# Patient Record
Sex: Male | Born: 1935 | Race: White | Hispanic: No | Marital: Married | Smoking: Former smoker
Health system: Southern US, Community
[De-identification: ages and names within clinical notes are randomized; demographics above are authoritative.]

## PROBLEM LIST (undated history)

## (undated) DIAGNOSIS — I714 Abdominal aortic aneurysm, without rupture, unspecified: Secondary | ICD-10-CM

## (undated) DIAGNOSIS — I10 Essential (primary) hypertension: Secondary | ICD-10-CM

## (undated) DIAGNOSIS — M109 Gout, unspecified: Secondary | ICD-10-CM

## (undated) DIAGNOSIS — E119 Type 2 diabetes mellitus without complications: Secondary | ICD-10-CM

## (undated) DIAGNOSIS — I2510 Atherosclerotic heart disease of native coronary artery without angina pectoris: Secondary | ICD-10-CM

## (undated) DIAGNOSIS — E785 Hyperlipidemia, unspecified: Secondary | ICD-10-CM

## (undated) DIAGNOSIS — I471 Supraventricular tachycardia, unspecified: Secondary | ICD-10-CM

## (undated) DIAGNOSIS — M609 Myositis, unspecified: Secondary | ICD-10-CM

## (undated) DIAGNOSIS — M810 Age-related osteoporosis without current pathological fracture: Secondary | ICD-10-CM

## (undated) DIAGNOSIS — M1990 Unspecified osteoarthritis, unspecified site: Secondary | ICD-10-CM

## (undated) HISTORY — DX: Gout, unspecified: M10.9

## (undated) HISTORY — PX: PROSTATE SURGERY: SHX751

## (undated) HISTORY — DX: Unspecified osteoarthritis, unspecified site: M19.90

## (undated) HISTORY — PX: CARDIAC SURGERY: SHX584

## (undated) HISTORY — DX: Age-related osteoporosis without current pathological fracture: M81.0

## (undated) HISTORY — DX: Supraventricular tachycardia: I47.1

## (undated) HISTORY — DX: Supraventricular tachycardia, unspecified: I47.10

## (undated) HISTORY — DX: Myositis, unspecified: M60.9

## (undated) HISTORY — PX: CORONARY STENT PLACEMENT: SHX1402

## (undated) HISTORY — DX: Hyperlipidemia, unspecified: E78.5

---

## 2004-07-04 ENCOUNTER — Inpatient Hospital Stay (HOSPITAL_COMMUNITY): Admission: AD | Admit: 2004-07-04 | Discharge: 2004-07-07 | Payer: Self-pay | Admitting: Internal Medicine

## 2004-07-04 ENCOUNTER — Ambulatory Visit: Payer: Self-pay | Admitting: Internal Medicine

## 2004-07-04 ENCOUNTER — Encounter: Payer: Self-pay | Admitting: Cardiology

## 2006-04-03 ENCOUNTER — Ambulatory Visit: Payer: Self-pay | Admitting: Cardiology

## 2006-04-03 ENCOUNTER — Inpatient Hospital Stay (HOSPITAL_COMMUNITY): Admission: AD | Admit: 2006-04-03 | Discharge: 2006-04-04 | Payer: Self-pay | Admitting: Cardiology

## 2007-04-08 ENCOUNTER — Encounter: Admission: RE | Admit: 2007-04-08 | Discharge: 2007-04-08 | Payer: Self-pay | Admitting: Specialist

## 2009-04-30 ENCOUNTER — Inpatient Hospital Stay (HOSPITAL_COMMUNITY): Admission: EM | Admit: 2009-04-30 | Discharge: 2009-05-03 | Payer: Self-pay | Admitting: Internal Medicine

## 2009-04-30 ENCOUNTER — Ambulatory Visit: Payer: Self-pay | Admitting: Internal Medicine

## 2009-06-02 ENCOUNTER — Ambulatory Visit: Payer: Self-pay | Admitting: Cardiovascular Disease

## 2009-08-04 ENCOUNTER — Ambulatory Visit: Payer: Self-pay | Admitting: Cardiovascular Disease

## 2009-10-23 ENCOUNTER — Encounter (INDEPENDENT_AMBULATORY_CARE_PROVIDER_SITE_OTHER): Payer: Self-pay | Admitting: *Deleted

## 2009-10-24 ENCOUNTER — Ambulatory Visit: Payer: Self-pay | Admitting: Cardiovascular Disease

## 2010-02-22 ENCOUNTER — Encounter: Payer: Self-pay | Admitting: Cardiovascular Disease

## 2010-04-19 ENCOUNTER — Ambulatory Visit: Payer: Self-pay | Admitting: Cardiovascular Disease

## 2010-08-29 NOTE — Miscellaneous (Signed)
Summary: Enrollment  Enrollment   Imported By: Marylou Mccoy 02/22/2010 12:39:54  _____________________________________________________________________  External Attachment:    Type:   Image     Comment:   External Document

## 2010-08-29 NOTE — Miscellaneous (Signed)
Summary: research update  Clinical Lists Changes  Observations: Added new observation of RS STUDY: SOLID TIMI 52 (10/23/2009 14:16) Added new observation of RESEARCHCAND: Cardiology (10/23/2009 14:16)      Research Study Name: SOLID TIMI 52

## 2010-10-12 ENCOUNTER — Encounter (INDEPENDENT_AMBULATORY_CARE_PROVIDER_SITE_OTHER): Payer: Self-pay

## 2010-10-12 DIAGNOSIS — R0989 Other specified symptoms and signs involving the circulatory and respiratory systems: Secondary | ICD-10-CM

## 2010-11-02 LAB — CARDIAC PANEL(CRET KIN+CKTOT+MB+TROPI)
CK, MB: 7.3 ng/mL — ABNORMAL HIGH (ref 0.3–4.0)
Relative Index: 5.6 — ABNORMAL HIGH (ref 0.0–2.5)
Total CK: 131 U/L (ref 7–232)
Total CK: 134 U/L (ref 7–232)
Troponin I: 0.03 ng/mL (ref 0.00–0.06)
Troponin I: 0.04 ng/mL (ref 0.00–0.06)

## 2010-11-02 LAB — CBC
HCT: 35.9 % — ABNORMAL LOW (ref 39.0–52.0)
HCT: 37.2 % — ABNORMAL LOW (ref 39.0–52.0)
HCT: 38.5 % — ABNORMAL LOW (ref 39.0–52.0)
Hemoglobin: 12.6 g/dL — ABNORMAL LOW (ref 13.0–17.0)
Hemoglobin: 12.8 g/dL — ABNORMAL LOW (ref 13.0–17.0)
Hemoglobin: 13.2 g/dL (ref 13.0–17.0)
Hemoglobin: 13.4 g/dL (ref 13.0–17.0)
MCHC: 34.5 g/dL (ref 30.0–36.0)
MCHC: 34.7 g/dL (ref 30.0–36.0)
MCHC: 35 g/dL (ref 30.0–36.0)
MCV: 100.3 fL — ABNORMAL HIGH (ref 78.0–100.0)
MCV: 100.6 fL — ABNORMAL HIGH (ref 78.0–100.0)
MCV: 99.9 fL (ref 78.0–100.0)
Platelets: 187 10*3/uL (ref 150–400)
Platelets: 199 10*3/uL (ref 150–400)
RBC: 3.57 MIL/uL — ABNORMAL LOW (ref 4.22–5.81)
RBC: 3.7 MIL/uL — ABNORMAL LOW (ref 4.22–5.81)
RBC: 3.79 MIL/uL — ABNORMAL LOW (ref 4.22–5.81)
RBC: 3.86 MIL/uL — ABNORMAL LOW (ref 4.22–5.81)
RDW: 12.1 % (ref 11.5–15.5)
RDW: 12.5 % (ref 11.5–15.5)
WBC: 6.3 10*3/uL (ref 4.0–10.5)
WBC: 6.8 10*3/uL (ref 4.0–10.5)
WBC: 7.1 10*3/uL (ref 4.0–10.5)
WBC: 8.3 10*3/uL (ref 4.0–10.5)

## 2010-11-02 LAB — HEMOGLOBIN A1C
Hgb A1c MFr Bld: 6.1 % (ref 4.6–6.1)
Mean Plasma Glucose: 128 mg/dL

## 2010-11-02 LAB — BASIC METABOLIC PANEL
BUN: 13 mg/dL (ref 6–23)
BUN: 13 mg/dL (ref 6–23)
CO2: 24 mEq/L (ref 19–32)
CO2: 25 mEq/L (ref 19–32)
CO2: 25 mEq/L (ref 19–32)
CO2: 26 mEq/L (ref 19–32)
Calcium: 9.1 mg/dL (ref 8.4–10.5)
Calcium: 9.2 mg/dL (ref 8.4–10.5)
Calcium: 9.3 mg/dL (ref 8.4–10.5)
Chloride: 105 mEq/L (ref 96–112)
Chloride: 105 mEq/L (ref 96–112)
Chloride: 106 mEq/L (ref 96–112)
Chloride: 109 mEq/L (ref 96–112)
Creatinine, Ser: 0.79 mg/dL (ref 0.4–1.5)
Creatinine, Ser: 0.83 mg/dL (ref 0.4–1.5)
GFR calc Af Amer: >60 mL/min (ref >60)
GFR calc Af Amer: >60 mL/min (ref >60)
GFR calc Af Amer: >60 mL/min (ref >60)
GFR calc non Af Amer: >60 mL/min (ref >60)
GFR calc non Af Amer: >60 mL/min (ref >60)
Glucose, Bld: 124 mg/dL — ABNORMAL HIGH (ref 70–99)
Glucose, Bld: 147 mg/dL — ABNORMAL HIGH (ref 70–99)
Glucose, Bld: 199 mg/dL — ABNORMAL HIGH (ref 70–99)
Potassium: 3.6 mEq/L (ref 3.5–5.1)
Potassium: 3.8 mEq/L (ref 3.5–5.1)
Potassium: 3.9 mEq/L (ref 3.5–5.1)
Sodium: 137 mEq/L (ref 135–145)
Sodium: 137 mEq/L (ref 135–145)
Sodium: 140 mEq/L (ref 135–145)

## 2010-11-02 LAB — LIPID PANEL
Cholesterol: 109 mg/dL (ref 0–200)
HDL: 23 mg/dL — ABNORMAL LOW (ref >39)
LDL Cholesterol: 62 mg/dL (ref 0–99)
Total CHOL/HDL Ratio: 4.7 RATIO
Triglycerides: 121 mg/dL (ref <150)

## 2010-11-02 LAB — HEPARIN LEVEL (UNFRACTIONATED)
Heparin Unfractionated: 0.49 IU/mL (ref 0.30–0.70)
Heparin Unfractionated: 0.56 IU/mL (ref 0.30–0.70)

## 2010-11-02 LAB — CK TOTAL AND CKMB (NOT AT ARMC)
CK, MB: 14.1 ng/mL — ABNORMAL HIGH (ref 0.3–4.0)
CK, MB: 17.8 ng/mL — ABNORMAL HIGH (ref 0.3–4.0)
Relative Index: 5.5 — ABNORMAL HIGH (ref 0.0–2.5)
Total CK: 323 U/L — ABNORMAL HIGH (ref 7–232)

## 2010-11-02 LAB — TROPONIN I: Troponin I: 0.01 ng/mL (ref 0.00–0.06)

## 2010-12-15 NOTE — Cardiovascular Report (Signed)
NAME:  BYNUM, MCCULLARS NO.:  [id number]   MEDICAL RECORD NO.:  [id number]          PATIENT TYPE:  INP   LOCATION:  2908                         FACILITY:  [facility]   PHYSICIAN:  Bevelyn Buckles. Bensimhon, MDDATE OF BIRTH:  February 21, 1935   DATE OF PROCEDURE:  04/03/2006  DATE OF DISCHARGE:                              CARDIAC CATHETERIZATION   PRIMARY CARE PHYSICIAN:  Dr. Feliciana Rossetti.   CARDIOLOGIST:  Dr. Lenard Galloway Dhatt.   PATIENT IDENTIFICATION:  Mr. Reust is a very pleasant 76 year old male  with multiple medical problems.  He has history of coronary artery disease  and underwent bypass grafting in 1988 by Dr. Particia Lather.  He was  admitted with unstable angina in December 2005 and was found to have tandem  lesions in the saphenous vein graft to the OM system.  He underwent stenting  of the ostial saphenous vein graft as well as the proximal saphenous vein  graft with a Cypher drug-eluting stents by Dr. Riley Kill.  He has done well  since that time.  He was readmitted with chest pinching similar to his  previous unstable angina.  Cardiac markers and EKG were nonrevealing.  Based  on the quality of his pain, he was brought to the catheterization lab.   PROCEDURES PERFORMED:  1. Selective coronary angiography.  2. Saphenous vein angiography.  3. LIMA angiography.  4. RIMA angiography.  5. Left heart cath.  6. Left ventriculogram.   DESCRIPTION OF PROCEDURE:  The risks and benefits of catheterization were  explained.  Consent was signed and placed on the chart.  A 6-French arterial  sheath was placed in the right femoral artery using a modified Seldinger  technique.  Standard catheters including JL-4, JR-4 and angled pigtail were  used for procedure.  All catheter exchanges made over wire.  There were no  apparent complications.  Of note, the LIMA was cannulated using the JR-4  catheter.  The RIMA was also cannulated using the JR-4 over a long exchange  wire.   Central aortic pressure 135/68 with a mean 94.  LV pressure 145/14 with an  EDP of 30.  There was no aortic stenosis.   ANGIOGRAPHY:  Left main was difficult to see.  There was significant  catheter damping with a 6-French catheter, a probable 50% ostial stenosis.   LAD had moderate diffuse disease throughout the proximal and mid-section.  It appeared to be totally occluded after the takeoff of two septal  perforators.   Left circumflex was totally occluded proximally after the takeoff of a small  ramus.  The ramus had diffuse disease with a focal 80-90% lesion and  distally.  This was unchanged from previous.  The right coronary artery was  heavily diseased proximally with focal 90% in the proximal portion and then  diffuse disease throughout the mid-section.  It was totally occluded in the  midsection after giving off an RV branch.   LIMA to the LAD was widely patent.  There was a 50% lesion in the LAD in the  distal portion after the anastomosis of the LIMA.   The saphenous vein  sequential graft to the OM-1/OM-2.  The two stents were  widely patent.  In the mid-section after the second stent, there was a focal  99% stenosis.  There was a 40% stenosis in the native OM-2 just after the  insertion of the vein graft.   The RIMA to the RCA was widely patent.  There was mild nonobstructive  disease in the distal RCA.  It gave off PDA and two small PLs.   Left ventriculogram done in the RAO position showed an EF of 60% with no  mitral regurgitation or wall motion abnormalities.   ASSESSMENT:  1. Severe native three-vessel disease as described above.  2. Left internal mammary artery to left anterior descending artery is      widely patent with 50-60% mid-left anterior descending artery lesion      after the anastomosis of the left internal mammary artery.  3. Left internal mammary artery to the right coronary artery is widely      patent.  4. The saphenous vein graft sequential to  the obtuse marginal-1/ obtuse      marginal-2 had 99% lesion in the body of the graft after the previously      placed stents which were widely patent.  5. Normal left ventricular function with an elevated left ventricular end-      diastolic pressure likely suggestive of diastolic dysfunction.   PLAN:  Will be to PCI of the saphenous vein graft to the OM system with Dr.  Samule Ohm.      Bevelyn Buckles. Bensimhon, MD  Electronically Signed     DRB/MEDQ  D:  04/03/2006  T:  04/03/2006  Job:  [id number]

## 2010-12-15 NOTE — Discharge Summary (Signed)
NAME:  Noah Mahoney, Noah Mahoney              ACCOUNT NO.:  [id number]   MEDICAL RECORD NO.:  [id number]          PATIENT TYPE:  INP   LOCATION:  6526                         FACILITY:  [facility]   PHYSICIAN:  Arvilla Meres, M.D. LHCDATE OF BIRTH:  1934-09-14   DATE OF ADMISSION:  07/04/2004  DATE OF DISCHARGE:  07/07/2004                                 DISCHARGE SUMMARY   DISCHARGE DIAGNOSIS:  1.  Non-ST elevated myocardial infarction status post cardiac      catheterization status post cardiac Cypher stents to the saphenous vein      graft, distal and mid first obtuse marginal, patent internal mammary      artery to the left anterior descending, patent right internal mammary      artery to the right coronary artery, normal left ventricle.  2.  History of coronary artery disease status post coronary artery bypass      grafting in 1986.  3.  Hypertension.  4.  Gastroesophageal reflux disease.  5.  Pre-diabetic.  6.  Diverticulosis, history of colon polyps.  7.  Benign prostatic hypertrophy with biopsies and microwave treatment of      benign prostatic hypertrophy.  8.  Questionable history of myositis, he has had multiple muscle biopsies      which have been inconclusive.   HOSPITAL COURSE:  The patient was transferred to [facility] from  [facility] for further evaluation of cardiac discomfort x several  weeks.  Echocardiogram performed on December 6 showed overall left  ventricular systolic function normal with left ventricular ejection fraction  55-65%.  The study was inaccurate for evaluation of left ventricular  regional wall motion, there was mild focal basal septal hypertrophy.  Carotid duplex also done this admission for bilateral carotid bruits with  results showing no 60-80% internal carotid artery stenosis bilateral.  Patient to cardiac catheterization lab on December 7 by Dr. Riley Kill, results  as noted above.  The patient tolerated the procedure without any  complications.  Post cath 24 hour observation.   Labs this admission initial cardiac panel on December 6 showed troponin  0.03, CK MB 9.1, index 4.9, with total CK of 187.  Urinalysis negative.  TSH  0.994.  Lipid profile fasting total cholesterol 151, triglycerides 173, HDL  24, LDL 92.  CBC with hemoglobin 14, hematocrit 39.5, platelet count  219,000.  Post cath BMP with sodium 141, potassium 3.8, BUN 15, creatinine  1.  CBC post cath showed hemoglobin 13, hematocrit 37.1, platelet count  221,000.   Dr. Gala Romney in to see the patient on the morning of December 9, patient  doing well without complaints, vital signs stable.  Lungs are clear.  Cath  site with no hematoma, negative for bruit.  Stable for discharge post  intervention.  Plans are to continue Plavix, follow up with Dr. Sherlyn Lick in  [place].  Consider Niaspan for decreased HDL.   DISPOSITION:  Home.   DISCHARGE MEDICATIONS:  Plavix 75 mg daily, coated aspirin 325 mg daily,  Lisinopril 25 mg daily, Ziac 5 mg daily, Nitrostat sublingual as needed.   DISCHARGE INSTRUCTIONS:  Activity:  No strenuous activity or driving until  see by doctor.  Diet:  Low fat, low cholesterol.  Call the office for any  groin swelling or bleeding.  Follow up Dr. Sherlyn Lick in two weeks, call his  office for appointment.  Patient discharged by Dr. Gala Romney.      [place]   MB/MEDQ  D:  07/07/2004  T:  07/07/2004  Job:  [id number]   cc:   Feliciana Rossetti, MD  [address]  [place]  [place] [address]  Fax: [phone]   Harl Bowie, M.D.  [address]  [place]  [place] [address]  Fax: [phone]

## 2010-12-15 NOTE — H&P (Signed)
NAME:  Noah Mahoney, Noah Mahoney NO.:  [id number]   MEDICAL RECORD NO.:  [id number]          PATIENT TYPE:  INP   LOCATION:  4709                         FACILITY:  [facility]   PHYSICIAN:  Arvilla Meres, M.D. LHCDATE OF BIRTH:  12/30/1934   DATE OF ADMISSION:  07/04/2004  DATE OF DISCHARGE:                                HISTORY & PHYSICAL   HISTORY OF PRESENT ILLNESS:  Noah Mahoney is a 76 year old white male who was  transferred from [facility] via [facility] for further cardiac  evaluation.  He describes several different types of chest discomfort that  have been going on for the last three weeks.  The first is exertional and he  describes it as a bilateral shoulder pinching sensation relieved with rest,  unassociated with shortness of breath, nausea, vomiting, or diaphoresis.  Last week, he feels that this pinching sensation became a lot worse to the  point he described it as a left shoulder hurting radiating into the back of  his neck and across the chest.  He recalls three particularly severe  episodes which have occurred at rest and he gives a 9 to 10 on a scale of 0  to 10.  He would relax and take deep breaths with relief within less than  five minutes.  Again, he denies any associated nausea, vomiting,  diaphoresis, or shortness of breath.  He also describes an anterior chest  burning sensation while walking on the treadmill.  The last occurrence was  three days ago relieved with rest.  He does not have any associated symptoms  with this discomfort either.  He feels that the left shoulder hurting  sensation somewhat resembles prior to bypass but is also different because  he is not having the GI symptoms.   He called his primary care physician yesterday who referred him to [facility].  At [facility], he was admitted.  His EKGs showed sinus  bradycardia, normal axis and normal intervals, some artifacts, and early R-  waves but no acute changes.   He did not have any old EKGs available for  comparison.   His labs over at [facility] show an H&H of 15.2 and 23.9 with an  elevated MCV and MCHC, platelets 288,000.  WBC is 6.6.  Sodium 139,  potassium 4.2, BUN 14, creatinine 0.9, glucose 115.  Normal LFTs.  Amylase  51, lipase 247.  PTT 32, PT 11.7.  His initial CK total is 307 with a MB of  23.7 and a relative index of 7.7.  Continued CK-MBs and relative indexes  were elevated; however, troponins were within normal limits at 0.02, 0.04,  and 0.05.   ALLERGIES:  No known drug allergies.   MEDICATIONS:  1.  Lisinopril 40 mg q.d.  2.  Bisoprolol/HCTZ 5/6.25 mg q.d.  3.  Aspirin 325 mg q.d.  4.  Vitamin B12 q.d.  5.  Herbal preparation.   TRANSFER MEDICATIONS:  1.  Enteric-coated aspirin 325 mg q.d.  2.  Lisinopril 20 mg q.d.  3.  Protonix 40 mg q.d.  4.  Nitroglycerin 1/2 inch paste q.6h.  5.  Ziac 5 mg q.d.  6.  Lopid 480 mg q.h.s.  7.  IV heparin.   PAST MEDICAL HISTORY:  Bypass surgery in 1988.  This was performed on January 18, 1987 by Dr. Delsa Grana. Wilson.  She received to the LAD, saphenous vein  graft to the OM and a ramus, and a RIMA to the RCA.  Catheterization prior  to his bypass showed a normal LV function.  His history is also notable for  hypertension for the proceeding four years.  GERD, diverticulosis with colon  polyp removal and his last colonoscopy in April 2004.  He is prediabetic.  He also has a history of BPH and he has undergone biopsies and microwave  treatment.  He has a left lipoma.  Questionable history of myositis.  He has  had multiple muscle biopsies which have been inconclusive.  He has not seen  a cardiologist that he knows of since his bypass surgery 1988.   SOCIAL HISTORY:  He resides in [place] with his wife, Britta Mccreedy.  He is  retired from [employer].  He remains very active around the house and  with yard work.  He does not have any children.  He quit smoking in 1987.  Prior to  that, he smoked two packs a day for unknown amount of years.  He  denies any alcohol, drugs, specific diet, or regular exercise program.   FAMILY HISTORY:  His mother died at the age of 43 of unknown reasons.  She  has a history of lung problems and goiter.  Father is age 16 with a history  of hypertension.  There are 12 siblings-3 brothers are deceased, 5 sisters  are deceased, 2 brothers are living, and 1 sister is living.  He is not sure  of specific medical problems.   REVIEW OF SYMPTOMS:  Notable for a 10 to 15 pound weight gain for the last 1-  2 years.  Dentures, glasses, sinus drainage, lipoma in the left occipital  region.  Questionable old bilateral hip claudication, cough with sinus  production.  Arthralgias in the back, neck, and hip.   PHYSICAL EXAMINATION:  GENERAL:  Well-developed, well-nourished, pleasant,  slightly obese white male in no apparent distress.  Wife and brother are  present.  HEENT:  Essentially unremarkable except for glasses, dentures, and a lipoma  behind the left ear.  NECK:  Supple without thyromegaly, adenopathy, or JVD.  He does have  bilateral carotid bruits.  CHEST:  Symmetrical excursion.  Lungs were clear to auscultation with  decreased breath sounds.  HEART:  Distant.  PMI was not displaced.  Regular rate and rhythm with a  normal S1 and S2.  He does have a 1-2/6 systolic murmur best appreciated at  the left sternal border.  SKIN:  Intact.  ABDOMEN:  Obese.  Bowel sounds present without organomegaly, masses, or  tenderness.  EXTREMITIES:  Negative clubbing, cyanosis, or edema.  PULSES:  Peripheral pulses are intact.  MUSCULOSKELETAL:  Unremarkable.  NEUROLOGICAL:  Intact.   IMPRESSION:  1.  Multiple sites of chest discomfort, unclear of etiology by history.      Electrocardiograms and troponins are negative for myocardial infarction;     however, abnormal CK-MBs are suspicious and concerning for angina.  2.  Hypertension.  3.   Obesity.  4.  Remote tobacco use.  5.  History of as mentioned in previous past medical history.   PLAN:  Admit the patient.  Continue him on his  transfer medications.  We  will also check fasting lipids, TSH, and a hemoglobin A1C, and remind him of  cardiac risk factor modification.  We will not begin a beta blocker with his  sinus bradycardia.  Dr. Arvilla Meres reviewed the patient's history,  spoke with and examined the patient.  He feels tha tgiven the patients pain  and elevated cardiac markers, he should undergo cardiac catheterization and  this  has been arranged for July 05, 2004.  The procedure, risks, and benefits  have been explained to the patient and his wife.  With this systolic murmur  and bilateral carotid bruits, we will obtain echocardiogram and carotid  ultrasound.      Emil   EW/MEDQ  D:  07/04/2004  T:  07/04/2004  Job:  [id number]   cc:   Feliciana Rossetti, MD  [address]  [place]  [place] [address]  Fax: [phone]   Malkiat Dhatt, M.D.  [address]  [place]  [place] [address]  Fax: [phone]   [facility]  [address]  [place]  [place] [address]  Fax: [phone]

## 2010-12-15 NOTE — Cardiovascular Report (Signed)
NAME:  Noah Mahoney, Noah Mahoney NO.:  [id number]   MEDICAL RECORD NO.:  [id number]          PATIENT TYPE:  INP   LOCATION:  2908                         FACILITY:  [facility]   PHYSICIAN:  Salvadore Farber, MD  DATE OF BIRTH:  Mar 12, 1935   DATE OF PROCEDURE:  04/03/2006  DATE OF DISCHARGE:                              CARDIAC CATHETERIZATION   PROCEDURE:  Placement of two drug-eluting stents in the mid portion of the  saphenous vein graft to the obtuse marginal using Spider Filter Embolic  Protection.   INDICATIONS:  Mr. Chouinard is a 76 year old gentleman status post coronary  artery bypass grafting in 1987.  He has had prior drug-eluting stent  placement in the vein graft to the marginal in 2005.  He now presents with  unstable angina.  Electrocardiogram demonstrated nonspecific ST-T  abnormalities.  CK-MB was elevated consistent with a prior diagnosis of  myositis.  However, troponins were normal.  He underwent diagnostic  angiography by Dr. Gala Romney.  That demonstrated a 99% stenosis in the  saphenous vein graft to the marginal.  This was outside the previously  stented area.  Both of the previously placed stents are widely patent.  I  was asked to proceed to percutaneous revascularization.   PROCEDURAL TECHNIQUE:  Informed consented had been obtained prior to the  diagnostic procedure and was confirmed prior to proceeding with the  intervention.  The patient had received 1 mg/kg of subcutaneous Lovenox 8  hours and 15 minutes prior to the beginning of the intervention.  I  therefore administered an additional bolus of 0.3 mg/kg IV.  After adequate  circulation time, we exchanged the sheath for a 7-French sheath in the right  groin.  I advanced a 7-French AL-1 guiding catheter over a wire and engaged  it in the ostium of the vein graft to the marginal.  I then advanced a  Prowater wire across the lesion into the distal portion of the graft without  difficulty.  I then  advanced the Spider Filter Wire Delivery Catheter over  this wire and deployed the Spider Filter in the distal graft.  Apposition  was confirmed by angiography.  I then directly stented the lesion using a  3.0 x 18-mm Cypher deployed at 16 atmospheres.  Repeat angiography  demonstrated a distal edge dissection.  I covered this with an overlapping  3.0 x 13-mm Cypher deployed at 14 atmospheres.  I then post dilated the  distal portion of the stent using a 3.25 x 18-mm PowerSail at 16 atmospheres  at the distal margin, 18 atmospheres throughout the mid portion of the stent  including the region of overlap and 16 atmospheres at the proximal margin.  I then removed the Spider Filter without difficulty.  Repeat angiography  demonstrated no residual stenosis, no residual dissection, no embolization,  and TIMI 3 flow to the distal vasculature.  The patient tolerated the  procedure well and was transferred to the holding room in stable condition.   COMPLICATIONS:  None.   IMPRESSION/PLAN:  Successful revascularization of the vein graft to the  marginal using 2 drug-eluting stents  and Spider Filter Embolic Protection.  The patient will be maintained on both aspirin and Plavix indefinitely given  his multiple drug-eluting stents.      Salvadore Farber, MD  Electronically Signed     WED/MEDQ  D:  04/03/2006  T:  04/04/2006  Job:  [id number]   cc:   Feliciana Rossetti, MD  Harl Bowie, M.D.

## 2010-12-15 NOTE — Cardiovascular Report (Signed)
NAME:  Noah Mahoney, Noah Mahoney              ACCOUNT NO.:  [id number]   MEDICAL RECORD NO.:  [id number]          PATIENT TYPE:  INP   LOCATION:  6526                         FACILITY:  [facility]   PHYSICIAN:  Noah Mahoney, M.D. [facility] BIRTH:  [birth year]   DATE OF PROCEDURE:  DATE OF DISCHARGE:  07/07/2004                              CARDIAC CATHETERIZATION   INDICATIONS:  Noah Mahoney is a 76 year old gentleman who in 1988 underwent  coronary artery bypass graft surgery by Noah Mahoney.  At that time he  had an internal mammary to the LAD, saphenous vein graft to the OM and  intermediate, and right internal mammary to the right coronary artery.  He  has done well, but presented with a non ST elevation MI.  He was referred to  the catheterization laboratory for further evaluation and treatment.   PROCEDURE:  1.  Left heart catheterization.  2.  Selective coronary arteriography.  3.  Selective left ventriculography.  4.  Saphenous vein graft angiography.  5.  Selective left internal mammary angiography.  6.  Subclavian right angiography.  7.  Percutaneous stenting of the saphenous vein graft to the intermediate      and obtuse marginal.   DESCRIPTION OF PROCEDURE:  The patient was brought to the catheterization  laboratory and prepped and draped in the usual fashion.  Through an anterior  puncture the femoral artery was entered.  Views of the left and right  coronary arteries were obtained in multiple angiographic projections.  Vein  graft and internal mammary angiography of the left internal mammary was  performed without complication.  We had some difficulty actually getting  into the right internal mammary and multiple attempts were made to try to  engage this.  The patient had some ongoing chest pain and the study had  demonstrated that he had high grade stenosis in the saphenous vein graft to  the intermediate and OM.  There was also fairly vigorous flow down the right  internal mammary into the distal vessel and this was well seen, although we  could not see good details of the distal right coronary circulation.  Decision was made to proceed on to percutaneous intervention.  The patient  was given bivalirudin in appropriate dosing.  A filter wire was employed  with a JR4 guiding catheter with side holes.  We were able to get the filter  wire into the distal vein graft.  The high grade 95% stenosis was stented  using a 3.0 x 18 Cypher drug-eluting stent at the ostium or just inside the  ostium.  The 50-70% area of irregularity at the ostium was stented using a  3.0 x 13 Cypher drug-eluting stent.  He tolerated both of these well.  We  subsequently dilated using a 3.5 x 8 Quantum and both of the stents were  post dilated because they appeared to be slightly under sized.  There was  marked improvement in the appearance of the artery but there was no evidence  of no or slow flow during the course of the procedure.  The recovery  catheter was used  to remove the filter wire without complication.  We then  went back in with a guiding catheter to try to take additional views of the  right internal mammary artery but subsequently this was abandoned after a  couple of images.  The patient tolerated the procedure well and there were  no complications.   HEMODYNAMIC DATA:  1.  Central aortic pressure 169/74.  2.  Left ventricular pressure 184/29.  3.  There did not appear to be a significant gradient on pullback across the      aortic valve but there was variation in the pressure during a pullback.   ANGIOGRAPHIC DATA:  1.  Ventriculography in the RAO projection reveals vigorous global systolic      function.  No definite wall motion abnormalities were seen.  2.  The left main coronary is free of critical disease.  3.  The LAD is severely diseased, although is not well seen distally.  4.  The internal mammary to the distal left anterior descending artery is       widely patent.  5.  There is a tiny ramus intermedius that has 90% mid stenosis and is      severely diseased.  The AV circumflex is totally occluded.  6.  The saphenous vein graft two branches of the circumflex which represent      the intermediate and OM on the original report demonstrates a 50-70%      irregular area just inside the ostium and then a 90-95% stenosis      approximately 30 mm forward.  Following percutaneous stenting both of      these areas were reduced to 0% in residual luminal narrowing.  There was      minimal irregularity distally and the insertion sites appeared to be      good in both the intermediate and OM.  7.  The right coronary artery is severely diseased proximally with 80-90%      narrowing and then is subtotally occluded with what appears to be      competitive flow distally.  8.  The right internal mammary to the distal right circulation appears to      have vigorous flow.  We do not see the distal right coronary circulation      well but there does appear to be a vigorous flow into the distal right      circulation.   CONCLUSIONS:  1.  Well preserved left ventricular function.  2.  Successful percutaneous stenting of the saphenous vein graft to the      intermediate and obtuse marginal with resolution of the patient's      symptoms.  3.  Continued patency of the internal mammary to the left anterior      descending.  4.  Patency of the right internal mammary to the distal right circulation,      although the distal right circulation is not well seen on the      angiographic study because of difficulty engaging during this particular      study.   DISPOSITION:  The patient will need aspirin and Plavix.  We would favor  longer duration given the patient's saphenous vein grafts.  Stress  Cardiolite imaging would be worthwhile in approximately two months.  The  patient tolerated the procedure well and had resolution of his symptoms. The flow into the  distal right circulation appeared to be fairly vigorous.  He will be treated medically and will have follow-up  with Noah Mahoney.       TDS/MEDQ  D:  07/13/2004  T:  07/13/2004  Job:  [id number]   cc:   Harl Bowie, M.D.  [address]  [place]  [place] [address]  Fax: [phone]   [facility] Lab   Delsa Grana. Andrey Campanile, M.D.  [address]  [place]  [place] [address]  Fax: [phone]

## 2010-12-15 NOTE — Discharge Summary (Signed)
NAME:  Noah Mahoney, Noah Mahoney NO.:  [id number]   MEDICAL RECORD NO.:  [id number]          PATIENT TYPE:  INP   LOCATION:  2908                         FACILITY:  [facility]   PHYSICIAN:  Bevelyn Buckles. Bensimhon, MDDATE OF BIRTH:  02-13-1935   DATE OF ADMISSION:  DATE OF DISCHARGE:                                 DISCHARGE SUMMARY   PRIMARY CARDIOLOGIST:  Dr. Sherlyn Lick in [place].   PRINCIPAL DIAGNOSIS:  Acute coronary syndrome/coronary artery disease.   SECONDARY DIAGNOSES:  1. Hypertension.  2. Hyperlipidemia.  3. Diverticulosis.  4. Benign prostatic hypertrophy.  5. History of inflammatory myopathy.   ALLERGIES:  NO KNOWN DRUG ALLERGIES.   PROCEDURES:  1. Left heart cardiac catheterization, successful.  2. PCI and stenting of the vein graft to the obtuse marginal, with      placement of 2 cipher drug-eluting stents.   HISTORY OF PRESENT ILLNESS:  A 76 year old white male with prior history of  CAD status post CABG in 1987, with subsequent stenting of the vein graft to  the obtuse marginal, with drug-eluting stents in 2005.  Patient was in his  usual state of health until several days prior to admission, when he began  to experience exertional left shoulder pain  with radiation to the neck  occurring intermittently, and generally relieved with rest.  Due to  continuation of symptoms, he presented to [facility] on April 02, 2006, where ECG was without significant ST-T changes, and he was noted to  have elevated CKs and MBs with normal troponins.  The decision was made to  transfer him to [facility] for further evaluation.   HOSPITAL COURSE:  He underwent left heart cardiac catheterization on  April 03, 2006, revealing a patent LIMA to the LAD as well as a patent  right internal mammary artery to the right coronary artery.  The sequential  vein graft to the first and second obtuse marginals had patent stents in the  ostium and proximal portion of the conduit.   However, there was a 99%  stenosis just proximal to the anastomosis at the OM-1.  Films were reviewed  with Dr. Samule Ohm, and he subsequently underwent successful PCI and stenting  of his mid vein graft to the OM-1/OM-2, with placement of a 3.0 x 18-mm  cipher drug-eluting stent, as well as a 3.0 x 13-mm cipher drug-eluting  stent.  Patient tolerated this procedure well and had a mild elevation of  his troponin to 0.35 post-procedure, but otherwise has been ambulating  without difficulty or limitations.  He is being discharged home today in  satisfactory condition.   DISCHARGE LABORATORIES:  Hemoglobin 12.8, hematocrit 36.2, WBC 6.1,  platelets 195, MCV 99.0, sodium 140,  potassium 3.6, chloride 108, CO2 of  25, BUN 13, creatinine 0.9, glucose 112, PT 15.4, INR 1.2, PTT 60, total  bilirubin 1.1, alkaline phosphatase 46, AST 33, ALT 31, albumin 3.2, CK 186,  MB 8.9, troponin 90.35, calcium 9.3.   DISPOSITION:  Patient is being discharged home today in good condition.   FOLLOWUP PLANS AND APPOINTMENTS:  He is asked to follow up  with Dr. Sherlyn Lick in  [place] in 1-2 weeks.  He is asked to follow up with Dr. Shary Decamp in 3-4  weeks.   DISCHARGE MEDICATIONS:  1. Aspirin 325 mg every day.  2. Plavix 75 mg every day.  3. Lipitor 80 mg nightly.  4. Fenofibrate 130 mg nightly.  5. Lisinopril 20 mg every day.  6. Toprol-XL 25 mg every day.  7. Fish oil 1000 mg every day.  8. Nitroglycerin 0.4 mg sublingual p.r.n. chest pain.   OUTSTANDING LABORATORY STUDIES:  None.   DURATION OF DISCHARGE ENCOUNTER:  Discharge encounter 40 minutes, including  physician time.     ______________________________  Nicolasa Ducking, ANP      Bevelyn Buckles. Bensimhon, MD  Electronically Signed    CB/MEDQ  D:  04/04/2006  T:  04/04/2006  Job:  [id number]   cc:   Feliciana Rossetti, MD  Harl Bowie, M.D.

## 2010-12-15 NOTE — H&P (Signed)
NAME:  Noah Mahoney, Noah Mahoney NO.:  [id number]   MEDICAL RECORD NO.:  [id number]          PATIENT TYPE:  INP   LOCATION:  2908                         FACILITY:  [facility]   PHYSICIAN:  Jonelle Sidle, MD DATE OF BIRTH:  01/14/1935   DATE OF ADMISSION:  04/03/2006  DATE OF DISCHARGE:                                HISTORY & PHYSICAL   CHIEF COMPLAINT:  Nagging left shoulder pain.   HISTORY OF PRESENT ILLNESS:  Mr. Noah Mahoney is a 76 year old Caucasian  gentleman with past medical history significant for four vessel CABG in 1987  performed by Dr. Andrey Campanile with cardiac catheterization December 2005 showing  stenosis with two stent placement.  At that time, in December 2005, the  patient presented with pinched left shoulder pain and subsequently found to  have the stenosis by catheterization.  He presents to the emergency room at  [facility] after experiencing left shoulder pain with radiation to  the neck starting on Friday, March 29, 2006.  The patient states that over  the course of the next few days the pain would come and go with no  significant change with nitroglycerin or NSAID relief.  Over the course of  the last few days, pain was intensifying such that it was worsening with  rest, although, most significant with minimal exertion to include walking.  He was seen in the emergency room last night at [facility] with  cardiac isoenzymes drawn with only elevation to the CK and CK MB fractions  with no elevation to troponin or EKG changes.  He has been transferred to  this hospital for further evaluation.   PAST MEDICAL HISTORY:  1. Coronary artery bypass graft x4 vessel in June 1988 of June by Dr.      Andrey Campanile which included LIMA to the LAD, SVG to OM-Int and Ramus, RIMA to      RCA.  ---Status post cardiac cath December 2005 for NSTEMI with two drug eluting  stents(Cypher) placed in the SVG to the ramus, 3.0x18 and 3.0x13  respectively for graft stenosis  1. Hypertension.  2. Dyslipidemia.  3. History of diverticulosis.  4. History of benign prostatic hypertrophy with history of microwave      treatment.  5. History of unknown inflammatory Myopathy/Myositis status post muscle      biopsy without clear diagnosis 2 yrs ago.   ALLERGIES:  No known drug allergies.   MEDICATIONS:  1. Plavix 75 mg daily.  2. Fenofibrate 130 mg daily.  3. Toprol XL 25 mg daily.  4. Lisinopril 20 mg daily.  5. Aspirin 325 mg daily.  6. Lipitor 80 mg daily.  7. HCTZ 12.5 mg daily.  8. Fish oil one tablet daily.   ALLERGIES:   FAMILY HISTORY:  The patient's mother died at the age of 68 with history of  goiter, but unknown reason for death; father died at the age of 80, unknown  reason for death; 33 total siblings with only one brother and one sister  still remaining, alive.  History of aortic aneurysm in one sister.   SOCIAL HISTORY:  The patient  is currently retired from plumbing for about  four years.  He is an ex-smoker.  Started at the age of 9 and smoked until  his coronary artery bypass graft in 1987 with a two pack per day history  which equaled 30+ years.  Denies alcohol or illicit drug use.   REVIEW OF SYSTEMS:  GENERAL:  The patient denies any fever, chills, night  sweats or weight changes.  RESPIRATORY:  Denies sputum, shortness of breath  or wheezing.  CARDIOVASCULAR:  Denies any significant chest pain,  palpitations.  GASTROINTESTINAL:  Denies any nausea, vomiting or diarrhea.  SKIN/INTEGUMENTARY:  The patient denies any new rashes.  MUSCULOSKELETAL:  Chronic low back pain with mild sciatica, but otherwise, no new change.   LABORATORY DATA:  Obtained at [facility], CK total 528 on first set,  398 second set, 352 third set.  CK MB 22.7 first set, 12.9 second set, 11.3  third set.  Troponin negative x3.   Sodium 140, potassium 3.7, chloride 108, bicarbonate 24, BUN 20, creatinine  1.1, glucose 153, AST 44, ALT 55, alk-phos 99, total  bilirubin 0.3, total  cholesterol 110, HDL 21, LDL 70.7, triglyceride 91, hemoglobin 34.4,  hematocrit 38.2, platelets 221,000, white blood cell count 6.9 with an MCV  of 98, PT 10.2, PTT 32.4, INR 1.0.   IMAGING:  A 12-lead EKG at [facility] in the ER demonstrated sinus  bradycardia with nonspecific STT changes to the lateral leads.   A 2D echocardiogram in December 2005 demonstrated ejection fraction of 55-  65% with basal septal hypertrophy and trivial mitral regurgitation.   PHYSICAL EXAMINATION:  VITAL SIGNS:  Temperature 97.3, blood pressure  122/70, pulse 52, respirations 18 with O2 saturation 99% on 2 liters.  GENERAL APPEARANCE:  The patient was alert and oriented x4, very pleasant  and cooperative.  HEENT:  Bilateral dentures with no lesions appreciated.  There was no  palpable thyromegaly.  No adenopathy to the submandibular, supraclavicular  or cervical regions.  Pupils equal, round and reactive to light and  accommodation.  Extraocular movements intact.  Sclerae were nonicteric.  RESPIRATORY:  Decreased breath sounds throughout all lung fields but no  appreciable wheezes, rales or rhonchi.  CARDIOVASCULAR:  Bradycardic rhythm with normal S1, S2.  No appreciable  murmurs, rubs or gallops.  No bruits noted to the carotids or abdomen.  GASTROINTESTINAL:  Abdomen was soft, nontender, nondistended with no  hepatosplenomegaly.  EXTREMITIES:  No edema.  There was an old scar to the right leg where the  saphenous vein graft is harvested.   ASSESSMENT/PLAN:  1. Unstable angina with atypical features.  Given the patient's similar      symptoms to his December 2005 presentation for which he had a NSTEMI      with catheterization and stent placement, we will proceed with cardiac      catheterization at this time to reevaluate patency of these grafts.     The patient already received 1 mg/kg of Lovenox at [facility] at      0600hrs today.  At this time, we will proceed  with cath with possible      PCI.  2. Dyslipidemia.  The patient will be continued on Lipitor and      fenofibrate.  No need to repeat lipid studies at this time.  3. History of inflammatory myopathy, unknown etiology.  Will try to      determine muscle biopsy results, perhaps check ESR and Aldolase if  current evaluation is nondiagnostic.   Case d/w Attending MD-Dr. Sabas Sous, M.D.  Electronically Signed      Jonelle Sidle, MD  Electronically Signed    FR/MEDQ  D:  04/03/2006  T:  04/03/2006  Job:  [id number]

## 2011-04-23 ENCOUNTER — Encounter (INDEPENDENT_AMBULATORY_CARE_PROVIDER_SITE_OTHER): Payer: Self-pay

## 2011-04-23 DIAGNOSIS — R0989 Other specified symptoms and signs involving the circulatory and respiratory systems: Secondary | ICD-10-CM

## 2012-01-29 ENCOUNTER — Encounter (INDEPENDENT_AMBULATORY_CARE_PROVIDER_SITE_OTHER): Payer: Self-pay

## 2012-01-29 DIAGNOSIS — R0989 Other specified symptoms and signs involving the circulatory and respiratory systems: Secondary | ICD-10-CM

## 2012-08-12 ENCOUNTER — Other Ambulatory Visit: Payer: Self-pay

## 2012-08-12 ENCOUNTER — Inpatient Hospital Stay (HOSPITAL_COMMUNITY)
Admission: EM | Admit: 2012-08-12 | Discharge: 2012-08-14 | DRG: 251 | Disposition: A | Discharge status: Home / Self Care | Payer: Medicare Other | Attending: Cardiovascular Disease | Admitting: Cardiovascular Disease

## 2012-08-12 ENCOUNTER — Encounter (HOSPITAL_COMMUNITY): Payer: Self-pay | Admitting: *Deleted

## 2012-08-12 ENCOUNTER — Emergency Department (HOSPITAL_COMMUNITY): Payer: Medicare Other

## 2012-08-12 DIAGNOSIS — E785 Hyperlipidemia, unspecified: Secondary | ICD-10-CM | POA: Diagnosis present

## 2012-08-12 DIAGNOSIS — Z7982 Long term (current) use of aspirin: Secondary | ICD-10-CM

## 2012-08-12 DIAGNOSIS — Z79899 Other long term (current) drug therapy: Secondary | ICD-10-CM

## 2012-08-12 DIAGNOSIS — I2510 Atherosclerotic heart disease of native coronary artery without angina pectoris: Secondary | ICD-10-CM | POA: Diagnosis present

## 2012-08-12 DIAGNOSIS — Z7902 Long term (current) use of antithrombotics/antiplatelets: Secondary | ICD-10-CM

## 2012-08-12 DIAGNOSIS — R079 Chest pain, unspecified: Secondary | ICD-10-CM

## 2012-08-12 DIAGNOSIS — E876 Hypokalemia: Secondary | ICD-10-CM | POA: Diagnosis present

## 2012-08-12 DIAGNOSIS — T82897A Other specified complication of cardiac prosthetic devices, implants and grafts, initial encounter: Secondary | ICD-10-CM | POA: Diagnosis present

## 2012-08-12 DIAGNOSIS — Z951 Presence of aortocoronary bypass graft: Secondary | ICD-10-CM

## 2012-08-12 DIAGNOSIS — Z87891 Personal history of nicotine dependence: Secondary | ICD-10-CM

## 2012-08-12 DIAGNOSIS — I252 Old myocardial infarction: Secondary | ICD-10-CM

## 2012-08-12 DIAGNOSIS — I714 Abdominal aortic aneurysm, without rupture, unspecified: Secondary | ICD-10-CM | POA: Diagnosis present

## 2012-08-12 DIAGNOSIS — I10 Essential (primary) hypertension: Secondary | ICD-10-CM | POA: Diagnosis present

## 2012-08-12 DIAGNOSIS — Y849 Medical procedure, unspecified as the cause of abnormal reaction of the patient, or of later complication, without mention of misadventure at the time of the procedure: Secondary | ICD-10-CM | POA: Diagnosis present

## 2012-08-12 DIAGNOSIS — I2582 Chronic total occlusion of coronary artery: Secondary | ICD-10-CM | POA: Diagnosis present

## 2012-08-12 DIAGNOSIS — I214 Non-ST elevation (NSTEMI) myocardial infarction: Principal | ICD-10-CM | POA: Diagnosis present

## 2012-08-12 DIAGNOSIS — E119 Type 2 diabetes mellitus without complications: Secondary | ICD-10-CM | POA: Diagnosis present

## 2012-08-12 HISTORY — DX: Atherosclerotic heart disease of native coronary artery without angina pectoris: I25.10

## 2012-08-12 HISTORY — DX: Abdominal aortic aneurysm, without rupture, unspecified: I71.40

## 2012-08-12 HISTORY — DX: Hyperlipidemia, unspecified: E78.5

## 2012-08-12 HISTORY — DX: Type 2 diabetes mellitus without complications: E11.9

## 2012-08-12 HISTORY — DX: Essential (primary) hypertension: I10

## 2012-08-12 HISTORY — DX: Abdominal aortic aneurysm, without rupture: I71.4

## 2012-08-12 LAB — POCT I-STAT TROPONIN I: Troponin i, poc: 0.07 ng/mL (ref 0.00–0.08)

## 2012-08-12 LAB — COMPREHENSIVE METABOLIC PANEL
ALT: 19 U/L (ref 0–53)
Albumin: 4 g/dL (ref 3.5–5.2)
Alkaline Phosphatase: 85 U/L (ref 39–117)
Calcium: 10.1 mg/dL (ref 8.4–10.5)
Potassium: 3.7 mEq/L (ref 3.5–5.1)
Sodium: 137 mEq/L (ref 135–145)
Total Protein: 7.3 g/dL (ref 6.0–8.3)

## 2012-08-12 LAB — CBC
MCH: 34.3 pg — ABNORMAL HIGH (ref 26.0–34.0)
MCHC: 35.6 g/dL (ref 30.0–36.0)
RDW: 12.1 % (ref 11.5–15.5)

## 2012-08-12 LAB — TROPONIN I: Troponin I: <0.3 ng/mL (ref <0.30)

## 2012-08-12 MED ORDER — MORPHINE SULFATE 4 MG/ML IJ SOLN
4.0000 mg | Freq: Once | INTRAMUSCULAR | Status: AC
  Administered 2012-08-12: 4 mg via INTRAVENOUS
  Filled 2012-08-12: qty 1

## 2012-08-12 MED ORDER — DIPHENHYDRAMINE HCL 50 MG/ML IJ SOLN
25.0000 mg | Freq: Once | INTRAMUSCULAR | Status: AC
  Administered 2012-08-12: 25 mg via INTRAVENOUS
  Filled 2012-08-12: qty 1

## 2012-08-12 MED ORDER — HEPARIN (PORCINE) IN NACL 100-0.45 UNIT/ML-% IJ SOLN
1100.0000 [IU]/h | INTRAMUSCULAR | Status: DC
  Administered 2012-08-13: 1100 [IU]/h via INTRAVENOUS
  Filled 2012-08-12 (×2): qty 250

## 2012-08-12 MED ORDER — METOPROLOL TARTRATE 1 MG/ML IV SOLN
5.0000 mg | Freq: Once | INTRAVENOUS | Status: AC
  Administered 2012-08-12: 5 mg via INTRAVENOUS
  Filled 2012-08-12: qty 5

## 2012-08-12 MED ORDER — SODIUM CHLORIDE 0.9 % IV BOLUS (SEPSIS)
500.0000 mL | Freq: Once | INTRAVENOUS | Status: AC
  Administered 2012-08-12: 500 mL via INTRAVENOUS

## 2012-08-12 MED ORDER — MORPHINE SULFATE 2 MG/ML IJ SOLN
2.0000 mg | INTRAMUSCULAR | Status: DC | PRN
  Filled 2012-08-12: qty 1

## 2012-08-12 MED ORDER — HEPARIN BOLUS VIA INFUSION
4000.0000 [IU] | Freq: Once | INTRAVENOUS | Status: AC
  Administered 2012-08-12: 4000 [IU] via INTRAVENOUS

## 2012-08-12 NOTE — ED Notes (Signed)
Called report to [address].

## 2012-08-12 NOTE — ED Provider Notes (Signed)
History     CSN: [id number]  Arrival date & time 08/12/12  1710   First MD Initiated Contact with Patient 08/12/12 1722      Chief Complaint  Patient presents with  . Chest Pain    (Consider location/radiation/quality/duration/timing/severity/associated sxs/prior treatment) Patient is a 78 y.o. male presenting with chest pain. The history is provided by the patient. No language interpreter was used.  Chest Pain The chest pain began 3 - 5 days ago. Chest pain occurs frequently. The chest pain is worsening. The pain is associated with exertion. At its most intense, the pain is at 9/10. The pain is currently at 5/10. The severity of the pain is severe. The quality of the pain is described as aching. The pain radiates to the left shoulder and left arm. Chest pain is worsened by exertion. Pertinent negatives for primary symptoms include no fever, no shortness of breath, no cough, no abdominal pain, no nausea, no vomiting and no dizziness. He tried nitroglycerin and aspirin for the symptoms.  His past medical history is significant for CAD, hyperlipidemia and hypertension.  Procedure history is positive for cardiac catheterization.     Past Medical History  Diagnosis Date  . Coronary artery disease   . Hypertension     Past Surgical History  Procedure Date  . Cardiac surgery   . Coronary stent placement   . Prostate surgery     No family history on file.  History  Substance Use Topics  . Smoking status: Former Games developer  . Smokeless tobacco: Not on file  . Alcohol Use: No      Review of Systems  Constitutional: Negative for fever and chills.  HENT: Negative for congestion and sore throat.   Respiratory: Negative for cough and shortness of breath.   Cardiovascular: Positive for chest pain. Negative for leg swelling.  Gastrointestinal: Negative for nausea, vomiting, abdominal pain, diarrhea and constipation.  Genitourinary: Negative for dysuria and frequency.  Skin:  Negative for color change and rash.  Neurological: Negative for dizziness and headaches.  Psychiatric/Behavioral: Negative for confusion and agitation.  All other systems reviewed and are negative.    Allergies  Review of patient's allergies indicates no known allergies.  Home Medications  No current outpatient prescriptions on file.  BP 133/91  Pulse 147  Temp 97.8 F (36.6 C) (Oral)  Resp 22  SpO2 96%  Physical Exam  Constitutional: He is oriented to person, place, and time. He appears well-developed and well-nourished. No distress.  HENT:  Head: Normocephalic and atraumatic.  Eyes: EOM are normal. Pupils are equal, round, and reactive to light.  Neck: Normal range of motion. Neck supple. No JVD present.  Cardiovascular: Normal rate and regular rhythm.  Exam reveals distant heart sounds.   Pulses:      Radial pulses are 2+ on the right side, and 2+ on the left side.  Pulmonary/Chest: Effort normal. No respiratory distress.  Abdominal: Soft. He exhibits no distension.  Musculoskeletal: Normal range of motion. He exhibits no edema.       Right lower leg: He exhibits no edema.       Left lower leg: He exhibits no edema.  Neurological: He is alert and oriented to person, place, and time.  Skin: Skin is warm and dry.  Psychiatric: He has a normal mood and affect. His behavior is normal.    ED Course  Procedures (including critical care time)   Labs Reviewed  CBC  COMPREHENSIVE METABOLIC PANEL   No results  found.  Date: 08/12/2012  Rate: 143  Rhythm: sinus tachycardia  QRS Axis: normal  Intervals: normal  ST/T Wave abnormalities: ST depressions anteriorly and ST depressions laterally  Conduction Disutrbances:none  Narrative Interpretation:   Old EKG Reviewed: none available  Results for orders placed during the hospital encounter of 08/12/12  CBC      Component Value Range   WBC 9.4  4.0 - 10.5 K/uL   RBC 4.37  4.22 - 5.81 MIL/uL   Hemoglobin 15.0  13.0 -  17.0 g/dL   HCT 16.1  09.6 - 04.5 %   MCV 96.3  78.0 - 100.0 fL   MCH 34.3 (*) 26.0 - 34.0 pg   MCHC 35.6  30.0 - 36.0 g/dL   RDW 40.9  81.1 - 91.4 %   Platelets 218  150 - 400 K/uL  COMPREHENSIVE METABOLIC PANEL      Component Value Range   Sodium 137  135 - 145 mEq/L   Potassium 3.7  3.5 - 5.1 mEq/L   Chloride 101  96 - 112 mEq/L   CO2 22  19 - 32 mEq/L   Glucose, Bld 201 (*) 70 - 99 mg/dL   BUN 19  6 - 23 mg/dL   Creatinine, Ser 7.82  0.50 - 1.35 mg/dL   Calcium 95.6  8.4 - 21.3 mg/dL   Total Protein 7.3  6.0 - 8.3 g/dL   Albumin 4.0  3.5 - 5.2 g/dL   AST 26  0 - 37 U/L   ALT 19  0 - 53 U/L   Alkaline Phosphatase 85  39 - 117 U/L   Total Bilirubin 0.4  0.3 - 1.2 mg/dL   GFR calc non Af Amer >90  >90 mL/min   GFR calc Af Amer >90  >90 mL/min  POCT I-STAT TROPONIN I      Component Value Range   Troponin i, poc 0.07  0.00 - 0.08 ng/mL   Comment 3              No diagnosis found.    MDM  Pt w/ PMHx of HTN, HLD, AAA and CAD s/p CABG and stents x 2 now w/ chest pain. States several day hx of left sided aching chest pain, radiating to left shoulder and LUE. Exertional. No dyspnea/nausea/diaphoresis. Pain relieved by nitro. States pain feels exactly like prior MI. States acute worsening of sx today. Took asa 325mg  and nitro x 4 w/out significant relief of sx. On arrival to ED c/o 5/10 chest pain. No hx of dvt/pe or CHF, no orthopnea or DOE. No PND.   Exam: tachycardic - pulse 147, normotensive, no resp distress or hypoxia. No pulse deficit. Lungs CTAB, heart sounds distant, no LE edema or JVD  DDx/Plan: ECG reveals ST depression in V2-V6. ST elevation aVR - concerning for post STEMI vs acute ischemia. Post ECG neg for ST elevation.  Will check CXR, troponin, cbc, bmp. Already received ASA 325mg , will give morphine for pain and refrain from nitro gtt in light of tachycardia.   Course: reassessed, no change in pain w/ morphine. Developed rash - given benadryl. Pulse continues to  by tachy and BP marginal. Given 500cc IVF and metoprolol 5mg  - HR improved to 60s and BP improved. Repeat ECG - w/ persistent ST depression. Initial troponin .07 remainder of labs unremarkable. CXR - NACPF. Called and d/w cardiology and pt admitted for ACS work up. Chest pain free at time of admit. Stable throughout ED course.  1.  Chest pain          Audelia Hives, MD 08/13/12 [phone]

## 2012-08-12 NOTE — ED Notes (Signed)
Pt is here with chest pain and pain to left shoulder blade and shoulder.  No shortness of breath or nausea.  Pt reports reflux symptoms.  Pt has had bypass and 3 stents

## 2012-08-12 NOTE — H&P (Signed)
Physician History and Physical    Noah Mahoney MRN: [id number] DOB/AGE: 1934-11-30 78 y.o. Admit date: 08/12/2012  Primary Cardiologist:  Pt sees a cardiologist in [place], but has been admitted to [facility] in past (last 2010)  CC:  Chest pain  HPI:  Pt is a 78 yo man with CAD s/p CABG 1987 with LIMA to LAD, RIMA to RCA and sequential SVG to OM1 and OM2, s/p DES x 2 to SVG in 2005 and DES x 2 to SVG again in 2007 for NSTEMIs.  Pt subsequently had another 2 DES placed to SVG and ?OM1 in 2010.  He presents today with typical exertional chest pain for him.  He reports that he has been chest pain free since his last stents in 2010.  He began having sx again about 3 days ago. He reports that his anginal sx is burning pain between his shoulder blades that radiates to his left chest.  That is the sx that has occurred prior to his CABG and all his PCIs.  He also has chronic joint pains, so when the pain first started, he thought it was his joints and he took advil and this improved the pain.  However, the pain episodes continued to recur and were happening with exertion and so he started taking nitros and this improved the pain.  He had a pain episode today after walking around at [employer] and this epsiode did not get better after taking nitro, so he came to the hospital.  At the time the pain started today, it was 10/10 and now is 1/10 after nitro and morphine.  He denies SOB, nausea, PND, orthopnea.   He has no other acute complaints.  Of note, the pt reports that he has a AAA that is about 4 cm that he is due for a follow up CT scan on this month.  He sees a Physiological scientist in [place] for this.     Pt was tachycardic on presentation, so was given IV metoprolol by ED physician.   Review of systems: A review of 10 organ systems was done and is negative except as stated above in HPI  Past Medical History  Diagnosis Date  . Coronary artery disease   . Hypertension    Past Surgical History    Procedure Date  . Cardiac surgery   . Coronary stent placement   . Prostate surgery    History   Social History  . Marital Status: Married    Spouse Name: N/A    Number of Children: N/A  . Years of Education: N/A   Occupational History  . Not on file.   Social History Main Topics  . Smoking status: Former Games developer  . Smokeless tobacco: Not on file  . Alcohol Use: No  . Drug Use: No  . Sexually Active:    Other Topics Concern  . Not on file   Social History Narrative  . No narrative on file    Family history His mother died at 26 with a history of goiter, unknown reason for death.  Father died at 82 for unknown reasons.  He has 13 siblings, one of them had known aortic aneurysm.  Allergies  Allergen Reactions  . Morphine And Related Itching     (Not in a hospital admission)  Current facility-administered medications:sodium chloride 0.9 % bolus 500 mL, 500 mL, Intravenous, Once, Audelia Hives, MD Current outpatient prescriptions:aspirin EC 325 MG tablet, Take 325 mg by mouth  daily., Disp: , Rfl: ;  clopidogrel (PLAVIX) 75 MG tablet, Take 75 mg by mouth daily., Disp: , Rfl: ;  fish oil-omega-3 fatty acids 1000 MG capsule, Take 1 g by mouth daily., Disp: , Rfl: ;  hydrochlorothiazide (MICROZIDE) 12.5 MG capsule, Take 12.5 mg by mouth daily., Disp: , Rfl: ;  lisinopril (PRINIVIL,ZESTRIL) 40 MG tablet, Take 40 mg by mouth daily., Disp: , Rfl:  metoprolol tartrate (LOPRESSOR) 25 MG tablet, Take 12.5 mg by mouth 2 (two) times daily., Disp: , Rfl: ;  Misc Natural Products (OSTEO BI-FLEX/5-LOXIN ADVANCED) TABS, Take 2 tablets by mouth daily., Disp: , Rfl: ;  nitroGLYCERIN (NITROSTAT) 0.4 MG SL tablet, Place 0.4 mg under the tongue every 5 (five) minutes as needed. For chest pain, Disp: , Rfl: ;  Potassium Gluconate 550 MG TABS, Take 1 tablet by mouth daily., Disp: , Rfl:  rosuvastatin (CRESTOR) 10 MG tablet, Take 10 mg by mouth daily., Disp: , Rfl: ;  Turmeric 500 MG CAPS, Take 1  capsule by mouth daily., Disp: , Rfl:   Physical Exam: Blood pressure 114/65, pulse 49, temperature 97.8 F (36.6 C), temperature source Oral, resp. rate 16, SpO2 97.00%.; There is no height or weight on file to calculate BMI. Temp:  [97.8 F (36.6 C)] 97.8 F (36.6 C) (01/14 1717) Pulse Rate:  [49-147] 49  (01/14 1930) Resp:  [15-25] 16  (01/14 1930) BP: (105-133)/(65-91) 114/65 mmHg (01/14 1930) SpO2:  [96 %-97 %] 97 % (01/14 1930)  No intake or output data in the 24 hours ending 08/12/12 2115 General: NAD Heent: MMM Neck: No JVD  CV: distant HS, RRR, no m  Lungs: bibasilar crackles, otherwise clear, nonlabored resp effort Abdomen: obese, Soft, nontender, nondistended Extremities: No clubbing or cyanosis.  No pedal edema Skin: Intact without lesions or rashes  Neurologic: Alert and oriented x 3, grossly nonfocal  Psych: Normal mood and affect    Labs:  Basename 08/12/12 1900  CKTOTAL --  CKMB --  TROPONINI <0.30   Lab Results  Component Value Date   WBC 9.4 08/12/2012   HGB 15.0 08/12/2012   HCT 42.1 08/12/2012   MCV 96.3 08/12/2012   PLT 218 08/12/2012    Lab 08/12/12 1749  NA 137  K 3.7  CL 101  CO2 22  BUN 19  CREATININE 0.66  CALCIUM 10.1  PROT 7.3  BILITOT 0.4  ALKPHOS 85  ALT 19  AST 26  GLUCOSE 201*   Lab Results  Component Value Date   CHOL  Value: 109        ATP III CLASSIFICATION:  <200     mg/dL   Desirable  147-829  mg/dL   Borderline High  >=562    mg/dL   High        13/0/8657   HDL 23* 04/30/2009   LDLCALC  Value: 62        Total Cholesterol/HDL:CHD Risk Coronary Heart Disease Risk Table                     Men   Women  1/2 Average Risk   3.4   3.3  Average Risk       5.0   4.4  2 X Average Risk   9.6   7.1  3 X Average Risk  23.4   11.0        Use the calculated Patient Ratio above and the CHD Risk Table to determine the patient's CHD Risk.  ATP III CLASSIFICATION (LDL):  <100     mg/dL   Optimal  960-454  mg/dL   Near or Above                     Optimal  130-159  mg/dL   Borderline  098-119  mg/dL   High  >147     mg/dL   Very High 82/03/5620   TRIG 121 04/30/2009       EKG:   08/12/12 sinus tach 140, diffuse ST dep 08/12/12 sinus brady, diffuse ST dep  No prior EKGs available for comparison   Radiology:  Dg Chest Portable 1 View  08/12/2012  *RADIOLOGY REPORT*  Clinical Data: Chest pain  PORTABLE CHEST - 1 VIEW  Comparison: 04/29/2009  Findings: Postop CABG.  Negative for heart failure.  Lungs are clear.  Negative for pneumonia or effusion.  IMPRESSION: No acute abnormality.   Original Report Authenticated By: Janeece Riggers, M.D.     ASSESSMENT: Pt is a 78 yo man with CAD s/p CABG 1987 with LIMA to LAD, RIMA to RCA and sequential SVG to OM1 and OM2, s/p DES x 2 to SVG in 2005 and DES x 2 to SVG again in 2007 for NSTEMIs.  Pt subsequently had another 2 DES placed to SVG and ?OM1 in 2010.  He presents today with typical exertional chest pain for him.  PLAN: Chest pain - typical, exertional sx.  EKG with diffuse ST dep (no prior avail for comparison).  Sx/presentation consistent with ACS - s/p aspirin 325 - continue daily aspirin, plavix, BB, ACEi, statin - heparin ACS nomogram - morphine and nitro prn pain - trend troponins - check echo - NPO p MN for cath in am - check lipids in am  AAA - we may need to get records on this prior to cath, may not be a good radial candidate due to LIMA, RIMA - will discuss with interventional in am  HTN - controlled. continue home meds  HLD - continue statin, check lipids  DM? - pt was told he had DM in past, but not currently on meds.  Check HgB A1c, accuchecks, SSI  Hypokalemia - continue pt's home potassium gluconate supplement  Ppx - heparin gtt  Dispo - admit to [facility]  Signed: Hilary Hertz, MD Cardiology Fellow 08/12/2012, 9:15 PM

## 2012-08-12 NOTE — Progress Notes (Signed)
ANTICOAGULATION CONSULT NOTE - Initial Consult  Pharmacy Consult for Heparin Indication: chest pain/ACS  Allergies  Allergen Reactions  . Morphine And Related Itching    Patient Measurements: Height: 6\' 1"  (185.4 cm) Weight: 240 lb (108.863 kg) IBW/kg (Calculated) : 79.9  Heparin Dosing Weight: 100 kg    Vital Signs: Temp: 97.8 F (36.6 C) (01/14 1717) Temp src: Oral (01/14 1717) BP: 114/51 mmHg (01/14 2200) Pulse Rate: 56  (01/14 2200)  Labs:  Basename 08/12/12 1900 08/12/12 1749  HGB -- 15.0  HCT -- 42.1  PLT -- 218  APTT -- --  LABPROT -- --  INR -- --  HEPARINUNFRC -- --  CREATININE -- 0.66  CKTOTAL -- --  CKMB -- --  TROPONINI <0.30 --    Estimated Creatinine Clearance: 103.3 ml/min (by C-G formula based on Cr of 0.66).   Medical History: Past Medical History  Diagnosis Date  . Coronary artery disease   . Hypertension     Medications:  ASA  Plavix  Lovaza  HCTZ  Zestril  Lopressor  Ntg  KCl  Crestor  Turmeric  Assessment: 78 yo male with chest pain for Heparin  Goal of Therapy:  Heparin level 0.3-0.7 units/ml Monitor platelets by anticoagulation protocol: Yes   Plan:  Heparin 4000 units IV bolus, then 1100 units/hr Check heparin level in 8 hours.   Eddie Candle 08/12/2012,11:28 PM

## 2012-08-13 ENCOUNTER — Encounter (HOSPITAL_COMMUNITY)
Admission: EM | Disposition: A | Discharge status: Home / Self Care | Payer: Self-pay | Source: Home / Self Care | Attending: Cardiovascular Disease

## 2012-08-13 DIAGNOSIS — I517 Cardiomegaly: Secondary | ICD-10-CM

## 2012-08-13 DIAGNOSIS — I214 Non-ST elevation (NSTEMI) myocardial infarction: Secondary | ICD-10-CM

## 2012-08-13 DIAGNOSIS — I25810 Atherosclerosis of coronary artery bypass graft(s) without angina pectoris: Secondary | ICD-10-CM

## 2012-08-13 HISTORY — PX: LEFT HEART CATHETERIZATION WITH CORONARY ANGIOGRAM: SHX5451

## 2012-08-13 LAB — BASIC METABOLIC PANEL
BUN: 16 mg/dL (ref 6–23)
GFR calc Af Amer: >90 mL/min (ref >90)
GFR calc non Af Amer: >90 mL/min (ref >90)
Potassium: 3.7 mEq/L (ref 3.5–5.1)
Sodium: 139 mEq/L (ref 135–145)

## 2012-08-13 LAB — PROTIME-INR: INR: 1.03 (ref 0.00–1.49)

## 2012-08-13 LAB — CBC
MCH: 33.7 pg (ref 26.0–34.0)
MCHC: 34.7 g/dL (ref 30.0–36.0)
Platelets: 189 10*3/uL (ref 150–400)

## 2012-08-13 LAB — GLUCOSE, CAPILLARY: Glucose-Capillary: 135 mg/dL — ABNORMAL HIGH (ref 70–99)

## 2012-08-13 LAB — LIPID PANEL
Cholesterol: 108 mg/dL (ref 0–200)
VLDL: 25 mg/dL (ref 0–40)

## 2012-08-13 LAB — TROPONIN I: Troponin I: 15.47 ng/mL (ref <0.30)

## 2012-08-13 LAB — HEMOGLOBIN A1C: Hgb A1c MFr Bld: 7 % — ABNORMAL HIGH (ref <5.7)

## 2012-08-13 SURGERY — LEFT HEART CATHETERIZATION WITH CORONARY ANGIOGRAM
Anesthesia: LOCAL

## 2012-08-13 MED ORDER — SODIUM CHLORIDE 0.9 % IJ SOLN: 3.0000 mL | INTRAMUSCULAR | Status: DC | PRN

## 2012-08-13 MED ORDER — FENTANYL CITRATE 0.05 MG/ML IJ SOLN
INTRAMUSCULAR | Status: AC
  Filled 2012-08-13: qty 2

## 2012-08-13 MED ORDER — OSTEO BI-FLEX/5-LOXIN ADVANCED PO TABS: 2.0000 | ORAL_TABLET | Freq: Every day | ORAL | Status: DC

## 2012-08-13 MED ORDER — POTASSIUM GLUCONATE 550 MG PO TABS: 1.0000 | ORAL_TABLET | Freq: Every day | ORAL | Status: DC

## 2012-08-13 MED ORDER — NITROGLYCERIN 0.2 MG/ML ON CALL CATH LAB
INTRAVENOUS | Status: AC
  Filled 2012-08-13: qty 1

## 2012-08-13 MED ORDER — SODIUM CHLORIDE 0.9 % IV SOLN: 250.0000 mL | INTRAVENOUS | Status: DC | PRN

## 2012-08-13 MED ORDER — SODIUM CHLORIDE 0.9 % IJ SOLN: 3.0000 mL | Freq: Two times a day (BID) | INTRAMUSCULAR | Status: DC

## 2012-08-13 MED ORDER — SODIUM CHLORIDE 0.9 % IV SOLN: INTRAVENOUS | Status: DC

## 2012-08-13 MED ORDER — HYDROCHLOROTHIAZIDE 12.5 MG PO CAPS
12.5000 mg | ORAL_CAPSULE | Freq: Every day | ORAL | Status: DC
  Administered 2012-08-13 – 2012-08-14 (×2): 12.5 mg via ORAL
  Filled 2012-08-13 (×3): qty 1

## 2012-08-13 MED ORDER — METOPROLOL TARTRATE 12.5 MG HALF TABLET
12.5000 mg | ORAL_TABLET | Freq: Two times a day (BID) | ORAL | Status: DC
  Administered 2012-08-13 – 2012-08-14 (×4): 12.5 mg via ORAL
  Filled 2012-08-13 (×7): qty 1

## 2012-08-13 MED ORDER — ACETAMINOPHEN 325 MG PO TABS: 650.0000 mg | ORAL_TABLET | ORAL | Status: DC | PRN

## 2012-08-13 MED ORDER — ASPIRIN 81 MG PO CHEW
81.0000 mg | CHEWABLE_TABLET | Freq: Every day | ORAL | Status: DC
  Administered 2012-08-14: 81 mg via ORAL
  Filled 2012-08-13: qty 1

## 2012-08-13 MED ORDER — ATORVASTATIN CALCIUM 80 MG PO TABS
80.0000 mg | ORAL_TABLET | Freq: Every day | ORAL | Status: DC
  Administered 2012-08-13: 17:00:00 80 mg via ORAL
  Filled 2012-08-13 (×3): qty 1

## 2012-08-13 MED ORDER — ONDANSETRON HCL 4 MG/2ML IJ SOLN: 4.0000 mg | Freq: Four times a day (QID) | INTRAMUSCULAR | Status: DC | PRN

## 2012-08-13 MED ORDER — MIDAZOLAM HCL 2 MG/2ML IJ SOLN
INTRAMUSCULAR | Status: AC
  Filled 2012-08-13: qty 2

## 2012-08-13 MED ORDER — HEPARIN (PORCINE) IN NACL 2-0.9 UNIT/ML-% IJ SOLN
INTRAMUSCULAR | Status: AC
  Filled 2012-08-13: qty 1000

## 2012-08-13 MED ORDER — INSULIN ASPART 100 UNIT/ML ~~LOC~~ SOLN
0.0000 [IU] | Freq: Three times a day (TID) | SUBCUTANEOUS | Status: DC
  Administered 2012-08-13: 2 [IU] via SUBCUTANEOUS
  Administered 2012-08-13: 18:00:00 3 [IU] via SUBCUTANEOUS
  Administered 2012-08-14: 2 [IU] via SUBCUTANEOUS

## 2012-08-13 MED ORDER — SODIUM CHLORIDE 0.9 % IV SOLN: INTRAVENOUS | Status: AC

## 2012-08-13 MED ORDER — ASPIRIN 81 MG PO CHEW
324.0000 mg | CHEWABLE_TABLET | ORAL | Status: AC
  Administered 2012-08-13: 324 mg via ORAL

## 2012-08-13 MED ORDER — LIDOCAINE HCL (PF) 1 % IJ SOLN
INTRAMUSCULAR | Status: AC
  Filled 2012-08-13: qty 30

## 2012-08-13 MED ORDER — OMEGA-3-ACID ETHYL ESTERS 1 G PO CAPS
1.0000 g | ORAL_CAPSULE | Freq: Every day | ORAL | Status: DC
  Administered 2012-08-13 – 2012-08-14 (×2): 1 g via ORAL
  Filled 2012-08-13 (×3): qty 1

## 2012-08-13 MED ORDER — LISINOPRIL 40 MG PO TABS
40.0000 mg | ORAL_TABLET | Freq: Every day | ORAL | Status: DC
  Administered 2012-08-13 – 2012-08-14 (×2): 40 mg via ORAL
  Filled 2012-08-13 (×3): qty 1

## 2012-08-13 MED ORDER — NITROGLYCERIN 0.4 MG SL SUBL
0.4000 mg | SUBLINGUAL_TABLET | SUBLINGUAL | Status: DC | PRN
  Administered 2012-08-13: 0.4 mg via SUBLINGUAL

## 2012-08-13 MED ORDER — NITROGLYCERIN 0.4 MG SL SUBL: 0.4000 mg | SUBLINGUAL_TABLET | SUBLINGUAL | Status: DC | PRN

## 2012-08-13 MED ORDER — CLOPIDOGREL BISULFATE 300 MG PO TABS
ORAL_TABLET | ORAL | Status: AC
  Filled 2012-08-13: qty 1

## 2012-08-13 MED ORDER — CLOPIDOGREL BISULFATE 75 MG PO TABS
75.0000 mg | ORAL_TABLET | Freq: Every day | ORAL | Status: DC
  Administered 2012-08-14: 10:00:00 75 mg via ORAL
  Filled 2012-08-13: qty 1

## 2012-08-13 MED ORDER — BIVALIRUDIN 250 MG IV SOLR
INTRAVENOUS | Status: AC
  Filled 2012-08-13: qty 250

## 2012-08-13 MED ORDER — ASPIRIN EC 325 MG PO TBEC
325.0000 mg | DELAYED_RELEASE_TABLET | Freq: Every day | ORAL | Status: DC
  Administered 2012-08-13: 325 mg via ORAL
  Filled 2012-08-13: qty 1

## 2012-08-13 NOTE — Progress Notes (Signed)
Pt received one nitro which relieved his left arm pain. Pt currently sleeping and resting well. Will cont to monitor pt.

## 2012-08-13 NOTE — Progress Notes (Signed)
Pt's trop back at 18.  He is CP free at this time, complains of some L arm pain, but says its 1/10 and "nothing to complain about."  VSS.  Discussed with Dr. Clifton James, on call interventional, will plan on cathing pt as first case in am.  If any return of pain, plan for cath tonight.  Pt with allergic reaction to morphine in ED, dc'd that from his med list.  Asked RN to given him a nitro to see if this helps with the arm pain.

## 2012-08-13 NOTE — Interval H&P Note (Signed)
History and Physical Interval Note:  08/13/2012 8:47 AM  Noah Mahoney  has presented today for cardiac cath with the diagnosis of cp/nstemi.  The various methods of treatment have been discussed with the patient and family. After consideration of risks, benefits and other options for treatment, the patient has consented to  Procedure(s) (LRB) with comments: LEFT HEART CATHETERIZATION WITH CORONARY ANGIOGRAM (N/A) as a surgical intervention .  The patient's history has been reviewed, patient examined, no change in status, stable for surgery.  I have reviewed the patient's chart and labs.  Questions were answered to the patient's satisfaction.     MCALHANY,CHRISTOPHER

## 2012-08-13 NOTE — Progress Notes (Signed)
    SUBJECTIVE: Mild chest pain this am.   BP 94/50  Pulse 62  Temp 97.6 F (36.4 C) (Other (Comment))  Resp 19  Ht 6' 1" (1.854 m)  Wt 240 lb (108.863 kg)  BMI 31.66 kg/m2  SpO2 95%  Intake/Output Summary (Last 24 hours) at 08/13/12 0726 Last data filed at 08/13/12 0600  Gross per 24 hour  Intake  65.08 ml  Output    400 ml  Net -334.92 ml    PHYSICAL EXAM General: Well developed, well nourished, in no acute distress. Alert and oriented x 3.  Psych:  Good affect, responds appropriately Neck: No JVD. No masses noted.  Lungs: Clear bilaterally with no wheezes or rhonci noted.  Heart: RRR with no murmurs noted. Abdomen: Bowel sounds are present. Soft, non-tender.  Extremities: No lower extremity edema.   LABS: Basic Metabolic Panel:  Basename 08/12/12 1749  NA 137  K 3.7  CL 101  CO2 22  GLUCOSE 201*  BUN 19  CREATININE 0.66  CALCIUM 10.1  MG --  PHOS --   CBC:  Basename 08/12/12 1749  WBC 9.4  NEUTROABS --  HGB 15.0  HCT 42.1  MCV 96.3  PLT 218   Cardiac Enzymes:  Basename 08/13/12 08/12/12 1900  CKTOTAL -- --  CKMB -- --  CKMBINDEX -- --  TROPONINI 18.68* <0.30   Current Meds:    . aspirin EC  325 mg Oral Daily  . atorvastatin  80 mg Oral q1800  . clopidogrel  75 mg Oral Daily  . hydrochlorothiazide  12.5 mg Oral Daily  . insulin aspart  0-15 Units Subcutaneous TID WC  . lisinopril  40 mg Oral Daily  . metoprolol tartrate  12.5 mg Oral BID  . omega-3 acid ethyl esters  1 g Oral Daily  . sodium chloride  3 mL Intravenous Q12H     ASSESSMENT AND PLAN:  1. CAD s/p 4V CABG with multiple previous PCI with DES in the body of the SVG to the OM1/OM2. Now admitted with chest pain, elevated troponin c/w NSTEMI. Pt pain free overnight but with mild chest pain this am. He is being managed with ASA/Plavix/beta blocker/statin/Ace-inh/heparin drip. Plans for cardiac cath with possible PCI today.  Risks and benefits reviewed with pt and family  including MI, CVA,bleeding,infection,contrast reaction,renal failure,death. He agrees to proceed.   Aric Jost  1/15/20147:26 AM  

## 2012-08-13 NOTE — H&P (View-Only) (Signed)
    SUBJECTIVE: Mild chest pain this am.   BP 94/50  Pulse 62  Temp 97.6 F (36.4 C) (Other (Comment))  Resp 19  Ht 6\' 1"  (1.854 m)  Wt 240 lb (108.863 kg)  BMI 31.66 kg/m2  SpO2 95%  Intake/Output Summary (Last 24 hours) at 08/13/12 0726 Last data filed at 08/13/12 0600  Gross per 24 hour  Intake  65.08 ml  Output    400 ml  Net -334.92 ml    PHYSICAL EXAM General: Well developed, well nourished, in no acute distress. Alert and oriented x 3.  Psych:  Good affect, responds appropriately Neck: No JVD. No masses noted.  Lungs: Clear bilaterally with no wheezes or rhonci noted.  Heart: RRR with no murmurs noted. Abdomen: Bowel sounds are present. Soft, non-tender.  Extremities: No lower extremity edema.   LABS: Basic Metabolic Panel:  Basename 08/12/12 1749  NA 137  K 3.7  CL 101  CO2 22  GLUCOSE 201*  BUN 19  CREATININE 0.66  CALCIUM 10.1  MG --  PHOS --   CBC:  Basename 08/12/12 1749  WBC 9.4  NEUTROABS --  HGB 15.0  HCT 42.1  MCV 96.3  PLT 218   Cardiac Enzymes:  Basename 08/13/12 08/12/12 1900  CKTOTAL -- --  CKMB -- --  CKMBINDEX -- --  TROPONINI 18.68* <0.30   Current Meds:    . aspirin EC  325 mg Oral Daily  . atorvastatin  80 mg Oral q1800  . clopidogrel  75 mg Oral Daily  . hydrochlorothiazide  12.5 mg Oral Daily  . insulin aspart  0-15 Units Subcutaneous TID WC  . lisinopril  40 mg Oral Daily  . metoprolol tartrate  12.5 mg Oral BID  . omega-3 acid ethyl esters  1 g Oral Daily  . sodium chloride  3 mL Intravenous Q12H     ASSESSMENT AND PLAN:  1. CAD s/p 4V CABG with multiple previous PCI with DES in the body of the SVG to the OM1/OM2. Now admitted with chest pain, elevated troponin c/w NSTEMI. Pt pain free overnight but with mild chest pain this am. He is being managed with ASA/Plavix/beta blocker/statin/Ace-inh/heparin drip. Plans for cardiac cath with possible PCI today.  Risks and benefits reviewed with pt and family  including MI, CVA,bleeding,infection,contrast reaction,renal failure,death. He agrees to proceed.   MCALHANY,CHRISTOPHER  1/15/20147:26 AM

## 2012-08-13 NOTE — ED Provider Notes (Signed)
I have supervised the resident on the management of this patient and agree with the note above. I personally interviewed and examined the patient and my addendum is below.   Noah Mahoney is a 78 y.o. male hx of HTN, HL, CAD s/p CABG and cardiac stents here with chest pain. Chest pain for several days, radiate to L shoulder. + worse with exertion. Pain relieved with nitro and felt like previous MI. He took ASA 325mg  on the day of admission. He was tachycardic with ST depressions on EKG. He was given lopressor IV and HR went to 60s but ST depression still present. Trop neg x 1. I was concerned that he had unstable angina. Cardiology was called and he was placed on heparin for unstable angina. They will cath him the next day.   CRITICAL CARE Performed by: Silverio Lay, DAVID   Total critical care time: 30 min   Critical care time was exclusive of separately billable procedures and treating other patients.  Critical care was necessary to treat or prevent imminent or life-threatening deterioration.  Critical care was time spent personally by me on the following activities: development of treatment plan with patient and/or surrogate as well as nursing, discussions with consultants, evaluation of patient's response to treatment, examination of patient, obtaining history from patient or surrogate, ordering and performing treatments and interventions, ordering and review of laboratory studies, ordering and review of radiographic studies, pulse oximetry and re-evaluation of patient's condition.    Richardean Canal, MD 08/13/12 [phone]

## 2012-08-13 NOTE — Progress Notes (Signed)
  Echocardiogram 2D Echocardiogram has been performed.  Noah Mahoney [name] 08/13/2012, 4:26 PM

## 2012-08-13 NOTE — Progress Notes (Addendum)
Md called and made aware of troponin at 18.68. Pt also had 10 beats of V tach. Pt SR heart rate in the 60's. Pt stated his pain was 1/10  (Left arm) when asked. Pt denied any pain in his chest.  MD aware made of results. New order received to give pt 1 nitro.

## 2012-08-13 NOTE — Progress Notes (Signed)
Site area: right groin  Site Prior to Removal:  Level 0  Pressure Applied For 20 MINUTES    Minutes Beginning at 1220  Manual:   yes  Patient Status During Pull:  stable  Post Pull Groin Site:  Level 0  Post Pull Instructions Given:  yes  Post Pull Pulses Present:  yes  Dressing Applied:  yes  Comments:

## 2012-08-13 NOTE — CV Procedure (Signed)
Cardiac Catheterization Operative Report  Noah Mahoney [id number] 1/15/20149:42 AM No primary provider on file.  Procedure Performed:  1. Left Heart Catheterization 2. Selective Coronary Angiography 3. SVG angiography 4. RIMA graft angiography 5. LIMA graft angiography 6. Left ventricular angiogram 7. PTCA/cutting balloon angioplasty SVG to OM1/OM2  Operator: Verne Carrow, MD  Indication:  78 yo male with history of CAD s/p previous CABG in 1997 with multiple PCI of the SVG to the OM with multiple DES in the body of the SVG, most recent 2010. Admitted with chest pain, NSTEMI.                                      Procedure Details: The risks, benefits, complications, treatment options, and expected outcomes were discussed with the patient. The patient and/or family concurred with the proposed plan, giving informed consent. The patient was brought to the cath lab after IV hydration was begun and oral premedication was given. The patient was further sedated with Versed and Fentanyl. The right groin was prepped and draped in the usual manner. Using the modified Seldinger access technique, a 5 French sheath was placed in the right femoral artery. Standard diagnostic catheters were used to perform selective coronary angiography. The JR4 catheter was used to engage all three grafts. The JL4 was used to engage the left main. A No Torque right was used to engage the RCA. A pigtail catheter was used to perform a left ventricular angiogram. He was found to have a severe stenosis in the mid body of the SVG to the OM branches within the previous placed stents. The sheath was upsized to a 6 [place] system. He was given a bolus of Angiomax and a drip was started. I engaged the SVG to the OM with a AL-1 guide. I then passed a BMW wire down the SVG. A 2.5 x 12 mm balloon was used to pre-dilate the stenosis. I then used a 3.0 x 10 mm cutting balloon x 2 in the mid body of the SVG in the area of  in-stent restenosis. There was an excellent result. The stenosis was taken from 99% down to 0%. There was excellent flow into the distal vessel. I chose not to use a distal filter since the stenosis was within the prior stented segment.   There were no immediate complications. The patient was taken to the recovery area in stable condition.   Hemodynamic Findings: Central aortic pressure: 117/58 Left ventricular pressure: 129/7/14  Angiographic Findings:  Left main: 70% ostial stenosis.   Left Anterior Descending Artery: 100% mid occlusion. Distal vessel and diagonal fills from the patent graft.   Circumflex Artery: 100% proximal occlusion. The first and second OM branches fill from the graft.   Right Coronary Artery: Dominant vessel. Diffuse 80% proximal stenosis. 100% mid occlusion. The distal vessel and PDA fills from the patent RIMA graft.   Graft Anatomy:  SVG to OM1/OM2 is patent with stents noted from the ostium through the mid body of the graft. The mid body of the graft in the stented segment has a 99% stenosis.  RIMA graft to PDA is patent LIMA graft to LAD is patent  Left Ventricular Angiogram: LVEF=50-55%  Impression: 1. Triple vessel CAD s/p 4V CABG with 4/4 patent grafts with severe in stent restenosis mid body of SVG to OM1/OM2 2. NSTEMI secondary to #1 3. Successful PTCA/cutting balloon angioplasty mid body of  SVG to OM1/OM2 4. Preserved LV systolic function  Recommendations: Continue dual anti-platelet therapy with ASA and Plavix. Continue other cardiac medications.        Complications:  None. The patient tolerated the procedure well.

## 2012-08-14 ENCOUNTER — Other Ambulatory Visit: Payer: Self-pay

## 2012-08-14 ENCOUNTER — Encounter (HOSPITAL_COMMUNITY): Payer: Self-pay | Admitting: Physician Assistant

## 2012-08-14 DIAGNOSIS — I10 Essential (primary) hypertension: Secondary | ICD-10-CM | POA: Diagnosis present

## 2012-08-14 DIAGNOSIS — E785 Hyperlipidemia, unspecified: Secondary | ICD-10-CM | POA: Diagnosis present

## 2012-08-14 DIAGNOSIS — I2510 Atherosclerotic heart disease of native coronary artery without angina pectoris: Secondary | ICD-10-CM

## 2012-08-14 DIAGNOSIS — I214 Non-ST elevation (NSTEMI) myocardial infarction: Secondary | ICD-10-CM

## 2012-08-14 DIAGNOSIS — Z951 Presence of aortocoronary bypass graft: Secondary | ICD-10-CM | POA: Diagnosis present

## 2012-08-14 LAB — BASIC METABOLIC PANEL
BUN: 11 mg/dL (ref 6–23)
CO2: 28 mEq/L (ref 19–32)
Chloride: 104 mEq/L (ref 96–112)
Creatinine, Ser: 0.73 mg/dL (ref 0.50–1.35)
Potassium: 3.6 mEq/L (ref 3.5–5.1)

## 2012-08-14 LAB — CBC
HCT: 38.5 % — ABNORMAL LOW (ref 39.0–52.0)
Hemoglobin: 13.5 g/dL (ref 13.0–17.0)
MCV: 96.7 fL (ref 78.0–100.0)
RBC: 3.98 MIL/uL — ABNORMAL LOW (ref 4.22–5.81)
WBC: 7.7 10*3/uL (ref 4.0–10.5)

## 2012-08-14 LAB — HEMOGLOBIN A1C
Hgb A1c MFr Bld: 6.8 % — ABNORMAL HIGH (ref <5.7)
Mean Plasma Glucose: 148 mg/dL — ABNORMAL HIGH (ref <117)

## 2012-08-14 LAB — GLUCOSE, CAPILLARY: Glucose-Capillary: 144 mg/dL — ABNORMAL HIGH (ref 70–99)

## 2012-08-14 MED ORDER — CLOPIDOGREL BISULFATE 75 MG PO TABS: 75.0000 mg | ORAL_TABLET | Freq: Every day | ORAL | Status: DC

## 2012-08-14 MED ORDER — NITROGLYCERIN 0.4 MG SL SUBL: 0.4000 mg | SUBLINGUAL_TABLET | SUBLINGUAL | Status: DC | PRN

## 2012-08-14 MED FILL — Dextrose Inj 5%: INTRAVENOUS | Qty: 50 | Status: AC

## 2012-08-14 NOTE — Progress Notes (Signed)
SUBJECTIVE: Feels great. No chest pain or SOB.   BP 128/66  Pulse 74  Temp 97.9 F (36.6 C) (Oral)  Resp 19  Ht 6\' 1"  (1.854 m)  Wt 240 lb (108.863 kg)  BMI 31.66 kg/m2  SpO2 93%  Intake/Output Summary (Last 24 hours) at 08/14/12 1610 Last data filed at 08/14/12 9604  Gross per 24 hour  Intake    420 ml  Output   2775 ml  Net  -2355 ml    PHYSICAL EXAM General: Well developed, well nourished, in no acute distress. Alert and oriented x 3.  Psych:  Good affect, responds appropriately Neck: No JVD. No masses noted.  Lungs: Clear bilaterally with no wheezes or rhonci noted.  Heart: RRR with no murmurs noted. Abdomen: Bowel sounds are present. Soft, non-tender.  Extremities: No lower extremity edema. Right groin cath site ok.   LABS: Basic Metabolic Panel:  Basename 08/13/12 0735 08/12/12 1749  NA 139 137  K 3.7 3.7  CL 104 101  CO2 25 22  GLUCOSE 158* 201*  BUN 16 19  CREATININE 0.68 0.66  CALCIUM 9.4 10.1  MG -- --  PHOS -- --   CBC:  Basename 08/14/12 0600 08/13/12 0735  WBC 7.7 9.0  NEUTROABS -- --  HGB 13.5 13.4  HCT 38.5* 38.6*  MCV 96.7 97.0  PLT 187 189   Cardiac Enzymes:  Basename 08/13/12 1210 08/13/12 0735 08/13/12  CKTOTAL -- -- --  CKMB -- -- --  CKMBINDEX -- -- --  TROPONINI 15.47* 19.98* 18.68*   Fasting Lipid Panel:  Basename 08/13/12 0735  CHOL 108  HDL 25*  LDLCALC 58  TRIG 540  CHOLHDL 4.3  LDLDIRECT --    Current Meds:    . aspirin  81 mg Oral Daily  . atorvastatin  80 mg Oral q1800  . clopidogrel  75 mg Oral Daily  . hydrochlorothiazide  12.5 mg Oral Daily  . insulin aspart  0-15 Units Subcutaneous TID WC  . lisinopril  40 mg Oral Daily  . metoprolol tartrate  12.5 mg Oral BID  . omega-3 acid ethyl esters  1 g Oral Daily   Echo 08/13/12: Left ventricle: The cavity size was mildly dilated. Wall thickness was normal. Systolic function was normal. The estimated ejection fraction was in the range of 60%  to 65%. - Left atrium: The atrium was mildly dilated.  Cardiac cath 08/13/12: Hemodynamic Findings:  Central aortic pressure: 117/58  Left ventricular pressure: 129/7/14  Angiographic Findings:  Left main: 70% ostial stenosis.  Left Anterior Descending Artery: 100% mid occlusion. Distal vessel and diagonal fills from the patent graft.  Circumflex Artery: 100% proximal occlusion. The first and second OM branches fill from the graft.  Right Coronary Artery: Dominant vessel. Diffuse 80% proximal stenosis. 100% mid occlusion. The distal vessel and PDA fills from the patent RIMA graft.  Graft Anatomy:  SVG to OM1/OM2 is patent with stents noted from the ostium through the mid body of the graft. The mid body of the graft in the stented segment has a 99% stenosis.  RIMA graft to PDA is patent  LIMA graft to LAD is patent  Left Ventricular Angiogram: LVEF=50-55%  Impression:  1. Triple vessel CAD s/p 4V CABG with 4/4 patent grafts with severe in stent restenosis mid body of SVG to OM1/OM2  2. NSTEMI secondary to #1  3. Successful PTCA/cutting balloon angioplasty mid body of SVG to OM1/OM2  4. Preserved LV systolic function  Recommendations:  Continue dual anti-platelet therapy with ASA and Plavix. Continue other cardiac medications.      ASSESSMENT AND PLAN:  1. CAD s/p 4V CABG: Admitted with unstable angina/NSTEMI. Pt has history of multiple previous PCI with DES in the body of the SVG to the OM1/OM2. Cath yesterday with severe in-stent restenosis mid body of SVG to OM1/OM2. This was treated with a cutting balloon angioplasty. He has done well. Continue ASA/Plavix/beta blocker/statin/Ace-inh. Continue aggressive risk factor reduction.  2. Dispo: D/C home today. Follow up with his primary cardiologist Dr. Janyce Llanos in [place] in 2-3 weeks.   MCALHANY,CHRISTOPHER  1/16/20146:49 AM

## 2012-08-14 NOTE — Discharge Summary (Signed)
See full note this am. cdm 

## 2012-08-14 NOTE — Plan of Care (Signed)
Problem: Consults Goal: Diabetes Mellitus Patient Education See Patient Education Module for education specifics. Outcome: Completed/Met Date Met:  08/14/12 Diabetes videos and handouts given

## 2012-08-14 NOTE — Discharge Summary (Signed)
Discharge Summary   Patient ID: Noah Mahoney MRN: [id number], DOB/AGE: 76-Oct-1936 76 y.o.  Primary MD: Dr. Shary Decamp in [place] Primary Cardiologist: Dr. Janyce Llanos in [place]  Admit date: 08/12/2012 D/C date:     08/14/2012      Primary Discharge Diagnoses:  1. NSTEMI/CAD  - Severe ISR mid body of SVG to OM1/OM2 s/p cutting balloon angioplasty 1/15  -  Echo EF 60-65%, mild LAE, no significant valvular dysfunction  2. Diabetes mellitus, type 2  - Newly diagnosed this admission, A1c 7.0  - F/u w/ PCP  Secondary Discharge Diagnoses:  . Coronary artery disease     s/p CABG 1987;  s/p DES x 2 to SVG in 2005; s/p DES x 2 to SVG in 2007; 2 DES placed to SVG and ?OM1 in 2010 ; s/p PTCA mid body of SVG to OM1/OM2 due to ISR 08/13/12  . Hypertension   . AAA (abdominal aortic aneurysm) without rupture   . Dyslipidemia (high LDL; low HDL)      Allergies Allergies  Allergen Reactions  . Morphine And Related Itching    Diagnostic Studies/Procedures:   08/13/12 - Cardiac Cath Hemodynamic Findings:  Central aortic pressure: 117/58  Left ventricular pressure: 129/7/14  Angiographic Findings:  Left main: 70% ostial stenosis.  Left Anterior Descending Artery: 100% mid occlusion. Distal vessel and diagonal fills from the patent graft.  Circumflex Artery: 100% proximal occlusion. The first and second OM branches fill from the graft.  Right Coronary Artery: Dominant vessel. Diffuse 80% proximal stenosis. 100% mid occlusion. The distal vessel and PDA fills from the patent RIMA graft.  Graft Anatomy:  SVG to OM1/OM2 is patent with stents noted from the ostium through the mid body of the graft. The mid body of the graft in the stented segment has a 99% stenosis.  RIMA graft to PDA is patent  LIMA graft to LAD is patent  Left Ventricular Angiogram: LVEF=50-55%  Impression:  1. Triple vessel CAD s/p 4V CABG with 4/4 patent grafts with severe in stent restenosis mid body of SVG to OM1/OM2    2. NSTEMI secondary to #1  3. Successful PTCA/cutting balloon angioplasty mid body of SVG to OM1/OM2  4. Preserved LV systolic function  Recommendations: Continue dual anti-platelet therapy with ASA and Plavix. Continue other cardiac medications  08/13/12 - Echo Study Conclusions: - Left ventricle: The cavity size was mildly dilated. Wall thickness was normal. Systolic function was normal. The estimated ejection fraction was in the range of 60% to 65%. - Left atrium: The atrium was mildly dilated.   History of Present Illness: 78 y.o. male w/ the above medical problems who [facility]12[facility]12 with complaints of chest pain.  Hospital Course: EKG revealed sinus rhythm with diffuse ST depression. CXR was without acute cardiopulmonary abnormalities. Labs were significant for normal troponin and unremarkable CBC/BMET. He was admitted for further evaluation and treatment. Repeat troponin was elevated at 18 therefore he underwent cardiac cath revealing severe in stent restenosis of the mid body SVG to OM1/OM2. He was treated with cutting balloon angioplasty. He tolerated the procedure well without complications. Recommendations were made for continued DAPT w/ ASA and Plavix and continued aggressive risk factor reduction. His A1c was 7.0. He was previously told he had prediabetes and was not on any medications. He received diabetes education and was instructed to follow up with his primary care provider. Cath site remained stable. He was able to ambulate without return of chest pain.  Echo showed EF 60-65%, mild LAE, no significant valvular dysfunction. He was seen and evaluated by Dr. Clifton James who felt he was stable for discharge home with plans for follow up as scheduled below.   Discharge Vitals: Blood pressure 135/63, pulse 85, temperature 97.7 F (36.5 C), temperature source Oral, resp. rate 23, height 6\' 1"  (1.854 m), weight 240 lb (108.863 kg), SpO2 98.00%.  Labs: Lab  Results  Component Value Date   WBC 7.7 08/14/2012   HGB 13.5 08/14/2012   HCT 38.5* 08/14/2012   MCV 96.7 08/14/2012   PLT 187 08/14/2012    Lab 08/14/12 0600 08/12/12 1749  NA 142 --  K 3.6 --  CL 104 --  CO2 28 --  BUN 11 --  CREATININE 0.73 --  CALCIUM 9.6 --  PROT -- 7.3  BILITOT -- 0.4  ALKPHOS -- 85  ALT -- 19  AST -- 26  GLUCOSE 137* --   Basename 08/13/12 1210 08/13/12 0735 08/13/12 08/12/12 1900  TROPONINI 15.47* 19.98* 18.68* <0.30   Component Value Date   CHOL 108 08/13/2012   HDL 25* 08/13/2012   LDLCALC 58 08/13/2012   TRIG 127 08/13/2012     08/13/2012 07:35  Hemoglobin A1C 7.0 (H)     Discharge Medications     Medication List     As of 08/14/2012 10:11 AM    TAKE these medications         aspirin EC 325 MG tablet   Take 325 mg by mouth daily.      clopidogrel 75 MG tablet   Commonly known as: PLAVIX   Take 1 tablet (75 mg total) by mouth daily.      fish oil-omega-3 fatty acids 1000 MG capsule   Take 1 g by mouth daily.      hydrochlorothiazide 12.5 MG capsule   Commonly known as: MICROZIDE   Take 12.5 mg by mouth daily.      lisinopril 40 MG tablet   Commonly known as: PRINIVIL,ZESTRIL   Take 40 mg by mouth daily.      metoprolol tartrate 25 MG tablet   Commonly known as: LOPRESSOR   Take 12.5 mg by mouth 2 (two) times daily.      nitroGLYCERIN 0.4 MG SL tablet   Commonly known as: NITROSTAT   Place 1 tablet (0.4 mg total) under the tongue every 5 (five) minutes as needed. For chest pain      OSTEO BI-FLEX/5-LOXIN ADVANCED Tabs   Take 2 tablets by mouth daily.      Potassium Gluconate 550 MG Tabs   Take 1 tablet by mouth daily.      rosuvastatin 10 MG tablet   Commonly known as: CRESTOR   Take 10 mg by mouth daily.      Turmeric 500 MG Caps   Take 1 capsule by mouth daily.          Disposition   Discharge Orders    Future Orders Please Complete By Expires   Diet - low sodium heart healthy      Increase activity  slowly      Discharge instructions      Comments:   * Please take all medications as prescribed and bring them with you to your office visits.  * As we discussed your blood test (A1c) showed you have diabetes mellitus and will therefore need to follow up with your primary care provider to discuss management moving forward.  * KEEP GROIN SITE CLEAN AND DRY.  Call the office for any signs of bleedings, pus, swelling, increased pain, or any other concerns. * NO HEAVY LIFTING (>10lbs) X 2 WEEKS. * NO SEXUAL ACTIVITY X 2 WEEKS. * NO DRIVING X 1 WEEK. * NO SOAKING BATHS, HOT TUBS, POOLS, ETC., X 7 DAYS.     Follow-up Information    Schedule an appointment as soon as possible for a visit with Dr. Bing Matter in [place]. (2-3 wks)       Follow up with Feliciana Rossetti, MD. Schedule an appointment as soon as possible for a visit in 1 week. (Please follow up regarding your diabetes)    Contact information:   [address] [place] [address] [phone]           Outstanding Labs/Studies:  None  Duration of Discharge Encounter: Greater than 30 minutes including physician and PA time.  Signed, Raea Magallon PA-C 08/14/2012, 10:11 AM

## 2012-08-14 NOTE — Progress Notes (Signed)
CARDIAC REHAB PHASE I   PRE:  Rate/Rhythm: 62SR  BP:  Supine: 135/63  Sitting:   Standing:    SaO2: 97%RA  MODE:  Ambulation: 500 ft   POST:  Rate/Rhythem: 106ST  BP:  Supine:   Sitting: 154/68  Standing:    SaO2: 98%RA [id number] Pt with bad hips. Had to hold to siderail to walk 500 ft with asst x 1. Sometimes uses cane at home. States back and hip difficulties. Denied CP. Gave modified ex ed due to orthopedic issues. Education completed. Declined CRP 2 due to taking care of wife who has medical issues. Discussed watching carbs as HGA1C at 7. To sitting on side of bed after walk. Set up breakfast for pt.  Noah Mahoney

## 2012-08-18 ENCOUNTER — Telehealth: Payer: Self-pay

## 2012-08-18 NOTE — Telephone Encounter (Signed)
Transition of care management call. Pt. states he is well since d/c from hospital on 08/14/12 and that he has appt scheduled with his cardiologist, Dr. Janyce Llanos, in [place] on 1/27. Also, he states he has all of his medications and is taking as directed.

## 2012-10-14 ENCOUNTER — Other Ambulatory Visit: Payer: Self-pay

## 2012-10-14 ENCOUNTER — Encounter: Payer: Self-pay | Admitting: Vascular Surgery

## 2012-10-20 ENCOUNTER — Encounter: Payer: Self-pay | Admitting: Vascular Surgery

## 2012-10-21 ENCOUNTER — Ambulatory Visit (INDEPENDENT_AMBULATORY_CARE_PROVIDER_SITE_OTHER): Payer: Medicare Other | Admitting: Vascular Surgery

## 2012-10-21 ENCOUNTER — Encounter: Payer: Self-pay | Admitting: Vascular Surgery

## 2012-10-21 VITALS — BP 163/86 | HR 65 | Resp 20 | Ht 73.0 in | Wt 239.0 lb

## 2012-10-21 DIAGNOSIS — I714 Abdominal aortic aneurysm, without rupture: Secondary | ICD-10-CM

## 2012-10-21 NOTE — Addendum Note (Signed)
Addended by: Dannielle Karvonen on: 10/21/2012 01:19 PM   Modules accepted: Orders

## 2012-10-21 NOTE — Progress Notes (Signed)
Subjective:     Patient ID: Noah Mahoney, male   DOB: 26-Jun-1935, 78 y.o.   MRN: [id number]  HPI this 78 year old male has been known to have a small abdominal aortic aneurysm which has been followed. Recently an ultrasound was performed which revealed it to be 4.7 cm in maximum diameter. He has no history of new, or back symptoms although does have chronic back pain due to arthritis. He has history of coronary artery disease with coronary artery bypass grafting performed by Dr. Andrey Mahoney in the 1980s and has had some stents placed the most recent of which was 6 weeks ago. He has been stable from a cardiac standpoint.  Past Medical History  Diagnosis Date  . Coronary artery disease     s/p CABG 1987;  s/p DES x 2 to SVG in 2005; s/p DES x 2 to SVG in 2007; 2 DES placed to SVG and ?OM1 in 2010 ; s/p PTCA mid body of SVG to OM1/OM2 due to ISR 08/13/12  . Hypertension   . AAA (abdominal aortic aneurysm) without rupture   . Dyslipidemia (high LDL; low HDL)   . Diabetes mellitus, type 2   . Hyperlipidemia   . AAA (abdominal aortic aneurysm)   . Gout   . Myositis   . Osteoarthritis   . Osteoporosis   . Paroxysmal supraventricular tachycardia     History  Substance Use Topics  . Smoking status: Former Smoker    Types: Cigarettes    Quit date: 10/21/1985  . Smokeless tobacco: Never Used  . Alcohol Use: No    Family History  Problem Relation Age of Onset  . Thyroid disease Mother   . Hypertension Father   . Aneurysm Sister     Allergies  Allergen Reactions  . Lipitor (Atorvastatin)     myalgias  . Morphine And Related Itching  . Niaspan (Niacin Er)   . Pravastatin Sodium Other (See Comments)    myalgias    Current outpatient prescriptions:aspirin EC 325 MG tablet, Take 325 mg by mouth daily., Disp: , Rfl: ;  clopidogrel (PLAVIX) 75 MG tablet, Take 1 tablet (75 mg total) by mouth daily., Disp: 31 tablet, Rfl: 11;  fish oil-omega-3 fatty acids 1000 MG capsule, Take 1 g by mouth  daily., Disp: , Rfl: ;  hydrochlorothiazide (MICROZIDE) 12.5 MG capsule, Take 12.5 mg by mouth daily., Disp: , Rfl:  lisinopril (PRINIVIL,ZESTRIL) 40 MG tablet, Take 40 mg by mouth daily., Disp: , Rfl: ;  metoprolol tartrate (LOPRESSOR) 25 MG tablet, Take 12.5 mg by mouth 2 (two) times daily., Disp: , Rfl: ;  Misc Natural Products (OSTEO BI-FLEX/5-LOXIN ADVANCED) TABS, Take 2 tablets by mouth daily., Disp: , Rfl:  nitroGLYCERIN (NITROSTAT) 0.4 MG SL tablet, Place 1 tablet (0.4 mg total) under the tongue every 5 (five) minutes as needed. For chest pain, Disp: 25 tablet, Rfl: 3;  Potassium Gluconate 550 MG TABS, Take 1 tablet by mouth daily., Disp: , Rfl: ;  rosuvastatin (CRESTOR) 10 MG tablet, Take 10 mg by mouth daily., Disp: , Rfl: ;  Turmeric 500 MG CAPS, Take 1 capsule by mouth daily., Disp: , Rfl:   BP 163/86  Pulse 65  Resp 20  Ht 6\' 1"  (1.854 m)  Wt 239 lb (108.41 kg)  BMI 31.54 kg/m2  Body mass index is 31.54 kg/(m^2).           Review of Systems denies chest pain, dyspnea on exertion, PND, orthopnea, hemoptysis, lateraliing weakness, aphasia  Objective:   Physical Exam blood pressure 163 Rady 6 heart rate 65 respirations 20 Gen.-alert and oriented x3 in no apparent distress HEENT normal for age Lungs no rhonchi or wheezing Cardiovascular regular rhythm no murmurs carotid pulses 3+ palpable no bruits audible Abdomen soft nontender no palpable masses Musculoskeletal free of  major deformities Skin clear -no rashes Neurologic normal Lower extremities 3+ femoral and dorsalis pedis pulses palpable bilaterally with no edema  Today I reviewed the ultrasound report and the other medical records provided. Agree that he does have a 4.7 cm infrarenal abdominal aortic aneurysm.      Assessment:     Infrarenal abdominal aortic aneurysm-4.7 cm History coronary artery disease with coronary artery bypass grafting in the 1980s and recent stent placement   stable     Plan:      Return in one year with CT angiogram to see if patient stent graft candidate. At present time aneurysm does not require treatment because of size. If he should develop severe abdominal or back symptoms he will report to the emergency department immediately.

## 2012-11-04 ENCOUNTER — Encounter (HOSPITAL_COMMUNITY): Payer: Self-pay | Admitting: *Deleted

## 2012-11-18 ENCOUNTER — Emergency Department (HOSPITAL_COMMUNITY): Payer: Medicare Other

## 2012-11-18 ENCOUNTER — Inpatient Hospital Stay (HOSPITAL_COMMUNITY)
Admission: EM | Admit: 2012-11-18 | Discharge: 2012-11-20 | DRG: 247 | Disposition: A | Discharge status: Home / Self Care | Payer: Medicare Other | Attending: Cardiology | Admitting: Cardiology

## 2012-11-18 ENCOUNTER — Encounter (HOSPITAL_COMMUNITY): Payer: Self-pay | Admitting: Emergency Medicine

## 2012-11-18 DIAGNOSIS — Z9861 Coronary angioplasty status: Secondary | ICD-10-CM

## 2012-11-18 DIAGNOSIS — M109 Gout, unspecified: Secondary | ICD-10-CM | POA: Diagnosis present

## 2012-11-18 DIAGNOSIS — I214 Non-ST elevation (NSTEMI) myocardial infarction: Secondary | ICD-10-CM | POA: Diagnosis present

## 2012-11-18 DIAGNOSIS — T82897A Other specified complication of cardiac prosthetic devices, implants and grafts, initial encounter: Secondary | ICD-10-CM | POA: Diagnosis present

## 2012-11-18 DIAGNOSIS — Y92009 Unspecified place in unspecified non-institutional (private) residence as the place of occurrence of the external cause: Secondary | ICD-10-CM

## 2012-11-18 DIAGNOSIS — E876 Hypokalemia: Secondary | ICD-10-CM | POA: Diagnosis not present

## 2012-11-18 DIAGNOSIS — Y840 Cardiac catheterization as the cause of abnormal reaction of the patient, or of later complication, without mention of misadventure at the time of the procedure: Secondary | ICD-10-CM | POA: Diagnosis present

## 2012-11-18 DIAGNOSIS — Z8249 Family history of ischemic heart disease and other diseases of the circulatory system: Secondary | ICD-10-CM

## 2012-11-18 DIAGNOSIS — Z7982 Long term (current) use of aspirin: Secondary | ICD-10-CM

## 2012-11-18 DIAGNOSIS — M810 Age-related osteoporosis without current pathological fracture: Secondary | ICD-10-CM | POA: Diagnosis present

## 2012-11-18 DIAGNOSIS — I714 Abdominal aortic aneurysm, without rupture, unspecified: Secondary | ICD-10-CM | POA: Diagnosis present

## 2012-11-18 DIAGNOSIS — I2582 Chronic total occlusion of coronary artery: Secondary | ICD-10-CM | POA: Diagnosis present

## 2012-11-18 DIAGNOSIS — I2510 Atherosclerotic heart disease of native coronary artery without angina pectoris: Secondary | ICD-10-CM

## 2012-11-18 DIAGNOSIS — Z79899 Other long term (current) drug therapy: Secondary | ICD-10-CM

## 2012-11-18 DIAGNOSIS — IMO0001 Reserved for inherently not codable concepts without codable children: Secondary | ICD-10-CM | POA: Diagnosis present

## 2012-11-18 DIAGNOSIS — I10 Essential (primary) hypertension: Secondary | ICD-10-CM | POA: Diagnosis present

## 2012-11-18 DIAGNOSIS — E785 Hyperlipidemia, unspecified: Secondary | ICD-10-CM | POA: Diagnosis present

## 2012-11-18 DIAGNOSIS — Z7902 Long term (current) use of antithrombotics/antiplatelets: Secondary | ICD-10-CM

## 2012-11-18 DIAGNOSIS — Z888 Allergy status to other drugs, medicaments and biological substances status: Secondary | ICD-10-CM

## 2012-11-18 DIAGNOSIS — Z951 Presence of aortocoronary bypass graft: Secondary | ICD-10-CM | POA: Diagnosis present

## 2012-11-18 DIAGNOSIS — E119 Type 2 diabetes mellitus without complications: Secondary | ICD-10-CM | POA: Diagnosis present

## 2012-11-18 LAB — CBC WITH DIFFERENTIAL/PLATELET
Eosinophils Absolute: 0.1 10*3/uL (ref 0.0–0.7)
Eosinophils Relative: 1 % (ref 0–5)
Lymphs Abs: 1.3 10*3/uL (ref 0.7–4.0)
MCH: 33.6 pg (ref 26.0–34.0)
MCV: 94.3 fL (ref 78.0–100.0)
Platelets: 195 10*3/uL (ref 150–400)
RBC: 4.22 MIL/uL (ref 4.22–5.81)

## 2012-11-18 LAB — COMPREHENSIVE METABOLIC PANEL
BUN: 16 mg/dL (ref 6–23)
Calcium: 9.8 mg/dL (ref 8.4–10.5)
Creatinine, Ser: 0.69 mg/dL (ref 0.50–1.35)
GFR calc Af Amer: >90 mL/min (ref >90)
Glucose, Bld: 159 mg/dL — ABNORMAL HIGH (ref 70–99)
Sodium: 138 mEq/L (ref 135–145)
Total Protein: 7.2 g/dL (ref 6.0–8.3)

## 2012-11-18 LAB — TROPONIN I
Troponin I: 3.77 ng/mL (ref <0.30)
Troponin I: 1.9 ng/mL (ref <0.30)

## 2012-11-18 LAB — POCT I-STAT TROPONIN I: Troponin i, poc: 2.89 ng/mL (ref 0.00–0.08)

## 2012-11-18 LAB — GLUCOSE, CAPILLARY
Glucose-Capillary: 144 mg/dL — ABNORMAL HIGH (ref 70–99)
Glucose-Capillary: 137 mg/dL — ABNORMAL HIGH (ref 70–99)

## 2012-11-18 LAB — MRSA PCR SCREENING: MRSA by PCR: NEGATIVE

## 2012-11-18 MED ORDER — HEPARIN (PORCINE) IN NACL 100-0.45 UNIT/ML-% IJ SOLN
1700.0000 [IU]/h | INTRAMUSCULAR | Status: DC
  Administered 2012-11-18: 1400 [IU]/h via INTRAVENOUS
  Administered 2012-11-19: 1700 [IU]/h via INTRAVENOUS
  Filled 2012-11-18 (×4): qty 250

## 2012-11-18 MED ORDER — OMEGA-3 FATTY ACIDS 1000 MG PO CAPS: 1.0000 g | ORAL_CAPSULE | Freq: Three times a day (TID) | ORAL | Status: DC

## 2012-11-18 MED ORDER — CLOPIDOGREL BISULFATE 75 MG PO TABS
75.0000 mg | ORAL_TABLET | Freq: Every day | ORAL | Status: DC
  Administered 2012-11-19 – 2012-11-20 (×2): 75 mg via ORAL
  Filled 2012-11-18 (×2): qty 1

## 2012-11-18 MED ORDER — NON FORMULARY: 10.0000 mg | Freq: Every day | Status: DC

## 2012-11-18 MED ORDER — HYDROCHLOROTHIAZIDE 12.5 MG PO CAPS
12.5000 mg | ORAL_CAPSULE | Freq: Every day | ORAL | Status: DC
  Administered 2012-11-19 – 2012-11-20 (×2): 12.5 mg via ORAL
  Filled 2012-11-18 (×3): qty 1

## 2012-11-18 MED ORDER — SODIUM CHLORIDE 0.9 % IV SOLN
INTRAVENOUS | Status: DC
  Administered 2012-11-19: 08:00:00 via INTRAVENOUS

## 2012-11-18 MED ORDER — LISINOPRIL 40 MG PO TABS
40.0000 mg | ORAL_TABLET | Freq: Every day | ORAL | Status: DC
  Administered 2012-11-19 – 2012-11-20 (×2): 40 mg via ORAL
  Filled 2012-11-18 (×3): qty 1

## 2012-11-18 MED ORDER — ONDANSETRON HCL 4 MG/2ML IJ SOLN: 4.0000 mg | Freq: Four times a day (QID) | INTRAMUSCULAR | Status: DC | PRN

## 2012-11-18 MED ORDER — INSULIN ASPART 100 UNIT/ML ~~LOC~~ SOLN
0.0000 [IU] | Freq: Three times a day (TID) | SUBCUTANEOUS | Status: DC
  Administered 2012-11-18 – 2012-11-20 (×3): 2 [IU] via SUBCUTANEOUS

## 2012-11-18 MED ORDER — HEPARIN BOLUS VIA INFUSION
4000.0000 [IU] | Freq: Once | INTRAVENOUS | Status: AC
  Administered 2012-11-18: 4000 [IU] via INTRAVENOUS

## 2012-11-18 MED ORDER — ALPRAZOLAM 0.25 MG PO TABS: 0.2500 mg | ORAL_TABLET | Freq: Two times a day (BID) | ORAL | Status: DC | PRN

## 2012-11-18 MED ORDER — METOPROLOL TARTRATE 12.5 MG HALF TABLET
12.5000 mg | ORAL_TABLET | Freq: Two times a day (BID) | ORAL | Status: DC
  Administered 2012-11-18 – 2012-11-20 (×4): 12.5 mg via ORAL
  Filled 2012-11-18 (×5): qty 1

## 2012-11-18 MED ORDER — NITROGLYCERIN 0.4 MG SL SUBL: 0.4000 mg | SUBLINGUAL_TABLET | SUBLINGUAL | Status: DC | PRN

## 2012-11-18 MED ORDER — ACETAMINOPHEN 325 MG PO TABS: 650.0000 mg | ORAL_TABLET | ORAL | Status: DC | PRN

## 2012-11-18 MED ORDER — COLCHICINE 0.6 MG PO TABS
0.6000 mg | ORAL_TABLET | Freq: Every day | ORAL | Status: DC
  Administered 2012-11-19 – 2012-11-20 (×2): 0.6 mg via ORAL
  Filled 2012-11-18 (×3): qty 1

## 2012-11-18 MED ORDER — ROSUVASTATIN CALCIUM 10 MG PO TABS
10.0000 mg | ORAL_TABLET | Freq: Every day | ORAL | Status: DC
  Administered 2012-11-19: 18:00:00 10 mg via ORAL
  Filled 2012-11-18 (×3): qty 1

## 2012-11-18 MED ORDER — ASPIRIN EC 81 MG PO TBEC
81.0000 mg | DELAYED_RELEASE_TABLET | Freq: Every day | ORAL | Status: DC
  Administered 2012-11-20: 09:00:00 81 mg via ORAL
  Filled 2012-11-18 (×4): qty 1

## 2012-11-18 MED ORDER — SODIUM CHLORIDE 0.9 % IV SOLN: 250.0000 mL | INTRAVENOUS | Status: DC | PRN

## 2012-11-18 MED ORDER — ZOLPIDEM TARTRATE 5 MG PO TABS: 5.0000 mg | ORAL_TABLET | Freq: Every evening | ORAL | Status: DC | PRN

## 2012-11-18 MED ORDER — SODIUM CHLORIDE 0.9 % IV SOLN
INTRAVENOUS | Status: DC
  Administered 2012-11-18: 10 mL/h via INTRAVENOUS
  Administered 2012-11-18: 20 mL/h via INTRAVENOUS

## 2012-11-18 MED ORDER — OMEGA-3-ACID ETHYL ESTERS 1 G PO CAPS
1.0000 g | ORAL_CAPSULE | Freq: Two times a day (BID) | ORAL | Status: DC
  Administered 2012-11-18 – 2012-11-20 (×4): 1 g via ORAL
  Filled 2012-11-18 (×6): qty 1

## 2012-11-18 MED ORDER — SODIUM CHLORIDE 0.9 % IJ SOLN
3.0000 mL | Freq: Two times a day (BID) | INTRAMUSCULAR | Status: DC
  Administered 2012-11-18: 3 mL via INTRAVENOUS

## 2012-11-18 MED ORDER — SODIUM CHLORIDE 0.9 % IJ SOLN: 3.0000 mL | INTRAMUSCULAR | Status: DC | PRN

## 2012-11-18 MED ORDER — HEPARIN BOLUS VIA INFUSION
2000.0000 [IU] | Freq: Once | INTRAVENOUS | Status: AC
  Administered 2012-11-18: 2000 [IU] via INTRAVENOUS
  Filled 2012-11-18: qty 2000

## 2012-11-18 NOTE — Progress Notes (Signed)
ANTICOAGULATION CONSULT NOTE - Initial Consult  Pharmacy Consult for Heparin Indication: NSTEMI  Allergies  Allergen Reactions  . Lipitor (Atorvastatin)     myalgias  . Morphine And Related Itching  . Niaspan (Niacin Er)   . Pravastatin Sodium Other (See Comments)    myalgias    Patient Measurements:   Stated: Ht: 73 in Wt: 108 kg  Vital Signs: Temp: 97.8 F (36.6 C) (04/22 0828) Temp src: Oral (04/22 0828) BP: 115/56 mmHg (04/22 1200) Pulse Rate: 58 (04/22 1200)  Labs:  Recent Labs  11/18/12 0838  HGB 14.2  HCT 39.8  PLT 195  CREATININE 0.69    The CrCl is unknown because both a height and weight (above a minimum accepted value) are required for this calculation.   Medical History: Past Medical History  Diagnosis Date  . Coronary artery disease     a. s/p CABG 1987;  b. s/p DES x 2 to SVG in 2005; c. s/p DES x 2 to SVG in 2007;  d. 2 DES placed to SVG and OM1 in 2010 ;  e. s/p PTCA mid body of SVG to OM1/OM2 due to ISR 08/13/12.  Marland Kitchen Hypertension   . AAA (abdominal aortic aneurysm) without rupture   . Dyslipidemia (high LDL; low HDL)   . Diabetes mellitus, type 2   . Hyperlipidemia   . Gout   . Myositis   . Osteoarthritis   . Osteoporosis   . Paroxysmal supraventricular tachycardia     Medications:  Electronic med rec pending  Assessment: 78 y.o. male presents after being called by another provider about elevated troponins (2.89 here) and with one week of chest pain. Noted h/o AAA, PSVT, CAD s/p CABG x4 1987 and multiple restentings. To begin heparin gtt. Plan for cath tomorrow. CBC stable at baseline.  Goal of Therapy:  Heparin level 0.3-0.7 units/ml Monitor platelets by anticoagulation protocol: Yes   Plan:  1. Heparin IV bolus 4000 units 2. Heparin gtt at 1400 units/hr 3. F/u 8 hr heparin level 4. Daily heparin level and CBC  Christoper Fabian, PharmD, BCPS Clinical pharmacist, pager [phone] 11/18/2012,1:04 PM

## 2012-11-18 NOTE — H&P (Signed)
Patient ID: CAMDIN HEGNER MRN: [id number], DOB/AGE: 1934-10-06   Admit date: 11/18/2012  Primary Physician: Noah Rossetti, MD Primary Cardiologist: Dr. Bing Matter (Noah Mahoney)  Pt. Profile:  [name] is a 78 year old with a history of AAA (4.7cm), HTN, DMII, paroxysmal supraventricular tachycardia, and CAD s/p CABG x4 with re-stenting who presents after being called by another provider about elevated heart enzyme levels and with one week of chest pain radiating into both arms and entire spine.    Problem List  Past Medical History  Diagnosis Date  . Coronary artery disease     a. s/p CABG 1987;  b. s/p DES x 2 to SVG in 2005; c. s/p DES x 2 to SVG in 2007;  d. 2 DES placed to SVG and OM1 in 2010 ;  e. s/p PTCA mid body of SVG to OM1/OM2 due to ISR 08/13/12.  Marland Kitchen Hypertension   . AAA (abdominal aortic aneurysm) without rupture   . Dyslipidemia (high LDL; low HDL)   . Diabetes mellitus, type 2   . Hyperlipidemia   . Gout   . Myositis   . Osteoarthritis   . Osteoporosis   . Paroxysmal supraventricular tachycardia     Past Surgical History  Procedure Laterality Date  . Cardiac surgery    . Coronary stent placement    . Prostate surgery       Allergies  Allergies  Allergen Reactions  . Lipitor (Atorvastatin)     myalgias  . Morphine And Related Itching  . Niaspan (Niacin Er)   . Pravastatin Sodium Other (See Comments)    myalgias    HPI:  Mr Mahoney is a 78 year old with above history who presents with one week of chest pain radiating into both arms and entire spine.  He has an extensive history of CAD.  S/p CABG 1987; s/p DES x 2 to SVG in 2005; s/p DES x 2 to SVG in 2007; 2 DES placed to SVG and ?OM1 in 2010 ; s/p PTCA mid body of SVG to OM1/OM2 due to ISR 08/13/12.  He was last seen by Dr. Hart Mahoney in March 2013 for a measurement of his AAA which was 4.7cm and stable.  His last echo in January 2013 showed an EF of 60-65%, mild aortic dilation, with no valvular dysfunction.     Following his most recent PCI, he did well w/o recurrence of Ss  Last week however, while clearing some downed tree limbs, he developed recurrent chest burning radiating down both arms.  Ever since then, every time he exerts himself, he has recurrent chest burning with radiation.  He had a particularly long episode yesterday after walking to his car from an appt with his urologist.  This persisted for about 45 mins and finally resolved sometime after taking a sl ntg.  He had a scheduled appt with his PCP 2/2 left foot gout pain, and mentioned his Ss while there.  An ecg was done and was felt to be abnl though not as pronounced as prior ecg's.  A troponin was checked in the office.  He had no c/p overnight and this morning he received a call from his doctor's office advising that he present to the ED for evaluation b/c his blood work came back abnl.  He decided to come to [facility] since we have done his procedures in the past.  He has not had chest pain today.  Home Medications  Prior to Admission medications   Medication Sig Start Date  End Date Taking? Authorizing Provider  aspirin EC 325 MG tablet Take 325 mg by mouth daily.   Yes Historical Provider, MD  clopidogrel (PLAVIX) 75 MG tablet Take 1 tablet (75 mg total) by mouth daily. 08/14/12  Yes Rhonda G Barrett, PA-C  colchicine 0.6 MG tablet Take 0.6 mg by mouth daily.   Yes Historical Provider, MD  fish oil-omega-3 fatty acids 1000 MG capsule Take 1 g by mouth 4 (four) times daily - after meals and at bedtime.    Yes Historical Provider, MD  hydrochlorothiazide (MICROZIDE) 12.5 MG capsule Take 12.5 mg by mouth daily.   Yes Historical Provider, MD  lisinopril (PRINIVIL,ZESTRIL) 40 MG tablet Take 40 mg by mouth daily.   Yes Historical Provider, MD  metoprolol tartrate (LOPRESSOR) 25 MG tablet Take 12.5 mg by mouth 2 (two) times daily.   Yes Historical Provider, MD  Misc Natural Products (OSTEO BI-FLEX/5-LOXIN ADVANCED) TABS Take 2 tablets by mouth  daily.   Yes Historical Provider, MD  nitroGLYCERIN (NITROSTAT) 0.4 MG SL tablet Place 1 tablet (0.4 mg total) under the tongue every 5 (five) minutes as needed. For chest pain 08/14/12  Yes Rhonda G Barrett, PA-C  Potassium Gluconate 550 MG TABS Take 1 tablet by mouth daily.   Yes Historical Provider, MD  rosuvastatin (CRESTOR) 10 MG tablet Take 10 mg by mouth daily.   Yes Historical Provider, MD  Turmeric 500 MG CAPS Take 1 capsule by mouth daily.   Yes Historical Provider, MD   Family History  Family History  Problem Relation Age of Onset  . Thyroid disease Mother   . Hypertension Father   . Aneurysm Sister    Social History  History   Social History  . Marital Status: Married    Spouse Name: Noah Mahoney    Number of Children: Noah Mahoney  . Years of Education: Noah Mahoney   Occupational History  . Not on file.   Social History Main Topics  . Smoking status: Not on file  . Smokeless tobacco: Never Used  . Alcohol Use: No  . Drug Use: No  . Sexually Active: Not on file   Other Topics Concern  . Not on file   Social History Narrative  . No narrative on file    Review of Systems General:  No chills, fever, night sweats or weight changes.  Cardiovascular:  Positive for chest pain, Denies dyspnea on exertion, edema, orthopnea, palpitations, paroxysmal nocturnal dyspnea. Dermatological: No rash, lesions/masses Respiratory: No cough, dyspnea Urologic: No hematuria, dysuria Abdominal:   No nausea, vomiting, diarrhea, bright red blood per rectum, melena, or hematemesis Neurologic:  No visual changes, wkns, changes in mental status. All other systems reviewed and are otherwise negative except as noted above.  Physical Exam  Blood pressure 126/65, pulse 59, temperature 97.8 F (36.6 C), temperature source Oral, resp. rate 19, SpO2 100.00%.  General: Pleasant, NAD Psych: Normal affect. Neuro: Alert and oriented X 3. Moves all extremities spontaneously. HEENT: Normal  Neck: Supple without  bruits or JVD. Lungs:  Resp regular and unlabored, diminished breath sounds. Heart: RRR no s3, s4, or murmurs. Abdomen: Soft, non-tender, non-distended, BS + x 4.  Extremities: No clubbing, cyanosis.  1+ bilat LE edema L slightly > R. DP/PT/Radials 1+ and equal bilaterally.  No femoral bruits.  Labs  Trop i, poc: 2.89  Lab Results  Component Value Date   WBC 8.0 11/18/2012   HGB 14.2 11/18/2012   HCT 39.8 11/18/2012   MCV 94.3 11/18/2012   PLT  195 11/18/2012     Recent Labs Lab 11/18/12 0838  NA 138  K 4.1  CL 102  CO2 26  BUN 16  CREATININE 0.69  CALCIUM 9.8  PROT 7.2  BILITOT 0.9  ALKPHOS 60  ALT 17  AST 45*  GLUCOSE 159*   Lab Results  Component Value Date   CHOL 108 08/13/2012   HDL 25* 08/13/2012   LDLCALC 58 08/13/2012   TRIG 127 08/13/2012   Radiology/Studies  Dg Chest 2 View  11/18/2012  *RADIOLOGY REPORT*  Clinical Data: Chest pain, burning  CHEST - 2 VIEW  Comparison: Prior chest x-ray 08/12/2012  Findings: Surgical changes of prior median sternotomy with evidence of prior multivessel CABG including both RIMA and LIMA bypasses. There is atherosclerotic calcification of the transverse aorta. The cardiac and mediastinal contours remain within normal limits. Mild pulmonary vascular congestion without overt edema, similar to prior.  No pleural effusion, pneumothorax or focal airspace consolidation.  No suspicious pulmonary nodule. Prominent right nipple shadow.  Mild degenerative changes of the bilateral acromioclavicular joints.  IMPRESSION:  1.  No acute cardiopulmonary disease. 2.  No significant interval change in the appearance of the chest compared to 08/12/2012.   Original Report Authenticated By: Malachy Moan, M.D.    ECG  Sb, 59, inflat st dep - not acutely changed.  ASSESSMENT AND PLAN  1.  NSTEMI/CAD:  Pt presents with a 1 week h/o progressive exertional angina and has now been found to have an elevated troponin.  He did have an episode of c/p  yesterday that lasted about (longer than prior episodes).  Plan to admit, cycle ce, cont asa, statin, bb, plavix, and add heparin.  Cath in AM.  He i currently pain free.  2.  HTN:  Cont home meds.  Follow.  3.  HL:  LDL 58 in January.  Cont statin.  4.  AAA: 4.7cm with mildly dilated ascending Ao on echo.  Cont bp mgmt/bb.  5.  Borderline DM:  A1c 6.9 in January.  Add SSI while in house.  Defer outpt mgmt to PCP in [place].  6.  Gout:  Cont colchicine.  Signed, Nicolasa Ducking, NP 11/18/2012, 11:51 AM Patient seen and examined. I agree with the assessment and plan as detailed above. See also my additional thoughts below.   I reviewed all the data with Mr. Brion Aliment. I have reviewed the note above and completely agree.  I examine the patient in the emergency room. He has had a non-STEMI. Fortunately he is completely stable at this time. Heparin is to be added to his regimen with plans for catheterization tomorrow. If he has return of significant symptoms catheterization will be done on a more urgent basis.  Willa Rough, MD, [facility] 11/18/2012 1:15 PM

## 2012-11-18 NOTE — ED Notes (Signed)
Moved brush last week and started to have pain all over chest sharp in nature went to dr yrsterday had blood drawn and was called at 6am and told to come to er because labs came back that he had heart issues Dora cards is his dr.

## 2012-11-18 NOTE — Care Management Note (Signed)
    Page 1 of 1   11/18/2012     5:01:46 PM   CARE MANAGEMENT NOTE 11/18/2012  Patient:  Noah Mahoney, Noah Mahoney   Account Number:  [id number]  Date Initiated:  11/18/2012  Documentation initiated by:  Junius Creamer  Subjective/Objective Assessment:   adm w mi     Action/Plan:   lives w wife, pcp dr Feliciana Rossetti   Anticipated DC Date:     Anticipated DC Plan:        DC Planning Services  CM consult      Choice offered to / List presented to:             Status of service:   Medicare Important Message given?   (If response is "NO", the following Medicare IM given date fields will be blank) Date Medicare IM given:   Date Additional Medicare IM given:    Discharge Disposition:    Per UR Regulation:  Reviewed for med. necessity/level of care/duration of stay  If discussed at Long Length of Stay Meetings, dates discussed:    Comments:  4/22 1634 debbie Noya Santarelli rn,bsn

## 2012-11-18 NOTE — ED Notes (Signed)
Pt ate meal tray provided by wife.

## 2012-11-18 NOTE — ED Notes (Signed)
Allen, MD notified of abnormal lab test results 

## 2012-11-18 NOTE — ED Provider Notes (Signed)
History     CSN: [id number]  Arrival date & time 11/18/12  8657   First MD Initiated Contact with Patient 11/18/12 [phone]      Chief Complaint  Patient presents with  . Chest Pain    (Consider location/radiation/quality/duration/timing/severity/associated sxs/prior treatment) Patient is a 78 y.o. male presenting with chest pain. The history is provided by the patient.  Chest Pain Pain location:  Substernal area Pain quality: aching   Pain radiates to:  Upper back Pain radiates to the back: no   Pain severity:  Moderate Onset quality:  Gradual Timing:  Intermittent Progression:  Waxing and waning Chronicity:  Recurrent Context: not breathing   Relieved by:  Rest Worsened by:  Exertion Ineffective treatments:  Nitroglycerin Associated symptoms: dizziness   Associated symptoms: no diaphoresis, no fever, no orthopnea and no shortness of breath   Risk factors: coronary artery disease     Past Medical History  Diagnosis Date  . Coronary artery disease     s/p CABG 1987;  s/p DES x 2 to SVG in 2005; s/p DES x 2 to SVG in 2007; 2 DES placed to SVG and ?OM1 in 2010 ; s/p PTCA mid body of SVG to OM1/OM2 due to ISR 08/13/12  . Hypertension   . AAA (abdominal aortic aneurysm) without rupture   . Dyslipidemia (high LDL; low HDL)   . Diabetes mellitus, type 2   . Hyperlipidemia   . AAA (abdominal aortic aneurysm)   . Gout   . Myositis   . Osteoarthritis   . Osteoporosis   . Paroxysmal supraventricular tachycardia     Past Surgical History  Procedure Laterality Date  . Cardiac surgery    . Coronary stent placement    . Prostate surgery      Family History  Problem Relation Age of Onset  . Thyroid disease Mother   . Hypertension Father   . Aneurysm Sister     History  Substance Use Topics  . Smoking status: Not on file  . Smokeless tobacco: Never Used  . Alcohol Use: No      Review of Systems  Constitutional: Negative for fever and diaphoresis.  Respiratory:  Negative for shortness of breath.   Cardiovascular: Positive for chest pain. Negative for orthopnea.  Neurological: Positive for dizziness.  All other systems reviewed and are negative.    Allergies  Lipitor; Morphine and related; Niaspan; and Pravastatin sodium  Home Medications   Current Outpatient Rx  Name  Route  Sig  Dispense  Refill  . aspirin EC 325 MG tablet   Oral   Take 325 mg by mouth daily.         . clopidogrel (PLAVIX) 75 MG tablet   Oral   Take 1 tablet (75 mg total) by mouth daily.   31 tablet   11   . fish oil-omega-3 fatty acids 1000 MG capsule   Oral   Take 1 g by mouth daily.         . hydrochlorothiazide (MICROZIDE) 12.5 MG capsule   Oral   Take 12.5 mg by mouth daily.         Marland Kitchen lisinopril (PRINIVIL,ZESTRIL) 40 MG tablet   Oral   Take 40 mg by mouth daily.         . metoprolol tartrate (LOPRESSOR) 25 MG tablet   Oral   Take 12.5 mg by mouth 2 (two) times daily.         . Misc Natural Products (OSTEO BI-FLEX/5-LOXIN  ADVANCED) TABS   Oral   Take 2 tablets by mouth daily.         . nitroGLYCERIN (NITROSTAT) 0.4 MG SL tablet   Sublingual   Place 1 tablet (0.4 mg total) under the tongue every 5 (five) minutes as needed. For chest pain   25 tablet   3   . Potassium Gluconate 550 MG TABS   Oral   Take 1 tablet by mouth daily.         . rosuvastatin (CRESTOR) 10 MG tablet   Oral   Take 10 mg by mouth daily.         . Turmeric 500 MG CAPS   Oral   Take 1 capsule by mouth daily.           BP 119/65  Pulse 57  Temp(Src) 97.8 F (36.6 C) (Oral)  Resp 20  SpO2 95%  Physical Exam  Nursing note and vitals reviewed. Constitutional: He is oriented to person, place, and time. He appears well-developed and well-nourished.  Non-toxic appearance. No distress.  HENT:  Head: Normocephalic and atraumatic.  Eyes: Conjunctivae, EOM and lids are normal. Pupils are equal, round, and reactive to light.  Neck: Normal range of  motion. Neck supple. No tracheal deviation present. No mass present.  Cardiovascular: Normal rate, regular rhythm and normal heart sounds.  Exam reveals no gallop.   No murmur heard. Pulmonary/Chest: Effort normal and breath sounds normal. No stridor. No respiratory distress. He has no decreased breath sounds. He has no wheezes. He has no rhonchi. He has no rales.  Abdominal: Soft. Normal appearance and bowel sounds are normal. He exhibits no distension. There is no tenderness. There is no rebound and no CVA tenderness.  Musculoskeletal: Normal range of motion. He exhibits no edema and no tenderness.  Neurological: He is alert and oriented to person, place, and time. He has normal strength. No cranial nerve deficit or sensory deficit. GCS eye subscore is 4. GCS verbal subscore is 5. GCS motor subscore is 6.  Skin: Skin is warm and dry. No abrasion and no rash noted.  Psychiatric: He has a normal mood and affect. His speech is normal and behavior is normal.    ED Course  Procedures (including critical care time)  Labs Reviewed  CBC WITH DIFFERENTIAL  COMPREHENSIVE METABOLIC PANEL   No results found.   No diagnosis found.    MDM   Date: 11/18/2012  Rate: 59  Rhythm: normal sinus rhythm  QRS Axis: normal  Intervals: normal  ST/T Wave abnormalities: nonspecific ST changes  Conduction Disutrbances:none  Narrative Interpretation:   Old EKG Reviewed: unchanged   10:05 AM pt is pain free at this time, troponin noted, cardiology to see       Toy Baker, MD 11/18/12 1005

## 2012-11-18 NOTE — ED Notes (Signed)
2nd phone call made to pharmacy about starting heparin. Still not received. They are aware.

## 2012-11-18 NOTE — Progress Notes (Signed)
ANTICOAGULATION CONSULT NOTE - Follow Up Consult  Pharmacy Consult for Heparin Indication: NSTEMI  Allergies  Allergen Reactions  . Lipitor (Atorvastatin)     myalgias  . Morphine And Related Itching  . Niaspan (Niacin Er)   . Pravastatin Sodium Other (See Comments)    myalgias    Patient Measurements: Height: 6\' 1"  (185.4 cm) Weight: 231 lb 7.7 oz (105 kg) IBW/kg (Calculated) : 79.9 Stated: Ht: 73 in Wt: 108 kg  Vital Signs: Temp: 97.5 F (36.4 C) (04/22 2000) Temp src: Oral (04/22 2000) BP: 131/70 mmHg (04/22 2200) Pulse Rate: 64 (04/22 2200)  Labs:  Recent Labs  11/18/12 0838 11/18/12 1626 11/18/12 2202  HGB 14.2  --   --   HCT 39.8  --   --   PLT 195  --   --   HEPARINUNFRC  --   --  0.17*  CREATININE 0.69  --   --   TROPONINI  --  3.77*  --     Estimated Creatinine Clearance: 99.9 ml/min (by C-G formula based on Cr of 0.69).   Medical History: Past Medical History  Diagnosis Date  . Coronary artery disease     a. s/p CABG 1987;  b. s/p DES x 2 to SVG in 2005; c. s/p DES x 2 to SVG in 2007;  d. 2 DES placed to SVG and OM1 in 2010 ;  e. s/p PTCA mid body of SVG to OM1/OM2 due to ISR 08/13/12.  Marland Kitchen Hypertension   . AAA (abdominal aortic aneurysm) without rupture   . Dyslipidemia (high LDL; low HDL)   . Diabetes mellitus, type 2   . Hyperlipidemia   . Gout   . Myositis   . Osteoarthritis   . Osteoporosis   . Paroxysmal supraventricular tachycardia     Medications:  Electronic med rec pending  Assessment: 78 y.o. male presents after being called by another provider about elevated troponins (2.89 here) and with one week of chest pain. Noted h/o AAA, PSVT, CAD s/p CABG x4 1987 and multiple restentings. To begin heparin gtt. Plan for cath tomorrow. CBC stable at baseline. Initial heparin level subtherapeutic.  Goal of Therapy:  Heparin level 0.3-0.7 units/ml Monitor platelets by anticoagulation protocol: Yes   Plan:  1. Heparin IV bolus 2000  units 2. Heparin gtt at 1700 units/hr 3. F/u 8 hr heparin level 4. Daily heparin level and CBC  Talbert Cage, PharmD Clinical pharmacist 11/18/2012,11:06 PM

## 2012-11-19 ENCOUNTER — Encounter (HOSPITAL_COMMUNITY)
Admission: EM | Disposition: A | Discharge status: Home / Self Care | Payer: Self-pay | Source: Home / Self Care | Attending: Cardiology

## 2012-11-19 DIAGNOSIS — I214 Non-ST elevation (NSTEMI) myocardial infarction: Secondary | ICD-10-CM

## 2012-11-19 DIAGNOSIS — I25810 Atherosclerosis of coronary artery bypass graft(s) without angina pectoris: Secondary | ICD-10-CM

## 2012-11-19 HISTORY — PX: LEFT HEART CATHETERIZATION WITH CORONARY/GRAFT ANGIOGRAM: SHX5450

## 2012-11-19 LAB — TROPONIN I: Troponin I: 2.02 ng/mL (ref <0.30)

## 2012-11-19 LAB — CBC
HCT: 37.8 % — ABNORMAL LOW (ref 39.0–52.0)
Hemoglobin: 13.4 g/dL (ref 13.0–17.0)
MCH: 33.4 pg (ref 26.0–34.0)
MCHC: 35.4 g/dL (ref 30.0–36.0)
MCV: 94.3 fL (ref 78.0–100.0)
Platelets: 177 K/uL (ref 150–400)
RBC: 4.01 MIL/uL — ABNORMAL LOW (ref 4.22–5.81)
RDW: 12.4 % (ref 11.5–15.5)
WBC: 6.3 K/uL (ref 4.0–10.5)

## 2012-11-19 LAB — GLUCOSE, CAPILLARY
Glucose-Capillary: 141 mg/dL — ABNORMAL HIGH (ref 70–99)
Glucose-Capillary: 102 mg/dL — ABNORMAL HIGH (ref 70–99)
Glucose-Capillary: 143 mg/dL — ABNORMAL HIGH (ref 70–99)

## 2012-11-19 LAB — PROTIME-INR: Prothrombin Time: 14.2 seconds (ref 11.6–15.2)

## 2012-11-19 SURGERY — LEFT HEART CATHETERIZATION WITH CORONARY/GRAFT ANGIOGRAM
Anesthesia: LOCAL

## 2012-11-19 MED ORDER — FENTANYL CITRATE 0.05 MG/ML IJ SOLN
INTRAMUSCULAR | Status: AC
  Filled 2012-11-19: qty 2

## 2012-11-19 MED ORDER — SODIUM CHLORIDE 0.9 % IJ SOLN
3.0000 mL | Freq: Two times a day (BID) | INTRAMUSCULAR | Status: DC
  Administered 2012-11-19: 3 mL via INTRAVENOUS

## 2012-11-19 MED ORDER — NITROGLYCERIN 1 MG/10 ML FOR IR/CATH LAB
INTRA_ARTERIAL | Status: AC
  Filled 2012-11-19: qty 10

## 2012-11-19 MED ORDER — HEPARIN (PORCINE) IN NACL 2-0.9 UNIT/ML-% IJ SOLN
INTRAMUSCULAR | Status: AC
  Filled 2012-11-19: qty 1000

## 2012-11-19 MED ORDER — STUDY - INVESTIGATIONAL DRUG SIMPLE RECORD
1.0000 mg/kg | Freq: Once | Status: DC
  Filled 2012-11-19: qty 105

## 2012-11-19 MED ORDER — LIDOCAINE HCL (PF) 1 % IJ SOLN
INTRAMUSCULAR | Status: AC
  Filled 2012-11-19: qty 30

## 2012-11-19 MED ORDER — CLOPIDOGREL BISULFATE 300 MG PO TABS
ORAL_TABLET | ORAL | Status: AC
  Filled 2012-11-19: qty 1

## 2012-11-19 MED ORDER — ASPIRIN 81 MG PO CHEW
324.0000 mg | CHEWABLE_TABLET | Freq: Once | ORAL | Status: AC
  Administered 2012-11-19: 324 mg via ORAL
  Filled 2012-11-19: qty 4

## 2012-11-19 MED ORDER — DIAZEPAM 5 MG PO TABS
5.0000 mg | ORAL_TABLET | ORAL | Status: AC
  Administered 2012-11-19: 5 mg via ORAL
  Filled 2012-11-19: qty 1

## 2012-11-19 MED ORDER — SODIUM CHLORIDE 0.9 % IV SOLN
INTRAVENOUS | Status: AC
  Administered 2012-11-19: 15:00:00 via INTRAVENOUS

## 2012-11-19 MED ORDER — SODIUM CHLORIDE 0.9 % IJ SOLN: 3.0000 mL | INTRAMUSCULAR | Status: DC | PRN

## 2012-11-19 MED ORDER — SODIUM CHLORIDE 0.9 % IV SOLN: 250.0000 mL | INTRAVENOUS | Status: DC | PRN

## 2012-11-19 MED ORDER — MIDAZOLAM HCL 2 MG/2ML IJ SOLN
INTRAMUSCULAR | Status: AC
  Filled 2012-11-19: qty 2

## 2012-11-19 MED ORDER — STUDY - INVESTIGATIONAL DRUG SIMPLE RECORD
53.0000 mg | Freq: Once | Status: DC
  Filled 2012-11-19: qty 53

## 2012-11-19 NOTE — CV Procedure (Signed)
Cardiac Catheterization Procedure Note  Name: Noah Mahoney MRN: [id number] DOB: September 23, 1934  Procedure: Left Heart Cath, Selective Coronary Angiography, LIMA/RIMA angiography, SVG angiography, LV angiography,  PTCA/Stent of SVG to OM.  Indication: Non-ST elevation myocardial infarction with known history of coronary artery disease status post CABG with multiple PCI on SVG to OM most recently in January of this year which was done with cutting balloon for severe in-stent restenosis   Medications:  Sedation:  1 mg IV Versed, 75 mcg IV Fentanyl  Contrast:  150 mL Omnipaque  Diagnostic Procedure Details: The right groin was prepped, draped, and anesthetized with 1% lidocaine. Using the modified Seldinger technique, a 5 French sheath was introduced into the right femoral artery. Standard Judkins catheters were used for selective coronary angiography and left ventriculography. The JR catheter was used to engage the SVG to OM and LIMA. He did not engage the RIMA very well and thus I used an IM catheter to engage this after using a long exchange wire. Catheter exchanges were performed over a wire.  The diagnostic procedure was well-tolerated without immediate complications.  Procedural Findings:  Hemodynamics: AO:  116/56   mmHg LV:  116/2    mmHg LVEDP: 12  mmHg  Coronary angiography: Coronary dominance: Right   Left Main:  40% ostial stenosis.  Left Anterior Descending (LAD):  Occluded in mid segment after giving a septal and diagonal branches. The diagonal branch has 80% proximal stenosis.  Circumflex (LCx):  Occluded proximally.  Right coronary artery: Occluded proximally.  SVG to OM: Patent With multiple stents noted throughout its course. There is 30% in-stent restenosis at the ostium and proximally. In the midsegment, there is 99% in-stent restenosis at the site of most recent cutting balloon angioplasty with TIMI 2 flow.  LIMA to LAD: Patent. There is minor irregularities  in the native LAD distally.  RIMA to RCA: Patent with no significant disease.   Left ventriculography: Left ventricular systolic function is  low normal , LVEF is estimated at  50 %, there is  no significant mitral regurgitation   PCI Procedure Note:  Following the diagnostic procedure, I discussed the case with Dr. Sanjuana Kava. Given that he has severe in-stent restenosis at the site of recent cutting balloon angioplasty, I elected to use a drug-eluting stent this time. The patient was enrolled in the Regulate study and was randomized to the study drug. The sheath was upsized to a 6 [place].  The study drug was given .  a 6 [place]  AL-1 guide catheter was inserted.  A  run through coronary guidewire was used to cross the lesion.  Given that the stenosis is in-stent and there are multiple overlapped stents in the graft, I felt that the risk of using distal protection device outweighs the benefit. The lesion was predilated with a  2.5 x 12 balloon.  The lesion was then stented with a  3.0 x 16 mm Promus drug-eluting stent.  The stent was postdilated with a  3.25 x 12 noncompliant balloon.  Following PCI, there was 0% residual stenosis and TIMI-3 flow. Final angiography confirmed an excellent result. Femoral hemostasis will be achieved with manual pressure.  The patient tolerated the PCI procedure well. There were no immediate procedural complications.  The patient was transferred to the post catheterization recovery area for further monitoring.  PCI Data: Vessel - SVG to OM/Segment - mid Percent Stenosis (pre)  99% in-stent restenosis TIMI-flow  2 Stent  3.0 x 16 mm Promus  drug-eluting stent Percent Stenosis (post)  0% TIMI-flow (post)  3  Final Conclusions:   1. Severe underlying 3 vessel coronary artery disease with patent LIMA to LAD and RIMA to RCA. The SVG to OM is patent. However, there is 99% in-stent restenosis at the site of most recent cutting balloon angioplasty. 2. Low normal LV systolic  function and normal left ventricular end-diastolic pressure. 3. Successful angioplasty and drug-eluting stent placement to SVG to OM.  Recommendations:  Continue lifelong dual antiplatelet therapy. Aggressive treatment of risk factors recommended.  Lorine Bears MD, [facility] 11/19/2012, 1:57 PM

## 2012-11-19 NOTE — Progress Notes (Signed)
  SUBJECTIVE: No chest pain.   BP 120/61  Pulse 57  Temp(Src) 98.1 F (36.7 C) (Oral)  Resp 18  Ht 6' 1" (1.854 m)  Wt 231 lb 7.7 oz (105 kg)  BMI 30.55 kg/m2  SpO2 94%  Intake/Output Summary (Last 24 hours) at 11/19/12 0736 Last data filed at 11/19/12 0700  Gross per 24 hour  Intake 694.12 ml  Output   1275 ml  Net -580.88 ml    PHYSICAL EXAM General: Well developed, well nourished, in no acute distress. Alert and oriented x 3.  Psych:  Good affect, responds appropriately Neck: No JVD. No masses noted.  Lungs: Clear bilaterally with no wheezes or rhonci noted.  Heart: RRR with no murmurs noted. Abdomen: Bowel sounds are present. Soft, non-tender.  Extremities: No lower extremity edema.   LABS: Basic Metabolic Panel:  Recent Labs  11/18/12 0838  NA 138  K 4.1  CL 102  CO2 26  GLUCOSE 159*  BUN 16  CREATININE 0.69  CALCIUM 9.8   CBC:  Recent Labs  11/18/12 0838 11/19/12 0410  WBC 8.0 6.3  NEUTROABS 5.4  --   HGB 14.2 13.4  HCT 39.8 37.8*  MCV 94.3 94.3  PLT 195 177   Cardiac Enzymes:  Recent Labs  11/18/12 1626 11/18/12 2202 11/19/12 0410  TROPONINI 3.77* 1.90* 2.02*   Fasting Lipid Panel: No results found for this basename: CHOL, HDL, LDLCALC, TRIG, CHOLHDL, LDLDIRECT,  in the last 72 hours  Current Meds: . aspirin EC  81 mg Oral Daily  . clopidogrel  75 mg Oral Daily  . colchicine  0.6 mg Oral Daily  . hydrochlorothiazide  12.5 mg Oral Daily  . insulin aspart  0-15 Units Subcutaneous TID WC  . lisinopril  40 mg Oral Daily  . metoprolol tartrate  12.5 mg Oral BID  . omega-3 acid ethyl esters  1 g Oral BID WC  . rosuvastatin  10 mg Oral q1800  . sodium chloride  3 mL Intravenous Q12H   Cardiac cath 08/12/12: Left main: 70% ostial stenosis.  Left Anterior Descending Artery: 100% mid occlusion. Distal vessel and diagonal fills from the patent graft.  Circumflex Artery: 100% proximal occlusion. The first and second OM branches fill  from the graft.  Right Coronary Artery: Dominant vessel. Diffuse 80% proximal stenosis. 100% mid occlusion. The distal vessel and PDA fills from the patent RIMA graft.  Graft Anatomy:  SVG to OM1/OM2 is patent with stents noted from the ostium through the mid body of the graft. The mid body of the graft in the stented segment has a 99% stenosis.  RIMA graft to PDA is patent  LIMA graft to LAD is patent  Left Ventricular Angiogram: LVEF=50-55%  Impression:  1. Triple vessel CAD s/p 4V CABG with 4/4 patent grafts with severe in stent restenosis mid body of SVG to OM1/OM2  2. NSTEMI secondary to #1  3. Successful PTCA/cutting balloon angioplasty mid body of SVG to OM1/OM2  4. Preserved LV systolic function   ASSESSMENT AND PLAN:  1. NSTEMI: history of complex CAD with CABG in 1987, multiple PCI with most recent PCI in January 2014 with cutting balloon angioplasty of the severe in stent restenosis SVG to OM1/OM2. His RIMA graft was patent to the PDA and his LIMA graft was patent to the LAD. Now admitted with chest pain, currently pain free. Elevated troponin. Plans for cath today but he has not been added to the schedule. Will add to cath   schedule now. Clear liquid breakfast. Afternoon cath based on schedule. Continue heparin drip for now. Continue ASA and Plavix.      MCALHANY,CHRISTOPHER  4/23/20147:36 AM  

## 2012-11-19 NOTE — H&P (View-Only) (Signed)
SUBJECTIVE: No chest pain.   BP 120/61  Pulse 57  Temp(Src) 98.1 F (36.7 C) (Oral)  Resp 18  Ht 6\' 1"  (1.854 m)  Wt 231 lb 7.7 oz (105 kg)  BMI 30.55 kg/m2  SpO2 94%  Intake/Output Summary (Last 24 hours) at 11/19/12 0736 Last data filed at 11/19/12 0700  Gross per 24 hour  Intake 694.12 ml  Output   1275 ml  Net -580.88 ml    PHYSICAL EXAM General: Well developed, well nourished, in no acute distress. Alert and oriented x 3.  Psych:  Good affect, responds appropriately Neck: No JVD. No masses noted.  Lungs: Clear bilaterally with no wheezes or rhonci noted.  Heart: RRR with no murmurs noted. Abdomen: Bowel sounds are present. Soft, non-tender.  Extremities: No lower extremity edema.   LABS: Basic Metabolic Panel:  Recent Labs  45/40/98 0838  NA 138  K 4.1  CL 102  CO2 26  GLUCOSE 159*  BUN 16  CREATININE 0.69  CALCIUM 9.8   CBC:  Recent Labs  11/18/12 0838 11/19/12 0410  WBC 8.0 6.3  NEUTROABS 5.4  --   HGB 14.2 13.4  HCT 39.8 37.8*  MCV 94.3 94.3  PLT 195 177   Cardiac Enzymes:  Recent Labs  11/18/12 1626 11/18/12 2202 11/19/12 0410  TROPONINI 3.77* 1.90* 2.02*   Fasting Lipid Panel: No results found for this basename: CHOL, HDL, LDLCALC, TRIG, CHOLHDL, LDLDIRECT,  in the last 72 hours  Current Meds: . aspirin EC  81 mg Oral Daily  . clopidogrel  75 mg Oral Daily  . colchicine  0.6 mg Oral Daily  . hydrochlorothiazide  12.5 mg Oral Daily  . insulin aspart  0-15 Units Subcutaneous TID WC  . lisinopril  40 mg Oral Daily  . metoprolol tartrate  12.5 mg Oral BID  . omega-3 acid ethyl esters  1 g Oral BID WC  . rosuvastatin  10 mg Oral q1800  . sodium chloride  3 mL Intravenous Q12H   Cardiac cath 08/12/12: Left main: 70% ostial stenosis.  Left Anterior Descending Artery: 100% mid occlusion. Distal vessel and diagonal fills from the patent graft.  Circumflex Artery: 100% proximal occlusion. The first and second OM branches fill  from the graft.  Right Coronary Artery: Dominant vessel. Diffuse 80% proximal stenosis. 100% mid occlusion. The distal vessel and PDA fills from the patent RIMA graft.  Graft Anatomy:  SVG to OM1/OM2 is patent with stents noted from the ostium through the mid body of the graft. The mid body of the graft in the stented segment has a 99% stenosis.  RIMA graft to PDA is patent  LIMA graft to LAD is patent  Left Ventricular Angiogram: LVEF=50-55%  Impression:  1. Triple vessel CAD s/p 4V CABG with 4/4 patent grafts with severe in stent restenosis mid body of SVG to OM1/OM2  2. NSTEMI secondary to #1  3. Successful PTCA/cutting balloon angioplasty mid body of SVG to OM1/OM2  4. Preserved LV systolic function   ASSESSMENT AND PLAN:  1. NSTEMI: history of complex CAD with CABG in 1987, multiple PCI with most recent PCI in January 2014 with cutting balloon angioplasty of the severe in stent restenosis SVG to OM1/OM2. His RIMA graft was patent to the PDA and his LIMA graft was patent to the LAD. Now admitted with chest pain, currently pain free. Elevated troponin. Plans for cath today but he has not been added to the schedule. Will add to cath  schedule now. Clear liquid breakfast. Afternoon cath based on schedule. Continue heparin drip for now. Continue ASA and Plavix.      Noah Mahoney  4/23/20147:36 AM

## 2012-11-19 NOTE — Interval H&P Note (Signed)
History and Physical Interval Note:  11/19/2012 12:15 PM  Noah Mahoney  has presented today for surgery, with the diagnosis of cp  The various methods of treatment have been discussed with the patient and family. After consideration of risks, benefits and other options for treatment, the patient has consented to  Procedure(s): LEFT HEART CATHETERIZATION WITH CORONARY/GRAFT ANGIOGRAM (N/A) as a surgical intervention .  The patient's history has been reviewed, patient examined, no change in status, stable for surgery.  I have reviewed the patient's chart and labs.  Questions were answered to the patient's satisfaction.     Lorine Bears

## 2012-11-19 NOTE — Progress Notes (Signed)
ANTICOAGULATION CONSULT NOTE - Initial Consult  Pharmacy Consult for Heparin Indication: NSTEMI  Allergies  Allergen Reactions  . Lipitor (Atorvastatin)     myalgias  . Morphine And Related Itching  . Niaspan (Niacin Er)   . Pravastatin Sodium Other (See Comments)    myalgias    Patient Measurements: Height: 6\' 1"  (185.4 cm) Weight: 231 lb 7.7 oz (105 kg) IBW/kg (Calculated) : 79.9 Stated: Ht: 73 in Wt: 108 kg  Vital Signs: Temp: 97.8 F (36.6 C) (04/23 0800) Temp src: Oral (04/23 0800) BP: 138/60 mmHg (04/23 0800) Pulse Rate: 59 (04/23 0800)  Labs:  Recent Labs  11/18/12 0838 11/18/12 1626 11/18/12 2202 11/19/12 0410 11/19/12 0700  HGB 14.2  --   --  13.4  --   HCT 39.8  --   --  37.8*  --   PLT 195  --   --  177  --   LABPROT  --   --   --   --  14.2  INR  --   --   --   --  1.11  HEPARINUNFRC  --   --  0.17*  --  0.31  CREATININE 0.69  --   --   --   --   TROPONINI  --  3.77* 1.90* 2.02*  --     Estimated Creatinine Clearance: 99.9 ml/min (by C-G formula based on Cr of 0.69).   Medical History: Past Medical History  Diagnosis Date  . Coronary artery disease     a. s/p CABG 1987;  b. s/p DES x 2 to SVG in 2005; c. s/p DES x 2 to SVG in 2007;  d. 2 DES placed to SVG and OM1 in 2010 ;  e. s/p PTCA mid body of SVG to OM1/OM2 due to ISR 08/13/12.  Marland Kitchen Hypertension   . AAA (abdominal aortic aneurysm) without rupture   . Dyslipidemia (high LDL; low HDL)   . Diabetes mellitus, type 2   . Hyperlipidemia   . Gout   . Myositis   . Osteoarthritis   . Osteoporosis   . Paroxysmal supraventricular tachycardia     Medications:  Electronic med rec pending  Assessment: 78 y.o. male presents  with one week of chest pain and positive cardiac enzymes. Noted h/o AAA, PSVT, CAD s/p CABG x4 1987 and multiple restentings as recent as January.  Plan for cath later today. CBC trended down slightly but no bleeding noted.  Heparin at goal this morning, with planned cath  will not recheck at this time.  Goal of Therapy:  Heparin level 0.3-0.7 units/ml Monitor platelets by anticoagulation protocol: Yes   Plan:  Continue heparin at 1700 units/hr Follow after cath  Sheppard Coil PharmD., BCPS Clinical Pharmacist Pager [phone] 11/19/2012 9:06 AM

## 2012-11-19 NOTE — Research (Signed)
REGULATE PCI Informed Consent   Subject Name: Noah Mahoney  Subject met inclusion and exclusion criteria.  The informed consent form, study requirements and expectations were reviewed with the subject and questions and concerns were addressed prior to the signing of the consent form.  The subject verbalized understanding of the trial requirements.  The subject agreed to participate in the REGULATE PCI trial and signed the informed consent.  The informed consent was obtained prior to performance of any protocol-specific procedures for the subject.  A copy of the signed informed consent was given to the subject and a copy was placed in the subject's medical record.  Brunilda Payor 11/19/2012, 11:00 AM

## 2012-11-20 ENCOUNTER — Encounter (HOSPITAL_COMMUNITY): Payer: Self-pay | Admitting: Nurse Practitioner

## 2012-11-20 DIAGNOSIS — I2510 Atherosclerotic heart disease of native coronary artery without angina pectoris: Secondary | ICD-10-CM

## 2012-11-20 LAB — CBC
MCH: 33.4 pg (ref 26.0–34.0)
MCHC: 35.5 g/dL (ref 30.0–36.0)
MCV: 94 fL (ref 78.0–100.0)
Platelets: 202 10*3/uL (ref 150–400)
RDW: 12.5 % (ref 11.5–15.5)
WBC: 6.9 10*3/uL (ref 4.0–10.5)

## 2012-11-20 LAB — BASIC METABOLIC PANEL
BUN: 12 mg/dL (ref 6–23)
Calcium: 9.4 mg/dL (ref 8.4–10.5)
Creatinine, Ser: 0.59 mg/dL (ref 0.50–1.35)
GFR calc Af Amer: >90 mL/min (ref >90)
GFR calc non Af Amer: >90 mL/min (ref >90)

## 2012-11-20 LAB — GLUCOSE, CAPILLARY: Glucose-Capillary: 126 mg/dL — ABNORMAL HIGH (ref 70–99)

## 2012-11-20 MED ORDER — HEART ATTACK BOUNCING BOOK
Freq: Once | Status: DC
  Filled 2012-11-20: qty 1

## 2012-11-20 MED ORDER — ASPIRIN 81 MG PO TBEC: 81.0000 mg | DELAYED_RELEASE_TABLET | Freq: Every day | ORAL | Status: DC

## 2012-11-20 MED ORDER — POTASSIUM CHLORIDE CRYS ER 20 MEQ PO TBCR
40.0000 meq | EXTENDED_RELEASE_TABLET | Freq: Once | ORAL | Status: AC
  Administered 2012-11-20: 40 meq via ORAL
  Filled 2012-11-20: qty 2

## 2012-11-20 NOTE — Progress Notes (Signed)
    SUBJECTIVE: No chest pain or SOB.   BP 142/80  Pulse 72  Temp(Src) 98.2 F (36.8 C) (Oral)  Resp 19  Ht 6\' 1"  (1.854 m)  Wt 232 lb 9.4 oz (105.5 kg)  BMI 30.69 kg/m2  SpO2 98%  Intake/Output Summary (Last 24 hours) at 11/20/12 0654 Last data filed at 11/20/12 0600  Gross per 24 hour  Intake 1761.13 ml  Output   2000 ml  Net -238.87 ml    PHYSICAL EXAM General: Well developed, well nourished, in no acute distress. Alert and oriented x 3.  Psych:  Good affect, responds appropriately Neck: No JVD. No masses noted.  Lungs: Clear bilaterally with no wheezes or rhonci noted.  Heart: RRR with no murmurs noted. Abdomen: Bowel sounds are present. Soft, non-tender.  Extremities: No lower extremity edema. Right groin cath site ok without hematoma.   LABS: Basic Metabolic Panel:  Recent Labs  98/11/91 0838 11/20/12 0244  NA 138 139  K 4.1 3.4*  CL 102 104  CO2 26 25  GLUCOSE 159* 119*  BUN 16 12  CREATININE 0.69 0.59  CALCIUM 9.8 9.4   CBC:  Recent Labs  11/18/12 0838 11/19/12 0410 11/20/12 0244  WBC 8.0 6.3 6.9  NEUTROABS 5.4  --   --   HGB 14.2 13.4 13.4  HCT 39.8 37.8* 37.7*  MCV 94.3 94.3 94.0  PLT 195 177 202   Cardiac Enzymes:  Recent Labs  11/18/12 1626 11/18/12 2202 11/19/12 0410  TROPONINI 3.77* 1.90* 2.02*   Current Meds: . aspirin EC  81 mg Oral Daily  . clopidogrel  75 mg Oral Daily  . colchicine  0.6 mg Oral Daily  . heart attack bouncing book   Does not apply Once  . hydrochlorothiazide  12.5 mg Oral Daily  . insulin aspart  0-15 Units Subcutaneous TID WC  . lisinopril  40 mg Oral Daily  . metoprolol tartrate  12.5 mg Oral BID  . omega-3 acid ethyl esters  1 g Oral BID WC  . REG-1 Study - REG-1 arm - anivamersen   (PI-Jordan)  53 mg Intravenous Once  . REG-1 Study - REG-1 arm - pegnivacogin   (PI-Jordan)  1 mg/kg Intravenous Once  . rosuvastatin  10 mg Oral q1800    ASSESSMENT AND PLAN:  1. NSTEMI: Pt admitted with chest  pain  He has history of complex CAD with CABG in 1987, multiple PCI with most recent PCI in January 2014 with cutting balloon angioplasty of the severe in stent restenosis SVG to OM1/OM2. His RIMA graft was patent to the PDA and his LIMA graft was patent to the LAD.  Elevated troponin on admission. Cath yesterday with recurrence of stent restenosis in SVG to OM. Dr. Kirke Corin performed the cath and placed a DES at the site of stent restenosis. Doing well this am.  Continue ASA and Plavix. Continue statin, beta blocker.   2. Hypokalemia: Replace potassium this am.   3. Dispo: D/C home today. Follow up with Dr. Bing Matter who is his primary cardiologist in [place].    Neelah Mannings  4/24/20146:54 AM

## 2012-11-20 NOTE — Progress Notes (Signed)
CARDIAC REHAB PHASE I   PRE:  Rate/Rhythm: 57 SB  BP:  Supine: 156/69  Sitting:   Standing:    SaO2:   MODE:  Ambulation: 600 ft   POST:  Rate/Rhythm: 102 ST  BP:  Supine:   Sitting: 164/66  Standing:    SaO2:  4098-1191  Assisted X 1 and used walker to ambulate. Pt states that he has bad knees and that he has had gout in his left foot. He has a cane and walker at home that he can use. Pt able to walk 600 feet, without c/o of cp or SOB. BP after walk 164/66. Pt to side of bed after walk with call light in reach. Completed MI  And stent education with pt. He voices understanding. Pt declines Outpt. CRP due to having to care for his wife.  Melina Copa RN 11/20/2012 8:48 AM

## 2012-11-20 NOTE — Discharge Summary (Signed)
Patient ID: Noah Mahoney,  MRN: [id number], DOB/AGE: Jan 23, 1935 78 y.o.  Admit date: 11/18/2012 Discharge date: 11/20/2012  Primary Care Provider: Feliciana Rossetti Primary Cardiologist: Dr. Bing Matter  Discharge Diagnoses Principal Problem:   NSTEMI (non-ST elevated myocardial infarction)  **s/p cath and repeat PCI/DES to the VG-> Active Problems:   Coronary atherosclerosis of native coronary artery   Hypertension   Dyslipidemia (high LDL; low HDL)   Diabetes mellitus, type 2   AAA (abdominal aortic aneurysm) without rupture  Allergies Allergies  Allergen Reactions  . Lipitor (Atorvastatin)     myalgias  . Morphine And Related Itching  . Niaspan (Niacin Er)   . Pravastatin Sodium Other (See Comments)    myalgias   Procedures  Cardiac Catheterization and Percutaneous Coronary Intervention 4.23.2014  Procedural Findings:  Hemodynamics: AO:  116/56   mmHg LV:  116/2    mmHg LVEDP: 12  mmHg  Coronary angiography: Coronary dominance: Right     Left Main:  40% ostial stenosis.  Left Anterior Descending (LAD):  Occluded in mid segment after giving a septal and diagonal branches. The diagonal branch has 80% proximal stenosis.  Circumflex (LCx):  Occluded proximally.  Right coronary artery: Occluded proximally.  SVG to OM: Patent With multiple stents noted throughout its course. There is 30% in-stent restenosis at the ostium and proximally. In the midsegment, there is 99% in-stent restenosis at the site of most recent cutting balloon angioplasty with TIMI 2 flow.    **The VG->OM was successfully treated with a 3.0 x 16 mm Promus drug-eluting stent**   LIMA to LAD: Patent. There is minor irregularities in the native LAD distally.  RIMA to RCA: Patent with no significant disease.  Left ventriculography: Left ventricular systolic function is  low normal , LVEF is estimated at  50 %, there is  no significant mitral regurgitation  _____________  History of Present  Illness  78 year old male with prior history of coronary artery disease status post coronary artery bypass grafting in 1987 with multiple interventions upon the vein graft to the obtuse marginal, the last of which was in January of this year. He was in his usual state of health until approximately one week prior to admission when he began to experience recurrent exertional substernal chest pressure and dyspnea that resolved with rest. He had a particularly bad episode on April 21 and was subsequent he seen by his primary care provider who checked a troponin while in the office. Patient was contacted early in the morning on April 22 and advised to present to the ED secondary to elevation of troponin. He did not have chest pain that day. In the ED, ECG was nonacute and repeat troponin was elevated at 3.77. He was admitted for further evaluation and management of non-ST segment elevation myocardial infarction.  Hospital Course  Patient had no further chest pain. He was maintained on his home medications including aspirin, beta blocker, statin, and ACE inhibitor therapy. He was also heparinized. He underwent diagnostic catheterization on April 23 revealing 99% in-stent restenosis within the vein graft to the obtuse marginal at the site of previous cutting balloon angioplasty. This was successfully stented using a 3.0 by 16mm Promus drug-eluting stent. Patient tolerated procedure well and post procedure has ambulated without recurrent symptoms or limitations. He'll be discharged home today in good condition with recommendation for followup with his primary cardiologist in [place] in 1 week.  Discharge Vitals Blood pressure 164/66, pulse 62, temperature 98 F (36.7 C), temperature source Oral, resp. rate  19, height 6\' 1"  (1.854 m), weight 232 lb 9.4 oz (105.5 kg), SpO2 98.00%.  Filed Weights   11/18/12 1615 11/19/12 0500 11/20/12 0535  Weight: 231 lb 7.7 oz (105 kg) 231 lb 7.7 oz (105 kg) 232 lb 9.4 oz  (105.5 kg)   Labs  CBC  Recent Labs  11/18/12 0838 11/19/12 0410 11/20/12 0244  WBC 8.0 6.3 6.9  NEUTROABS 5.4  --   --   HGB 14.2 13.4 13.4  HCT 39.8 37.8* 37.7*  MCV 94.3 94.3 94.0  PLT 195 177 202   Basic Metabolic Panel  Recent Labs  11/18/12 0838 11/20/12 0244  NA 138 139  K 4.1 3.4*  CL 102 104  CO2 26 25  GLUCOSE 159* 119*  BUN 16 12  CREATININE 0.69 0.59  CALCIUM 9.8 9.4   Liver Function Tests  Recent Labs  11/18/12 0838  AST 45*  ALT 17  ALKPHOS 60  BILITOT 0.9  PROT 7.2  ALBUMIN 3.8   Cardiac Enzymes  Recent Labs  11/18/12 1626 11/18/12 2202 11/19/12 0410  TROPONINI 3.77* 1.90* 2.02*   Disposition  Pt is being discharged home today in good condition.  Follow-up Plans & Appointments  Follow-up Information   Follow up with KRASOWSKI,Christyan, MD. (1 week)    Contact information:   [address] [place] [address] [phone]       Follow up with Feliciana Rossetti, MD. (as scheduled.)    Contact information:   [address] [place] [address] [phone]     Discharge Medications    Medication List    TAKE these medications       aspirin 81 MG EC tablet  Take 1 tablet (81 mg total) by mouth daily.     clopidogrel 75 MG tablet  Commonly known as:  PLAVIX  Take 1 tablet (75 mg total) by mouth daily.     colchicine 0.6 MG tablet  Take 0.6 mg by mouth daily.     fish oil-omega-3 fatty acids 1000 MG capsule  Take 1 g by mouth 4 (four) times daily - after meals and at bedtime.     hydrochlorothiazide 12.5 MG capsule  Commonly known as:  MICROZIDE  Take 12.5 mg by mouth daily.     lisinopril 40 MG tablet  Commonly known as:  PRINIVIL,ZESTRIL  Take 40 mg by mouth daily.     metoprolol tartrate 25 MG tablet  Commonly known as:  LOPRESSOR  Take 12.5 mg by mouth 2 (two) times daily.     nitroGLYCERIN 0.4 MG SL tablet  Commonly known as:  NITROSTAT  Place 1 tablet (0.4 mg total) under the tongue every  5 (five) minutes as needed. For chest pain     OSTEO BI-FLEX/5-LOXIN ADVANCED Tabs  Take 2 tablets by mouth daily.     Potassium Gluconate 550 MG Tabs  Take 1 tablet by mouth daily.     rosuvastatin 10 MG tablet  Commonly known as:  CRESTOR  Take 10 mg by mouth daily.     Turmeric 500 MG Caps  Take 1 capsule by mouth daily.      Outstanding Labs/Studies  None  Duration of Discharge Encounter   Greater than 30 minutes including physician time.  Signed, Nicolasa Ducking NP 11/20/2012, 9:35 AM

## 2012-11-21 NOTE — Discharge Summary (Signed)
See full note.cdm 

## 2013-10-22 ENCOUNTER — Other Ambulatory Visit: Payer: Self-pay | Admitting: Vascular Surgery

## 2013-10-22 LAB — BUN: BUN: 16 mg/dL (ref 6–23)

## 2013-10-22 LAB — CREATININE, SERUM: Creat: 0.77 mg/dL (ref 0.50–1.35)

## 2013-10-26 ENCOUNTER — Encounter: Payer: Self-pay | Admitting: Vascular Surgery

## 2013-10-27 ENCOUNTER — Ambulatory Visit (INDEPENDENT_AMBULATORY_CARE_PROVIDER_SITE_OTHER): Payer: Medicare Other | Admitting: Vascular Surgery

## 2013-10-27 ENCOUNTER — Ambulatory Visit
Admission: RE | Admit: 2013-10-27 | Discharge: 2013-10-27 | Disposition: A | Discharge status: Home / Self Care | Payer: Medicare Other | Source: Ambulatory Visit | Attending: Vascular Surgery | Admitting: Vascular Surgery

## 2013-10-27 ENCOUNTER — Encounter: Payer: Self-pay | Admitting: Vascular Surgery

## 2013-10-27 VITALS — BP 155/73 | HR 56 | Resp 18 | Ht 73.0 in | Wt 240.0 lb

## 2013-10-27 DIAGNOSIS — I714 Abdominal aortic aneurysm, without rupture, unspecified: Secondary | ICD-10-CM

## 2013-10-27 MED ORDER — IOHEXOL 350 MG/ML SOLN
75.0000 mL | Freq: Once | INTRAVENOUS | Status: AC | PRN
  Administered 2013-10-27: 75 mL via INTRAVENOUS

## 2013-10-27 NOTE — Progress Notes (Signed)
Subjective:     Patient ID: Noah Mahoney, male   DOB: 03-Feb-1935, 79 y.o.   MRN: [id number]  HPI this 79 year old male returns for continued followup regarding his abdominal aortic aneurysm. He denies any abdominal symptoms over the past 12 months. He does have chronic back discomfort with nerve compression causing radiation of pain down his left leg. He has had spinal injections in the past. He denies claudication symptoms.  Past Medical History  Diagnosis Date  . Coronary artery disease     a. s/p CABG 1987;  b. s/p DES x 2 to SVG in 2005; c. s/p DES x 2 to SVG in 2007;  d. 2 DES placed to SVG and OM1 in 2010 ;  e. s/p PTCA mid body of SVG to OM1/OM2 due to ISR 08/13/12;  f. 10/2012 NSTEMI/Cath/PCI: native 3vd, VG->OM 99isr (3.0x16 Promus DES), LIMA->LAD nl, RIMA->RCA nl, EF 50%.  . Hypertension   . AAA (abdominal aortic aneurysm) without rupture   . Dyslipidemia (high LDL; low HDL)   . Diabetes mellitus, type 2   . Hyperlipidemia   . Gout   . Myositis   . Osteoarthritis   . Osteoporosis   . Paroxysmal supraventricular tachycardia     History  Substance Use Topics  . Smoking status: Former Smoker    Types: Cigarettes    Quit date: 10/21/1985  . Smokeless tobacco: Never Used  . Alcohol Use: No    Family History  Problem Relation Age of Onset  . Thyroid disease Mother   . Hypertension Father   . Aneurysm Sister     Allergies  Allergen Reactions  . Lipitor [Atorvastatin]     myalgias  . Morphine And Related Itching  . Niaspan [Niacin Er]   . Pravastatin Sodium Other (See Comments)    myalgias    Current outpatient prescriptions:aspirin EC 81 MG EC tablet, Take 1 tablet (81 mg total) by mouth daily., Disp: 30 tablet, Rfl: ;  clopidogrel (PLAVIX) 75 MG tablet, Take 1 tablet (75 mg total) by mouth daily., Disp: 31 tablet, Rfl: 11;  colchicine 0.6 MG tablet, Take 0.6 mg by mouth daily., Disp: , Rfl: ;  fish oil-omega-3 fatty acids 1000 MG capsule, Take 1 g by mouth 4 (four)  times daily - after meals and at bedtime. , Disp: , Rfl:  hydrochlorothiazide (MICROZIDE) 12.5 MG capsule, Take 12.5 mg by mouth daily., Disp: , Rfl: ;  lisinopril (PRINIVIL,ZESTRIL) 40 MG tablet, Take 40 mg by mouth daily., Disp: , Rfl: ;  metoprolol tartrate (LOPRESSOR) 25 MG tablet, Take 12.5 mg by mouth 2 (two) times daily., Disp: , Rfl: ;  Misc Natural Products (OSTEO BI-FLEX/5-LOXIN ADVANCED) TABS, Take 2 tablets by mouth daily., Disp: , Rfl:  nitroGLYCERIN (NITROSTAT) 0.4 MG SL tablet, Place 1 tablet (0.4 mg total) under the tongue every 5 (five) minutes as needed. For chest pain, Disp: 25 tablet, Rfl: 3;  Potassium Gluconate 550 MG TABS, Take 1 tablet by mouth daily., Disp: , Rfl: ;  rosuvastatin (CRESTOR) 10 MG tablet, Take 10 mg by mouth daily., Disp: , Rfl: ;  Turmeric 500 MG CAPS, Take 1 capsule by mouth daily., Disp: , Rfl:   BP 155/73  Pulse 56  Resp 18  Ht 6\' 1"  (1.854 m)  Wt 240 lb (108.863 kg)  BMI 31.67 kg/m2  Body mass index is 31.67 kg/(m^2).           Review of Systems denies active chest pain, dyspnea on exertion, PND, orthopnea.  Does complain of pain radiating down the left leg. Denies any other symptoms other than some varicose veins and distal edema. Other systems negative complete review of systems    Objective:   Physical Exam BP 155/73  Pulse 56  Resp 18  Ht 6\' 1"  (1.854 m)  Wt 240 lb (108.863 kg)  BMI 31.67 kg/m2  Gen.-alert and oriented x3 in no apparent distress HEENT normal for age Lungs no rhonchi or wheezing Cardiovascular regular rhythm no murmurs carotid pulses 3+ palpable no bruits audible Abdomen soft nontender no palpable masses-obese  Musculoskeletal free of  major deformities Skin clear -no rashes Neurologic normal Lower extremities 3+ femoral and dorsalis pedis pulses palpable bilaterally with no edema  Today I ordered a CT angiogram of the abdomen and pelvis which I reviewed and interpreted by computer. The aneurysm itself has not  changed in significant proportions in its maximum diameter approximately 4.5 cm. It does appear that it would be a stent graft candidate if it ever became large enough for this       Assessment:     Abdominal aortic aneurysm approximately 4.5 cm maximum diameter-asymptomatic    Plan:     Return in one year for duplex scan of the aneurysm in the office and see nurse practitioner

## 2013-10-27 NOTE — Addendum Note (Signed)
Addended by: Mena Goes on: 10/27/2013 12:43 PM   Modules accepted: Orders

## 2013-12-08 ENCOUNTER — Ambulatory Visit (INDEPENDENT_AMBULATORY_CARE_PROVIDER_SITE_OTHER): Payer: Medicare Other | Admitting: Cardiovascular Disease

## 2013-12-08 ENCOUNTER — Encounter: Payer: Self-pay | Admitting: Cardiovascular Disease

## 2013-12-08 VITALS — BP 155/71 | HR 54 | Ht 72.0 in | Wt 237.8 lb

## 2013-12-08 DIAGNOSIS — I10 Essential (primary) hypertension: Secondary | ICD-10-CM

## 2013-12-08 DIAGNOSIS — I714 Abdominal aortic aneurysm, without rupture, unspecified: Secondary | ICD-10-CM

## 2013-12-08 DIAGNOSIS — I2510 Atherosclerotic heart disease of native coronary artery without angina pectoris: Secondary | ICD-10-CM

## 2013-12-08 DIAGNOSIS — E785 Hyperlipidemia, unspecified: Secondary | ICD-10-CM

## 2013-12-08 NOTE — Assessment & Plan Note (Signed)
He is overall doing well with no symptoms suggestive of angina. Continue lifelong dual antiplatelet therapy given multiple stents in SVG to OM.    

## 2013-12-08 NOTE — Assessment & Plan Note (Signed)
BP is elevated but this is unusual according to the patient. Continue to monitor. Can consider switching Metoprolol to Carvedilol in future if needed.

## 2013-12-08 NOTE — Progress Notes (Addendum)
Primary care physician: Dr. Bea Graff  HPI  79 year old male with prior history of coronary artery disease status post coronary artery bypass grafting in 1987 with multiple interventions upon the vein graft to the obtuse marginal. He is here to transfer cardiovascular care. Most recent hospitalization in April of 2014 showed severe native three-vessel coronary artery disease with patent LIMA to LAD and RIMA to RCA. SVG to OM was patent but with 99% in-stent restenosis at the site of previous Cutting Balloon angioplasty. He underwent angioplasty and Promus drug-eluting stent placement without complications. No cardiac events since then.  Anginal symptoms are usually manifested by left shoulder pain.  Other medical issues include medium size AAA followed annually by Dr. Kellie Simmering, GERD and arthritis.  He has been doing well.   Allergies  Allergen Reactions  . Lipitor [Atorvastatin]     myalgias  . Morphine And Related Itching  . Niaspan [Niacin Er]   . Pravastatin Sodium Other (See Comments)    myalgias     Current Outpatient Prescriptions on File Prior to Visit  Medication Sig Dispense Refill  . aspirin EC 81 MG EC tablet Take 1 tablet (81 mg total) by mouth daily.  30 tablet    . clopidogrel (PLAVIX) 75 MG tablet Take 1 tablet (75 mg total) by mouth daily.  31 tablet  11  . hydrochlorothiazide (MICROZIDE) 12.5 MG capsule Take 12.5 mg by mouth daily.      Marland Kitchen lisinopril (PRINIVIL,ZESTRIL) 40 MG tablet Take 40 mg by mouth daily.      . metoprolol tartrate (LOPRESSOR) 25 MG tablet Take 12.5 mg by mouth 2 (two) times daily.      . Misc Natural Products (OSTEO BI-FLEX/5-LOXIN ADVANCED) TABS Take 2 tablets by mouth daily.      . nitroGLYCERIN (NITROSTAT) 0.4 MG SL tablet Place 1 tablet (0.4 mg total) under the tongue every 5 (five) minutes as needed. For chest pain  25 tablet  3  . Potassium Gluconate 550 MG TABS Take 1 tablet by mouth daily.      . rosuvastatin (CRESTOR) 10 MG tablet Take 10  mg by mouth daily.       No current facility-administered medications on file prior to visit.     Past Medical History  Diagnosis Date  . Coronary artery disease     a. s/p CABG 1987;  b. s/p DES x 2 to SVG in 2005; c. s/p DES x 2 to SVG in 2007;  d. 2 DES placed to SVG and OM1 in 2010 ;  e. s/p PTCA mid body of SVG to OM1/OM2 due to ISR 08/13/12;  f. 10/2012 NSTEMI/Cath/PCI: native 3vd, VG->OM 99isr (3.0x16 Promus DES), LIMA->LAD nl, RIMA->RCA nl, EF 50%.  . Hypertension   . AAA (abdominal aortic aneurysm) without rupture   . Dyslipidemia (high LDL; low HDL)   . Diabetes mellitus, type 2   . Hyperlipidemia   . Gout   . Myositis   . Osteoarthritis   . Osteoporosis   . Paroxysmal supraventricular tachycardia      Past Surgical History  Procedure Laterality Date  . Cardiac surgery    . Coronary stent placement    . Prostate surgery       Family History  Problem Relation Age of Onset  . Thyroid disease Mother   . Hypertension Father   . Aneurysm Sister      History   Social History  . Marital Status: Married    Spouse Name: N/A  Number of Children: N/A  . Years of Education: N/A   Occupational History  . Not on file.   Social History Main Topics  . Smoking status: Former Smoker    Types: Cigarettes    Quit date: 10/21/1985  . Smokeless tobacco: Never Used  . Alcohol Use: No  . Drug Use: No  . Sexual Activity: Not on file   Other Topics Concern  . Not on file   Social History Narrative  . No narrative on file      PHYSICAL EXAM   BP 155/71  Pulse 54  Ht 6' (1.829 m)  Wt 237 lb 12.8 oz (107.865 kg)  BMI 32.24 kg/m2 Constitutional: He is oriented to person, place, and time. He appears well-developed and well-nourished. No distress.  HENT: No nasal discharge.  Head: Normocephalic and atraumatic.  Eyes: Pupils are equal and round.  No discharge. Neck: Normal range of motion. Neck supple. No JVD present. No thyromegaly present.  Cardiovascular:  Normal rate, regular rhythm, normal heart sounds. Exam reveals no gallop and no friction rub. No murmur heard.  Pulmonary/Chest: Effort normal and breath sounds normal. No stridor. No respiratory distress. He has no wheezes. He has no rales. He exhibits no tenderness.  Abdominal: Soft. Bowel sounds are normal. He exhibits no distension. There is no tenderness. There is no rebound and no guarding.  Musculoskeletal: Normal range of motion. He exhibits no edema and no tenderness.  Neurological: He is alert and oriented to person, place, and time. Coordination normal.  Skin: Skin is warm and dry. No rash noted. He is not diaphoretic. No erythema. No pallor.  Psychiatric: He has a normal mood and affect. His behavior is normal. Judgment and thought content normal.       EKC:MKLKJ bradycardia , anterolateral ST changes   ASSESSMENT AND PLAN

## 2013-12-08 NOTE — Assessment & Plan Note (Signed)
Continue treatment with crestor with a target LDL <70.

## 2013-12-08 NOTE — Assessment & Plan Note (Signed)
Followed by Dr. Lawson. 

## 2013-12-08 NOTE — Patient Instructions (Signed)
Your physician wants you to follow-up in: 6 MONTHS with Dr Arida.  You will receive a reminder letter in the mail two months in advance. If you don't receive a letter, please call our office to schedule the follow-up appointment.  Your physician recommends that you continue on your current medications as directed. Please refer to the Current Medication list given to you today.  

## 2014-06-01 ENCOUNTER — Ambulatory Visit (INDEPENDENT_AMBULATORY_CARE_PROVIDER_SITE_OTHER): Payer: Medicare Other | Admitting: Cardiovascular Disease

## 2014-06-01 ENCOUNTER — Encounter: Payer: Self-pay | Admitting: Cardiovascular Disease

## 2014-06-01 VITALS — BP 148/72 | HR 60 | Ht 72.0 in | Wt 240.1 lb

## 2014-06-01 DIAGNOSIS — E784 Other hyperlipidemia: Secondary | ICD-10-CM

## 2014-06-01 DIAGNOSIS — I2510 Atherosclerotic heart disease of native coronary artery without angina pectoris: Secondary | ICD-10-CM

## 2014-06-01 DIAGNOSIS — I714 Abdominal aortic aneurysm, without rupture, unspecified: Secondary | ICD-10-CM

## 2014-06-01 DIAGNOSIS — E785 Hyperlipidemia, unspecified: Secondary | ICD-10-CM

## 2014-06-01 DIAGNOSIS — I25810 Atherosclerosis of coronary artery bypass graft(s) without angina pectoris: Secondary | ICD-10-CM

## 2014-06-01 DIAGNOSIS — I10 Essential (primary) hypertension: Secondary | ICD-10-CM

## 2014-06-01 MED ORDER — CARVEDILOL 6.25 MG PO TABS: 6.2500 mg | ORAL_TABLET | Freq: Two times a day (BID) | ORAL | Status: DC

## 2014-06-01 NOTE — Assessment & Plan Note (Signed)
Followed by Dr. Lawson. 

## 2014-06-01 NOTE — Progress Notes (Signed)
Primary care physician: Dr. Bea Graff  HPI  79 year old male with prior history of coronary artery disease status post coronary artery bypass grafting in 1987 with multiple interventions upon the vein graft to the obtuse marginal.  Most recent hospitalization in April of 2014 showed severe native three-vessel coronary artery disease with patent LIMA to LAD and RIMA to RCA. SVG to OM was patent but with 99% in-stent restenosis at the site of previous [place] angioplasty. He underwent angioplasty and Promus drug-eluting stent placement without complications. No cardiac events since then.  Anginal symptoms are usually manifested by left shoulder pain.  Other medical issues include medium size AAA followed annually by Dr. Kellie Simmering, GERD and arthritis.  He has been doing well. He denies chest pain or significant dyspnea. He has chronic back pain which limits his activities. Blood pressure continues to be elevated.  Allergies  Allergen Reactions  . Lipitor [Atorvastatin]     myalgias  . Morphine And Related Itching  . Niaspan [Niacin Er]   . Pravastatin Sodium Other (See Comments)    myalgias     Current Outpatient Prescriptions on File Prior to Visit  Medication Sig Dispense Refill  . aspirin EC 81 MG EC tablet Take 1 tablet (81 mg total) by mouth daily. 30 tablet   . clopidogrel (PLAVIX) 75 MG tablet Take 1 tablet (75 mg total) by mouth daily. 31 tablet 11  . hydrochlorothiazide (MICROZIDE) 12.5 MG capsule Take 12.5 mg by mouth daily.    Marland Kitchen lisinopril (PRINIVIL,ZESTRIL) 40 MG tablet Take 40 mg by mouth daily.    . Misc Natural Products (OSTEO BI-FLEX/5-LOXIN ADVANCED) TABS Take 2 tablets by mouth daily.    . nitroGLYCERIN (NITROSTAT) 0.4 MG SL tablet Place 1 tablet (0.4 mg total) under the tongue every 5 (five) minutes as needed. For chest pain 25 tablet 3  . Potassium Gluconate 550 MG TABS Take 1 tablet by mouth daily.    . rosuvastatin (CRESTOR) 10 MG tablet Take 10 mg by mouth  daily.     No current facility-administered medications on file prior to visit.     Past Medical History  Diagnosis Date  . Coronary artery disease     a. s/p CABG 1987;  b. s/p DES x 2 to SVG in 2005; c. s/p DES x 2 to SVG in 2007;  d. 2 DES placed to SVG and OM1 in 2010 ;  e. s/p PTCA mid body of SVG to OM1/OM2 due to ISR 08/13/12;  f. 10/2012 NSTEMI/Cath/PCI: native 3vd, VG->OM 99isr (3.0x16 Promus DES), LIMA->LAD nl, RIMA->RCA nl, EF 50%.  . Hypertension   . AAA (abdominal aortic aneurysm) without rupture   . Dyslipidemia (high LDL; low HDL)   . Diabetes mellitus, type 2   . Hyperlipidemia   . Gout   . Myositis   . Osteoarthritis   . Osteoporosis   . Paroxysmal supraventricular tachycardia      Past Surgical History  Procedure Laterality Date  . Cardiac surgery    . Coronary stent placement    . Prostate surgery       Family History  Problem Relation Age of Onset  . Thyroid disease Mother   . Hypertension Father   . Aneurysm Sister      History   Social History  . Marital Status: Married    Spouse Name: N/A    Number of Children: N/A  . Years of Education: N/A   Occupational History  . Not on file.  Social History Main Topics  . Smoking status: Former Smoker    Types: Cigarettes    Quit date: 10/21/1985  . Smokeless tobacco: Never Used  . Alcohol Use: No  . Drug Use: No  . Sexual Activity: Not on file   Other Topics Concern  . Not on file   Social History Narrative      PHYSICAL EXAM   BP 148/72 mmHg  Pulse 60  Ht 6' (1.829 m)  Wt 240 lb 1.9 oz (108.918 kg)  BMI 32.56 kg/m2 Constitutional: He is oriented to person, place, and time. He appears well-developed and well-nourished. No distress.  HENT: No nasal discharge.  Head: Normocephalic and atraumatic.  Eyes: Pupils are equal and round.  No discharge. Neck: Normal range of motion. Neck supple. No JVD present. No thyromegaly present.  Cardiovascular: Normal rate, regular rhythm, normal  heart sounds. Exam reveals no gallop and no friction rub. No murmur heard.  Pulmonary/Chest: Effort normal and breath sounds normal. No stridor. No respiratory distress. He has no wheezes. He has no rales. He exhibits no tenderness.  Abdominal: Soft. Bowel sounds are normal. He exhibits no distension. There is no tenderness. There is no rebound and no guarding.  Musculoskeletal: Normal range of motion. He exhibits no edema and no tenderness.  Neurological: He is alert and oriented to person, place, and time. Coordination normal.  Skin: Skin is warm and dry. No rash noted. He is not diaphoretic. No erythema. No pallor.  Psychiatric: He has a normal mood and affect. His behavior is normal. Judgment and thought content normal.        ASSESSMENT AND PLAN

## 2014-06-01 NOTE — Patient Instructions (Addendum)
Your physician has recommended you make the following change in your medication:  1. STOP Metoprolol Tartrate  2. START Carvedilol 6.25mg  take one by mouth twice a day  Your physician wants you to follow-up in: 6 MONTHS with Dr Fletcher Anon.  You will receive a reminder letter in the mail two months in advance. If you don't receive a letter, please call our office to schedule the follow-up appointment.

## 2014-06-01 NOTE — Assessment & Plan Note (Addendum)
Lab Results  Component Value Date   CHOL 108 08/13/2012   HDL 25* 08/13/2012   LDLCALC 58 08/13/2012   TRIG 127 08/13/2012   CHOLHDL 4.3 08/13/2012   Continue treatment with rosuvastatin.

## 2014-06-01 NOTE — Assessment & Plan Note (Signed)
Blood pressure continues to be elevated. Thus, I switched metoprolol to carvedilol for better control.

## 2014-06-01 NOTE — Assessment & Plan Note (Signed)
He is overall doing well with no symptoms suggestive of angina. Continue lifelong dual antiplatelet therapy given multiple stents in SVG to OM.

## 2014-06-08 ENCOUNTER — Ambulatory Visit: Payer: Medicare Other | Admitting: Cardiovascular Disease

## 2014-07-08 ENCOUNTER — Encounter (HOSPITAL_COMMUNITY): Payer: Self-pay | Admitting: Cardiovascular Disease

## 2014-09-28 HISTORY — PX: EYE SURGERY: SHX253

## 2014-10-25 ENCOUNTER — Encounter: Payer: Self-pay | Admitting: Family

## 2014-10-26 ENCOUNTER — Ambulatory Visit (HOSPITAL_COMMUNITY)
Admission: RE | Admit: 2014-10-26 | Discharge: 2014-10-26 | Disposition: A | Discharge status: Home / Self Care | Payer: Medicare Other | Source: Ambulatory Visit | Attending: Family | Admitting: Family

## 2014-10-26 ENCOUNTER — Ambulatory Visit (INDEPENDENT_AMBULATORY_CARE_PROVIDER_SITE_OTHER): Payer: Medicare Other | Admitting: Family

## 2014-10-26 ENCOUNTER — Encounter (INDEPENDENT_AMBULATORY_CARE_PROVIDER_SITE_OTHER): Payer: Self-pay

## 2014-10-26 ENCOUNTER — Encounter: Payer: Self-pay | Admitting: Family

## 2014-10-26 VITALS — BP 127/67 | HR 48 | Resp 14 | Ht 72.0 in | Wt 240.0 lb

## 2014-10-26 DIAGNOSIS — I714 Abdominal aortic aneurysm, without rupture, unspecified: Secondary | ICD-10-CM

## 2014-10-26 DIAGNOSIS — Z87891 Personal history of nicotine dependence: Secondary | ICD-10-CM | POA: Diagnosis not present

## 2014-10-26 NOTE — Patient Instructions (Signed)
Abdominal Aortic Aneurysm An aneurysm is a weakened or damaged part of an artery wall that bulges from the normal force of blood pumping through the body. An abdominal aortic aneurysm is an aneurysm that occurs in the lower part of the aorta, the main artery of the body.  The major concern with an abdominal aortic aneurysm is that it can enlarge and burst (rupture) or blood can flow between the layers of the wall of the aorta through a tear (aorticdissection). Both of these conditions can cause bleeding inside the body and can be life threatening unless diagnosed and treated promptly. CAUSES  The exact cause of an abdominal aortic aneurysm is unknown. Some contributing factors are:   A hardening of the arteries caused by the buildup of fat and other substances in the lining of a blood vessel (arteriosclerosis).  Inflammation of the walls of an artery (arteritis).   Connective tissue diseases, such as Marfan syndrome.   Abdominal trauma.   An infection, such as syphilis or staphylococcus, in the wall of the aorta (infectious aortitis) caused by bacteria. RISK FACTORS  Risk factors that contribute to an abdominal aortic aneurysm may include:  Age older than 60 years.   High blood pressure (hypertension).  Male gender.  Ethnicity (white race).  Obesity.  Family history of aneurysm (first degree relatives only).  Tobacco use. PREVENTION  The following healthy lifestyle habits may help decrease your risk of abdominal aortic aneurysm:  Quitting smoking. Smoking can raise your blood pressure and cause arteriosclerosis.  Limiting or avoiding alcohol.  Keeping your blood pressure, blood sugar level, and cholesterol levels within normal limits.  Decreasing your salt intake. In somepeople, too much salt can raise blood pressure and increase your risk of abdominal aortic aneurysm.  Eating a diet low in saturated fats and cholesterol.  Increasing your fiber intake by including  whole grains, vegetables, and fruits in your diet. Eating these foods may help lower blood pressure.  Maintaining a healthy weight.  Staying physically active and exercising regularly. SYMPTOMS  The symptoms of abdominal aortic aneurysm may vary depending on the size and rate of growth of the aneurysm.Most grow slowly and do not have any symptoms. When symptoms do occur, they may include:  Pain (abdomen, side, lower back, or groin). The pain may vary in intensity. A sudden onset of severe pain may indicate that the aneurysm has ruptured.  Feeling full after eating only small amounts of food.  Nausea or vomiting or both.  Feeling a pulsating lump in the abdomen.  Feeling faint or passing out. DIAGNOSIS  Since most unruptured abdominal aortic aneurysms have no symptoms, they are often discovered during diagnostic exams for other conditions. An aneurysm may be found during the following procedures:  Ultrasonography (A one-time screening for abdominal aortic aneurysm by ultrasonography is also recommended for all men aged 65-75 years who have ever smoked).  X-ray exams.  A computed tomography (CT).  Magnetic resonance imaging (MRI).  Angiography or arteriography. TREATMENT  Treatment of an abdominal aortic aneurysm depends on the size of your aneurysm, your age, and risk factors for rupture. Medication to control blood pressure and pain may be used to manage aneurysms smaller than 6 cm. Regular monitoring for enlargement may be recommended by your caregiver if:  The aneurysm is 3-4 cm in size (an annual ultrasonography may be recommended).  The aneurysm is 4-4.5 cm in size (an ultrasonography every 6 months may be recommended).  The aneurysm is larger than 4.5 cm in   size (your caregiver may ask that you be examined by a vascular surgeon). If your aneurysm is larger than 6 cm, surgical repair may be recommended. There are two main methods for repair of an aneurysm:   Endovascular  repair (a minimally invasive surgery). This is done most often.  Open repair. This method is used if an endovascular repair is not possible. Document Released: 04/25/2005 Document Revised: 11/10/2012 Document Reviewed: 08/15/2012 Exit[employer] Patient Information 2015 [employer]. This information is not intended to replace advice given to you by your health care provider. Make sure you discuss any questions you have with your health care provider.  

## 2014-10-26 NOTE — Progress Notes (Signed)
[facility]   Established Abdominal Aortic Aneurysm  History of Present Illness  Noah Mahoney is a 80 y.o. (May 19, 1935) male patient of Dr. Kellie Simmering who returns for continued followup regarding his abdominal aortic aneurysm.  He does have chronic back discomfort with nerve compression causing radiation of pain down his left leg. He has had spinal injections in the past.  He reports he has diverticulitis since about 2006 which causes chronic gas, denies abdominal pain. He denies claudication symptoms.  He has had a CABG in 1987 and at least one MI.  Previous studies demonstrate an AAA, measuring 3.3 cm.   The patient denies history of stroke or TIA symptoms.  He had right cataract extraction in February 2016.  Pt Diabetic: Yes,  Started metformin in March 2016 Pt smoker: former smoker, quit in 1987  His medications include a daily statin, ASA, and Plavix.  Past Medical History  Diagnosis Date  . Coronary artery disease     a. s/p CABG 1987;  b. s/p DES x 2 to SVG in 2005; c. s/p DES x 2 to SVG in 2007;  d. 2 DES placed to SVG and OM1 in 2010 ;  e. s/p PTCA mid body of SVG to OM1/OM2 due to ISR 08/13/12;  f. 10/2012 NSTEMI/Cath/PCI: native 3vd, VG->OM 99isr (3.0x16 Promus DES), LIMA->LAD nl, RIMA->RCA nl, EF 50%.  . Hypertension   . AAA (abdominal aortic aneurysm) without rupture   . Dyslipidemia (high LDL; low HDL)   . Diabetes mellitus, type 2   . Hyperlipidemia   . Gout   . Myositis   . Osteoarthritis   . Osteoporosis   . Paroxysmal supraventricular tachycardia    Past Surgical History  Procedure Laterality Date  . Cardiac surgery    . Coronary stent placement    . Prostate surgery    . Left heart catheterization with coronary angiogram N/A 08/13/2012    Procedure: LEFT HEART CATHETERIZATION WITH CORONARY ANGIOGRAM;  Surgeon: Burnell Blanks, MD;  Location: [facility] CATH LAB;  Service: Cardiovascular;  Laterality: N/A;  . Left heart  catheterization with coronary/graft angiogram N/A 11/19/2012    Procedure: LEFT HEART CATHETERIZATION WITH [name];  Surgeon: [name], MD;  Location: [facility] CATH LAB;  Service: Cardiovascular;  Laterality: N/A;   Social History History   Social History  . Marital Status: Married    Spouse Name: N/A  . Number of Children: N/A  . Years of Education: N/A   Occupational History  . Not on file.   Social History Main Topics  . Smoking status: Former Smoker    Types: Cigarettes    Quit date: 10/21/1985  . Smokeless tobacco: Never Used  . Alcohol Use: No  . Drug Use: No  . Sexual Activity: Not on file   Other Topics Concern  . Not on file   Social History Narrative   Family History Family History  Problem Relation Age of Onset  . Thyroid disease Mother   . Hypertension Father   . Aneurysm Sister     Current Outpatient Prescriptions on File Prior to Visit  Medication Sig Dispense Refill  . aspirin EC 81 MG EC tablet Take 1 tablet (81 mg total) by mouth daily. 30 tablet   . carvedilol (COREG) 6.25 MG tablet Take 1 tablet (6.25 mg total) by mouth 2 (two) times daily. 60 tablet 8  . clopidogrel (PLAVIX) 75 MG tablet Take 1 tablet (75 mg total) by mouth daily. 31 tablet  11  . hydrochlorothiazide (MICROZIDE) 12.5 MG capsule Take 12.5 mg by mouth daily.    Marland Kitchen lisinopril (PRINIVIL,ZESTRIL) 40 MG tablet Take 40 mg by mouth daily.    . Misc Natural Products (OSTEO BI-FLEX/5-LOXIN ADVANCED) TABS Take 2 tablets by mouth daily.    . nitroGLYCERIN (NITROSTAT) 0.4 MG SL tablet Place 1 tablet (0.4 mg total) under the tongue every 5 (five) minutes as needed. For chest pain 25 tablet 3  . Potassium Gluconate 550 MG TABS Take 1 tablet by mouth daily.    . rosuvastatin (CRESTOR) 10 MG tablet Take 10 mg by mouth daily.    . pantoprazole (PROTONIX) 40 MG tablet Take 40 mg by mouth daily.   5   No current facility-administered medications on file prior to visit.   Allergies   Allergen Reactions  . Lipitor [Atorvastatin]     myalgias  . Morphine And Related Itching  . Niaspan [Niacin Er]   . Pravastatin Sodium Other (See Comments)    myalgias    ROS: See HPI for pertinent positives and negatives.  Physical Examination  Filed Vitals:   10/26/14 1011  BP: 127/67  Pulse: 48  Resp: 14  Height: 6' (1.829 m)  Weight: 240 lb (108.863 kg)  SpO2: 97%   Body mass index is 32.54 kg/(m^2).  General: A&O x 3, WD, obese male.  Pulmonary: Sym exp, good air movt, CTAB, no rales, rhonchi, or wheezing.  Cardiac: RRR, Nl S1, S2, no detected murmur.   Carotid Bruits Right Left   Negative Negative   Aorta is not palpable Radial pulses are 2+ palpable and =                          VASCULAR EXAM:                                                                                                         LE Pulses Right Left       FEMORAL  not palpable, obese   1+ palpable        POPLITEAL  not palpable   not palpable       POSTERIOR TIBIAL  not palpable   not palpable        DORSALIS PEDIS      ANTERIOR TIBIAL not palpable  not palpable      Gastrointestinal: soft, NTND, -G/R, - HSM, - masses palpated, - CVAT B, large abdomen.  Musculoskeletal: M/S 5/5 throughout, Extremities without ischemic changes. Pitting edema in both lower legs: 2+ right, 1+ left.  Neurologic: CN 2-12 intact except is hard of hearing, Pain and light touch intact in extremities are intact, Motor exam as listed above.  Non-Invasive Vascular Imaging  AAA Duplex (10/26/2014) ABDOMINAL AORTA DUPLEX EVALUATION    INDICATION: Abdominal aortic aneurysm    PREVIOUS INTERVENTION(S):     DUPLEX EXAM:     LOCATION DIAMETER AP (cm) DIAMETER TRANSVERSE (cm) VELOCITIES (cm/sec)  Aorta Proximal 3.6 3.4   Aorta Mid 2.6 2.9 77  Aorta Distal 2.7  2.9 71  Right Common Iliac Artery 1.6 1.5 137  Left Common Iliac Artery 1.4 1.6 92    Previous max aortic diameter:  3.3cm Date: 10/27/13 (CT)      ADDITIONAL FINDINGS: . Decreased visualization of the abdominal vasculature due to overlying bowel gas and patient body habitus. . No hemodynamically significant stenosis of the abdominal aorta and bilateral proximal common iliac arteries.    IMPRESSION: Aneurysmal dilatation of the distal abdominal aorta with a maximum diameter of 3.6cm, based on limited visualization, as described above.     Compared to the previous exam:  No significant change in the abdominal aortic aneurysm when compared to the previous exam.      Medical Decision Making  The patient is a 80 y.o. male who presents with asymptomatic AAA with no increase in size.   Based on this patient's exam and diagnostic studies, the patient will follow up in 1 year  with the following studies: AAA Duplex.  Consideration for repair of AAA would be made when the size approaches 4.8 or 5.0 cm, growth > 1 cm/yr, and symptomatic status.  I emphasized the importance of maximal medical management including strict control of blood pressure, blood glucose, and lipid levels, antiplatelet agents, obtaining regular exercise, and continued cessation of smoking.   The patient was given information about AAA including signs, symptoms, treatment, and how to minimize the risk of enlargement and rupture of aneurysms.    The patient was advised to call 911 should the patient experience sudden onset abdominal or back pain.   Thank you for allowing [place] to participate in this patient's care.  Clemon Chambers, RN, MSN, FNP-C [facility] [place] Office: [phone]  Clinic Physician: Kellie Simmering  10/26/2014, 9:42 AM

## 2014-10-26 NOTE — Addendum Note (Signed)
Addended by: Mena Goes on: 10/26/2014 02:06 PM   Modules accepted: Orders

## 2014-11-30 ENCOUNTER — Ambulatory Visit: Payer: Medicare Other | Admitting: Cardiovascular Disease

## 2014-12-07 ENCOUNTER — Ambulatory Visit (INDEPENDENT_AMBULATORY_CARE_PROVIDER_SITE_OTHER): Payer: Medicare Other | Admitting: Cardiovascular Disease

## 2014-12-07 ENCOUNTER — Encounter: Payer: Self-pay | Admitting: Cardiovascular Disease

## 2014-12-07 VITALS — BP 154/66 | HR 55 | Ht 73.0 in | Wt 242.1 lb

## 2014-12-07 DIAGNOSIS — I714 Abdominal aortic aneurysm, without rupture, unspecified: Secondary | ICD-10-CM

## 2014-12-07 DIAGNOSIS — I10 Essential (primary) hypertension: Secondary | ICD-10-CM | POA: Diagnosis not present

## 2014-12-07 DIAGNOSIS — I25118 Atherosclerotic heart disease of native coronary artery with other forms of angina pectoris: Secondary | ICD-10-CM | POA: Diagnosis not present

## 2014-12-07 DIAGNOSIS — E785 Hyperlipidemia, unspecified: Secondary | ICD-10-CM

## 2014-12-07 DIAGNOSIS — E784 Other hyperlipidemia: Secondary | ICD-10-CM

## 2014-12-07 MED ORDER — HYDROCHLOROTHIAZIDE 25 MG PO TABS: 25.0000 mg | ORAL_TABLET | Freq: Every day | ORAL | Status: DC

## 2014-12-07 NOTE — Assessment & Plan Note (Signed)
This has been stable in size and is followed by Dr. Kellie Simmering.

## 2014-12-07 NOTE — Patient Instructions (Signed)
Medication Instructions:  Increase HCTZ (hydrocholorthiazide) to 25mg  daily.  Labwork: Your physician recommends that you have lab in 1 week--BMET-I have given you an order for this to have at your primary care doctor. Please fax the results to Dr Fletcher Anon [phone]   Testing/Procedures: None today  Follow-Up: Your physician wants you to follow-up in: 6 months with Dr Fletcher Anon. (November 2016). You will receive a reminder letter in the mail two months in advance. If you don't receive a letter, please call our office to schedule the follow-up appointment.

## 2014-12-07 NOTE — Assessment & Plan Note (Signed)
Blood pressure continues to be elevated with increased leg edema. Thus, I increased the dose of hydrochlorothiazide to 25 mg once daily. I requested basic metabolic profile to be done in one week.

## 2014-12-07 NOTE — Assessment & Plan Note (Addendum)
He is doing well overall with no symptoms suggestive of angina. I recommend continuing medical therapy. He is to stay on lifelong dual antiplatelet therapy if tolerated. EKG now shows left bundle branch block which seems to be new. However, the patient has no ischemic symptoms. Continue to monitor.

## 2014-12-07 NOTE — Assessment & Plan Note (Signed)
Continue treatment with rosuvastatin with a target LDL of less than 70. 

## 2014-12-07 NOTE — Progress Notes (Signed)
Primary care physician: Dr. Bea Graff  HPI  80 year old male with prior history of coronary artery disease status post coronary artery bypass grafting in 1987 with multiple interventions on the vein graft to the obtuse marginal.  Most recent hospitalization in April of 2014 showed severe native three-vessel coronary artery disease with patent LIMA to LAD and RIMA to RCA. SVG to OM was patent but with 99% in-stent restenosis at the site of previous Cut[place] Balloon angioplasty. He underwent angioplasty and Promus drug-eluting stent placement without complications. No cardiac events since then.  Anginal symptoms are usually manifested by left shoulder pain.  Other medical issues include medium size AAA followed annually by Dr. Kellie Simmering, GERD and arthritis.  During last visit, I switched metoprolol to carvedilol. Blood pressure continues to be elevated. He is also complaining of increased leg edema. He denies any chest pain, shortness of breath or shoulder pain. He has chronic bradycardia but overall is asymptomatic.  Allergies  Allergen Reactions  . Lipitor [Atorvastatin]     myalgias  . Morphine And Related Itching  . Niaspan [Niacin Er]     rash  . Pravastatin Sodium Other (See Comments)    myalgias     Current Outpatient Prescriptions on File Prior to Visit  Medication Sig Dispense Refill  . aspirin EC 81 MG EC tablet Take 1 tablet (81 mg total) by mouth daily. 30 tablet   . carvedilol (COREG) 6.25 MG tablet Take 1 tablet (6.25 mg total) by mouth 2 (two) times daily. 60 tablet 8  . clopidogrel (PLAVIX) 75 MG tablet Take 1 tablet (75 mg total) by mouth daily. 31 tablet 11  . hydrochlorothiazide (MICROZIDE) 12.5 MG capsule Take 12.5 mg by mouth daily.    Marland Kitchen lisinopril (PRINIVIL,ZESTRIL) 40 MG tablet Take 40 mg by mouth daily.    . metFORMIN (GLUCOPHAGE-XR) 500 MG 24 hr tablet Take 500 mg by mouth 2 (two) times daily.  3  . Misc Natural Products (OSTEO BI-FLEX/5-LOXIN ADVANCED) TABS Take 2  tablets by mouth daily.    . nitroGLYCERIN (NITROSTAT) 0.4 MG SL tablet Place 1 tablet (0.4 mg total) under the tongue every 5 (five) minutes as needed. For chest pain 25 tablet 3  . Potassium Gluconate 550 MG TABS Take 1 tablet by mouth daily.    . rosuvastatin (CRESTOR) 10 MG tablet Take 10 mg by mouth daily.     No current facility-administered medications on file prior to visit.     Past Medical History  Diagnosis Date  . Coronary artery disease     a. s/p CABG 1987;  b. s/p DES x 2 to SVG in 2005; c. s/p DES x 2 to SVG in 2007;  d. 2 DES placed to SVG and OM1 in 2010 ;  e. s/p PTCA mid body of SVG to OM1/OM2 due to ISR 08/13/12;  f. 10/2012 NSTEMI/Cath/PCI: native 3vd, VG->OM 99isr (3.0x16 Promus DES), LIMA->LAD nl, RIMA->RCA nl, EF 50%.  . Hypertension   . AAA (abdominal aortic aneurysm) without rupture   . Dyslipidemia (high LDL; low HDL)   . Diabetes mellitus, type 2   . Hyperlipidemia   . Gout   . Myositis   . Osteoarthritis   . Osteoporosis   . Paroxysmal supraventricular tachycardia      Past Surgical History  Procedure Laterality Date  . Cardiac surgery    . Coronary stent placement    . Prostate surgery    . Left heart catheterization with coronary angiogram N/A 08/13/2012  Procedure: LEFT HEART CATHETERIZATION WITH CORONARY ANGIOGRAM;  Surgeon: Burnell Blanks, MD;  Location: [facility] CATH LAB;  Service: Cardiovascular;  Laterality: N/A;  . Left heart catheterization with coronary/graft angiogram N/A 11/19/2012    Procedure: LEFT HEART CATHETERIZATION WITH [name];  Surgeon: [name], MD;  Location: [place] CATH LAB;  Service: Cardiovascular;  Laterality: N/A;  . Eye surgery Right March 2016    Cataract     Family History  Problem Relation Age of Onset  . Thyroid disease Mother   . Hypertension Father   . Aneurysm Sister      History   Social History  . Marital Status: Married    Spouse Name: N/A  . Number of Children: N/A  .  Years of Education: N/A   Occupational History  . Not on file.   Social History Main Topics  . Smoking status: Former Smoker    Types: Cigarettes    Quit date: 10/21/1985  . Smokeless tobacco: Never Used  . Alcohol Use: No  . Drug Use: No  . Sexual Activity: Not on file   Other Topics Concern  . Not on file   Social History Narrative      PHYSICAL EXAM   BP 154/66 mmHg  Pulse 55  Ht 6\' 1"  (1.854 m)  Wt 242 lb 1.9 oz (109.825 kg)  BMI 31.95 kg/m2 Constitutional: He is oriented to person, place, and time. He appears well-developed and well-nourished. No distress.  HENT: No nasal discharge.  Head: Normocephalic and atraumatic.  Eyes: Pupils are equal and round.  No discharge. Neck: Normal range of motion. Neck supple. No JVD present. No thyromegaly present.  Cardiovascular: Bradycardic, regular rhythm, normal heart sounds. Exam reveals no gallop and no friction rub. No murmur heard.  Pulmonary/Chest: Effort normal and breath sounds normal. No stridor. No respiratory distress. He has no wheezes. He has no rales. He exhibits no tenderness.  Abdominal: Soft. Bowel sounds are normal. He exhibits no distension. There is no tenderness. There is no rebound and no guarding.  Musculoskeletal: Normal range of motion. He exhibits mild edema and no tenderness.  Neurological: He is alert and oriented to person, place, and time. Coordination normal.  Skin: Skin is warm and dry. No rash noted. He is not diaphoretic. No erythema. No pallor.  Psychiatric: He has a normal mood and affect. His behavior is normal. Judgment and thought content normal.   EKG: Sinus bradycardia with left bundle branch block.      ASSESSMENT AND PLAN

## 2014-12-16 ENCOUNTER — Encounter: Payer: Self-pay | Admitting: Cardiovascular Disease

## 2015-03-01 ENCOUNTER — Other Ambulatory Visit: Payer: Self-pay | Admitting: *Deleted

## 2015-03-01 DIAGNOSIS — I25810 Atherosclerosis of coronary artery bypass graft(s) without angina pectoris: Secondary | ICD-10-CM

## 2015-03-01 DIAGNOSIS — I10 Essential (primary) hypertension: Secondary | ICD-10-CM

## 2015-03-01 MED ORDER — CARVEDILOL 6.25 MG PO TABS: 6.2500 mg | ORAL_TABLET | Freq: Two times a day (BID) | ORAL | Status: DC

## 2015-05-02 ENCOUNTER — Other Ambulatory Visit: Payer: Self-pay | Admitting: *Deleted

## 2015-05-02 DIAGNOSIS — I10 Essential (primary) hypertension: Secondary | ICD-10-CM

## 2015-05-02 DIAGNOSIS — I25810 Atherosclerosis of coronary artery bypass graft(s) without angina pectoris: Secondary | ICD-10-CM

## 2015-05-02 MED ORDER — CARVEDILOL 6.25 MG PO TABS: 6.2500 mg | ORAL_TABLET | Freq: Two times a day (BID) | ORAL | Status: DC

## 2015-05-23 ENCOUNTER — Other Ambulatory Visit: Payer: Self-pay

## 2015-05-23 DIAGNOSIS — I10 Essential (primary) hypertension: Secondary | ICD-10-CM

## 2015-05-23 MED ORDER — HYDROCHLOROTHIAZIDE 25 MG PO TABS: 25.0000 mg | ORAL_TABLET | Freq: Every day | ORAL | Status: DC

## 2015-06-07 ENCOUNTER — Encounter: Payer: Self-pay | Admitting: Cardiovascular Disease

## 2015-06-07 ENCOUNTER — Ambulatory Visit (INDEPENDENT_AMBULATORY_CARE_PROVIDER_SITE_OTHER): Payer: Medicare Other | Admitting: Cardiovascular Disease

## 2015-06-07 VITALS — BP 124/68 | HR 56 | Ht 73.0 in | Wt 236.0 lb

## 2015-06-07 DIAGNOSIS — I25810 Atherosclerosis of coronary artery bypass graft(s) without angina pectoris: Secondary | ICD-10-CM | POA: Diagnosis not present

## 2015-06-07 DIAGNOSIS — I10 Essential (primary) hypertension: Secondary | ICD-10-CM

## 2015-06-07 DIAGNOSIS — E784 Other hyperlipidemia: Secondary | ICD-10-CM | POA: Diagnosis not present

## 2015-06-07 DIAGNOSIS — E785 Hyperlipidemia, unspecified: Secondary | ICD-10-CM

## 2015-06-07 NOTE — Progress Notes (Signed)
Primary care physician: Dr. Bea Graff  HPI  80 year old male with prior history of coronary artery disease status post coronary artery bypass grafting in 1987 with multiple interventions on the vein graft to the obtuse marginal.  Most recent hospitalization in April of 2014 showed severe native three-vessel coronary artery disease with patent LIMA to LAD and RIMA to RCA. SVG to OM was patent but with 99% in-stent restenosis at the site of previous Cut[place] Balloon angioplasty. He underwent angioplasty and Promus drug-eluting stent placement without complications. No cardiac events since then.  Anginal symptoms are usually manifested by left shoulder pain.  Other medical issues include medium size AAA followed annually by Dr. Kellie Simmering, GERD and arthritis.  During last visit,  I increased the dose of hydrochlorothiazide to 25 mg once daily due to elevated blood pressure and leg edema. He has been doing better since then. He denies any anginal symptoms.  Allergies  Allergen Reactions  . Lipitor [Atorvastatin]     myalgias  . Morphine And Related Itching  . Niaspan [Niacin Er]     rash  . Pravastatin Sodium Other (See Comments)    myalgias     Current Outpatient Prescriptions on File Prior to Visit  Medication Sig Dispense Refill  . aspirin EC 81 MG EC tablet Take 1 tablet (81 mg total) by mouth daily. 30 tablet   . carvedilol (COREG) 6.25 MG tablet Take 1 tablet (6.25 mg total) by mouth 2 (two) times daily. 180 tablet 3  . clopidogrel (PLAVIX) 75 MG tablet Take 1 tablet (75 mg total) by mouth daily. 31 tablet 11  . hydrochlorothiazide (HYDRODIURIL) 25 MG tablet Take 1 tablet (25 mg total) by mouth daily. 90 tablet 3  . lisinopril (PRINIVIL,ZESTRIL) 40 MG tablet Take 40 mg by mouth daily.    . Misc Natural Products (OSTEO BI-FLEX/5-LOXIN ADVANCED) TABS Take 2 tablets by mouth daily.    . nitroGLYCERIN (NITROSTAT) 0.4 MG SL tablet Place 1 tablet (0.4 mg total) under the tongue every 5 (five)  minutes as needed. For chest pain 25 tablet 3  . Potassium Gluconate 550 MG TABS Take 1 tablet by mouth daily.    . rosuvastatin (CRESTOR) 10 MG tablet Take 10 mg by mouth daily.     No current facility-administered medications on file prior to visit.     Past Medical History  Diagnosis Date  . Coronary artery disease     a. s/p CABG 1987;  b. s/p DES x 2 to SVG in 2005; c. s/p DES x 2 to SVG in 2007;  d. 2 DES placed to SVG and OM1 in 2010 ;  e. s/p PTCA mid body of SVG to OM1/OM2 due to ISR 08/13/12;  f. 10/2012 NSTEMI/Cath/PCI: native 3vd, VG->OM 99isr (3.0x16 Promus DES), LIMA->LAD nl, RIMA->RCA nl, EF 50%.  . Hypertension   . AAA (abdominal aortic aneurysm) without rupture ([place])   . Dyslipidemia (high LDL; low HDL)   . Diabetes mellitus, type 2 ([place])   . Hyperlipidemia   . Gout   . Myositis   . Osteoarthritis   . Osteoporosis   . Paroxysmal supraventricular tachycardia [facility])      Past Surgical History  Procedure Laterality Date  . Cardiac surgery    . Coronary stent placement    . Prostate surgery    . Left heart catheterization with coronary angiogram N/A 08/13/2012    Procedure: LEFT HEART CATHETERIZATION WITH CORONARY ANGIOGRAM;  Surgeon: Burnell Blanks, MD;  Location: [facility] CATH  LAB;  Service: Cardiovascular;  Laterality: N/A;  . Left heart catheterization with coronary/graft angiogram N/A 11/19/2012    Procedure: LEFT HEART CATHETERIZATION WITH [name];  Surgeon: [name], MD;  Location: [place] CATH LAB;  Service: Cardiovascular;  Laterality: N/A;  . Eye surgery Right March 2016    Cataract     Family History  Problem Relation Age of Onset  . Thyroid disease Mother   . Hypertension Father   . Aneurysm Sister      Social History   Social History  . Marital Status: Married    Spouse Name: N/A  . Number of Children: N/A  . Years of Education: N/A   Occupational History  . Not on file.   Social History Main Topics  . Smoking  status: Former Smoker    Types: Cigarettes    Quit date: 10/21/1985  . Smokeless tobacco: Never Used  . Alcohol Use: No  . Drug Use: No  . Sexual Activity: Not on file   Other Topics Concern  . Not on file   Social History Narrative      PHYSICAL EXAM   BP 124/68 mmHg  Pulse 56  Ht 6\' 1"  (1.854 m)  Wt 236 lb (107.049 kg)  BMI 31.14 kg/m2 Constitutional: He is oriented to person, place, and time. He appears well-developed and well-nourished. No distress.  HENT: No nasal discharge.  Head: Normocephalic and atraumatic.  Eyes: Pupils are equal and round.  No discharge. Neck: Normal range of motion. Neck supple. No JVD present. No thyromegaly present.  Cardiovascular: Bradycardic, regular rhythm, normal heart sounds. Exam reveals no gallop and no friction rub. No murmur heard.  Pulmonary/Chest: Effort normal and breath sounds normal. No stridor. No respiratory distress. He has no wheezes. He has no rales. He exhibits no tenderness.  Abdominal: Soft. Bowel sounds are normal. He exhibits no distension. There is no tenderness. There is no rebound and no guarding.  Musculoskeletal: Normal range of motion. He exhibits mild edema and no tenderness.  Neurological: He is alert and oriented to person, place, and time. Coordination normal.  Skin: Skin is warm and dry. No rash noted. He is not diaphoretic. No erythema. No pallor.  Psychiatric: He has a normal mood and affect. His behavior is normal. Judgment and thought content normal.       ASSESSMENT AND PLAN

## 2015-06-07 NOTE — Assessment & Plan Note (Signed)
Continue treatment with rosuvastatin with a target LDL of less than 70. 

## 2015-06-07 NOTE — Assessment & Plan Note (Signed)
Blood pressure is well controlled now on current medications. 

## 2015-06-07 NOTE — Patient Instructions (Signed)

## 2015-06-07 NOTE — Assessment & Plan Note (Signed)
He is doing well overall with no symptoms suggestive of angina. I recommend continuing medical therapy. He is to stay on lifelong dual antiplatelet therapy if possible given multiple PCI's on vein grafts.

## 2015-07-27 ENCOUNTER — Telehealth: Payer: Self-pay | Admitting: Cardiovascular Disease

## 2015-07-27 DIAGNOSIS — I25810 Atherosclerosis of coronary artery bypass graft(s) without angina pectoris: Secondary | ICD-10-CM

## 2015-07-27 DIAGNOSIS — I10 Essential (primary) hypertension: Secondary | ICD-10-CM

## 2015-07-27 MED ORDER — HYDROCHLOROTHIAZIDE 25 MG PO TABS: 25.0000 mg | ORAL_TABLET | Freq: Every day | ORAL | Status: DC

## 2015-07-27 MED ORDER — CARVEDILOL 6.25 MG PO TABS: 6.2500 mg | ORAL_TABLET | Freq: Two times a day (BID) | ORAL | Status: DC

## 2015-07-27 NOTE — Telephone Encounter (Signed)
New message       *STAT* If patient is at the pharmacy, call can be transferred to refill team.   1. Which medications need to be refilled? (please list name of each medication and dose if known) hydrocholorothiazide 25 mg  / carvediolol 6.25 mg   2. Which pharmacy/location (including street and city if local pharmacy) is medication to be sent to? [facility] -fax # [phone]  3. Do they need a 30 day or 90 day supply? 90 days for both medication

## 2015-08-11 ENCOUNTER — Telehealth: Payer: Self-pay | Admitting: Cardiovascular Disease

## 2015-08-11 DIAGNOSIS — I10 Essential (primary) hypertension: Secondary | ICD-10-CM

## 2015-08-11 DIAGNOSIS — I25810 Atherosclerosis of coronary artery bypass graft(s) without angina pectoris: Secondary | ICD-10-CM

## 2015-08-11 MED ORDER — CARVEDILOL 6.25 MG PO TABS: 6.2500 mg | ORAL_TABLET | Freq: Two times a day (BID) | ORAL | Status: DC

## 2015-08-11 MED ORDER — HYDROCHLOROTHIAZIDE 25 MG PO TABS: 25.0000 mg | ORAL_TABLET | Freq: Every day | ORAL | Status: DC

## 2015-08-11 NOTE — Telephone Encounter (Signed)
NewMessage   *STAT* If patient is at the pharmacy, call can be transferred to refill team.   1. Which medications need to be refilled? (please list name of each medication and dose if known) hyrdrochlorothiazide- 25 mg,carvedilol 6.25 mg   2. Which pharmacy/location (including street and city if local pharmacy) is medication to be sent to?[facility]- [facility]- [phone]- to call directly- new pharmacy   3. Do they need a 30 day or 90 day supply? [place]

## 2015-08-11 NOTE — Telephone Encounter (Signed)
Pt's Rx sent to pt's pharmacy as requested. Confirmation received.  °

## 2015-11-01 ENCOUNTER — Encounter: Payer: Self-pay | Admitting: Family

## 2015-11-08 ENCOUNTER — Encounter: Payer: Self-pay | Admitting: Family

## 2015-11-08 ENCOUNTER — Ambulatory Visit (INDEPENDENT_AMBULATORY_CARE_PROVIDER_SITE_OTHER): Payer: Medicare Other | Admitting: Family

## 2015-11-08 ENCOUNTER — Ambulatory Visit (HOSPITAL_COMMUNITY)
Admission: RE | Admit: 2015-11-08 | Discharge: 2015-11-08 | Disposition: A | Discharge status: Home / Self Care | Payer: Medicare Other | Source: Ambulatory Visit | Attending: Family | Admitting: Family

## 2015-11-08 VITALS — BP 136/76 | HR 50 | Temp 97.3°F | Resp 18 | Ht 73.0 in | Wt 238.0 lb

## 2015-11-08 DIAGNOSIS — I25810 Atherosclerosis of coronary artery bypass graft(s) without angina pectoris: Secondary | ICD-10-CM | POA: Diagnosis not present

## 2015-11-08 DIAGNOSIS — E785 Hyperlipidemia, unspecified: Secondary | ICD-10-CM | POA: Insufficient documentation

## 2015-11-08 DIAGNOSIS — Z87891 Personal history of nicotine dependence: Secondary | ICD-10-CM

## 2015-11-08 DIAGNOSIS — I714 Abdominal aortic aneurysm, without rupture, unspecified: Secondary | ICD-10-CM

## 2015-11-08 DIAGNOSIS — E119 Type 2 diabetes mellitus without complications: Secondary | ICD-10-CM | POA: Diagnosis not present

## 2015-11-08 DIAGNOSIS — I2510 Atherosclerotic heart disease of native coronary artery without angina pectoris: Secondary | ICD-10-CM | POA: Insufficient documentation

## 2015-11-08 DIAGNOSIS — I10 Essential (primary) hypertension: Secondary | ICD-10-CM | POA: Diagnosis not present

## 2015-11-08 NOTE — Patient Instructions (Signed)
Abdominal Aortic Aneurysm An aneurysm is a weakened or damaged part of an artery wall that bulges from the normal force of blood pumping through the body. An abdominal aortic aneurysm is an aneurysm that occurs in the lower part of the aorta, the main artery of the body.  The major concern with an abdominal aortic aneurysm is that it can enlarge and burst (rupture) or blood can flow between the layers of the wall of the aorta through a tear (aorticdissection). Both of these conditions can cause bleeding inside the body and can be life threatening unless diagnosed and treated promptly. CAUSES  The exact cause of an abdominal aortic aneurysm is unknown. Some contributing factors are:   A hardening of the arteries caused by the buildup of fat and other substances in the lining of a blood vessel (arteriosclerosis).  Inflammation of the walls of an artery (arteritis).   Connective tissue diseases, such as Marfan syndrome.   Abdominal trauma.   An infection, such as syphilis or staphylococcus, in the wall of the aorta (infectious aortitis) caused by bacteria. RISK FACTORS  Risk factors that contribute to an abdominal aortic aneurysm may include:  Age older than 60 years.   High blood pressure (hypertension).  Male gender.  Ethnicity (white race).  Obesity.  Family history of aneurysm (first degree relatives only).  Tobacco use. PREVENTION  The following healthy lifestyle habits may help decrease your risk of abdominal aortic aneurysm:  Quitting smoking. Smoking can raise your blood pressure and cause arteriosclerosis.  Limiting or avoiding alcohol.  Keeping your blood pressure, blood sugar level, and cholesterol levels within normal limits.  Decreasing your salt intake. In somepeople, too much salt can raise blood pressure and increase your risk of abdominal aortic aneurysm.  Eating a diet low in saturated fats and cholesterol.  Increasing your fiber intake by including  whole grains, vegetables, and fruits in your diet. Eating these foods may help lower blood pressure.  Maintaining a healthy weight.  Staying physically active and exercising regularly. SYMPTOMS  The symptoms of abdominal aortic aneurysm may vary depending on the size and rate of growth of the aneurysm.Most grow slowly and do not have any symptoms. When symptoms do occur, they may include:  Pain (abdomen, side, lower back, or groin). The pain may vary in intensity. A sudden onset of severe pain may indicate that the aneurysm has ruptured.  Feeling full after eating only small amounts of food.  Nausea or vomiting or both.  Feeling a pulsating lump in the abdomen.  Feeling faint or passing out. DIAGNOSIS  Since most unruptured abdominal aortic aneurysms have no symptoms, they are often discovered during diagnostic exams for other conditions. An aneurysm may be found during the following procedures:  Ultrasonography (A one-time screening for abdominal aortic aneurysm by ultrasonography is also recommended for all men aged 65-75 years who have ever smoked).  X-ray exams.  A computed tomography (CT).  Magnetic resonance imaging (MRI).  Angiography or arteriography. TREATMENT  Treatment of an abdominal aortic aneurysm depends on the size of your aneurysm, your age, and risk factors for rupture. Medication to control blood pressure and pain may be used to manage aneurysms smaller than 6 cm. Regular monitoring for enlargement may be recommended by your caregiver if:  The aneurysm is 3-4 cm in size (an annual ultrasonography may be recommended).  The aneurysm is 4-4.5 cm in size (an ultrasonography every 6 months may be recommended).  The aneurysm is larger than 4.5 cm in   size (your caregiver may ask that you be examined by a vascular surgeon). If your aneurysm is larger than 6 cm, surgical repair may be recommended. There are two main methods for repair of an aneurysm:   Endovascular  repair (a minimally invasive surgery). This is done most often.  Open repair. This method is used if an endovascular repair is not possible.   This information is not intended to replace advice given to you by your health care provider. Make sure you discuss any questions you have with your health care provider.   Document Released: 04/25/2005 Document Revised: 11/10/2012 Document Reviewed: 08/15/2012 Elsevier Interactive Patient Education 2016 [employer]  

## 2015-11-08 NOTE — Progress Notes (Signed)
[facility] [place]  Established Abdominal Aortic Aneurysm  History of Present Illness  Noah Mahoney is a 81 y.o. (08-Dec-1934) male patient of Dr. Kellie Simmering who returns for continued followup regarding his abdominal aortic aneurysm.  Previous studies demonstrate an AAA, measuring 3.3 cm.   He does have chronic back discomfort with nerve compression causing radiation of pain down his left leg. He has had spinal injections in the past.  He reports he has diverticulitis since about 2006 which causes chronic gas, denies abdominal pain.  He has had a CABG in 1987 and at least one MI.  The patient denies history of stroke or TIA symptoms. He denies claudication symptoms.    Pt Diabetic: Yes, Started metformin in March 2016 Pt smoker: former smoker, quit in 1987  His medications include a daily statin, ASA, and Plavix   Past Medical History  Diagnosis Date  . Coronary artery disease     a. s/p CABG 1987;  b. s/p DES x 2 to SVG in 2005; c. s/p DES x 2 to SVG in 2007;  d. 2 DES placed to SVG and OM1 in 2010 ;  e. s/p PTCA mid body of SVG to OM1/OM2 due to ISR 08/13/12;  f. 10/2012 NSTEMI/Cath/PCI: native 3vd, VG->OM 99isr (3.0x16 Promus DES), LIMA->LAD nl, RIMA->RCA nl, EF 50%.  . Hypertension   . AAA (abdominal aortic aneurysm) without rupture ([place])   . Dyslipidemia (high LDL; low HDL)   . Diabetes mellitus, type 2 ([place])   . Hyperlipidemia   . Gout   . Myositis   . Osteoarthritis   . Osteoporosis   . Paroxysmal supraventricular tachycardia [facility])    Past Surgical History  Procedure Laterality Date  . Cardiac surgery    . Coronary stent placement    . Prostate surgery    . Left heart catheterization with coronary angiogram N/A 08/13/2012    Procedure: LEFT HEART CATHETERIZATION WITH CORONARY ANGIOGRAM;  Surgeon: Burnell Blanks, MD;  Location: [facility] CATH LAB;  Service: Cardiovascular;  Laterality: N/A;  . Left heart catheterization with coronary/graft  angiogram N/A 11/19/2012    Procedure: LEFT HEART CATHETERIZATION WITH [name];  Surgeon: [name], MD;  Location: [place] CATH LAB;  Service: Cardiovascular;  Laterality: N/A;  . Eye surgery Right March 2016    Cataract   Social History Social History   Social History  . Marital Status: Married    Spouse Name: N/A  . Number of Children: N/A  . Years of Education: N/A   Occupational History  . Not on file.   Social History Main Topics  . Smoking status: Former Smoker    Types: Cigarettes    Quit date: 10/21/1985  . Smokeless tobacco: Never Used  . Alcohol Use: No  . Drug Use: No  . Sexual Activity: Not on file   Other Topics Concern  . Not on file   Social History Narrative   Family History Family History  Problem Relation Age of Onset  . Thyroid disease Mother   . Hypertension Father   . Aneurysm Sister     Current Outpatient Prescriptions on File Prior to Visit  Medication Sig Dispense Refill  . aspirin EC 81 MG EC tablet Take 1 tablet (81 mg total) by mouth daily. 30 tablet   . carvedilol (COREG) 6.25 MG tablet Take 1 tablet (6.25 mg total) by mouth 2 (two) times daily. 180 tablet 3  . clopidogrel (PLAVIX) 75 MG tablet Take 1 tablet (  75 mg total) by mouth daily. 31 tablet 11  . hydrochlorothiazide (HYDRODIURIL) 25 MG tablet Take 1 tablet (25 mg total) by mouth daily. 90 tablet 3  . lisinopril (PRINIVIL,ZESTRIL) 40 MG tablet Take 40 mg by mouth daily.    . Misc Natural Products (OSTEO BI-FLEX/5-LOXIN ADVANCED) TABS Take 2 tablets by mouth daily.    . nitroGLYCERIN (NITROSTAT) 0.4 MG SL tablet Place 1 tablet (0.4 mg total) under the tongue every 5 (five) minutes as needed. For chest pain 25 tablet 3  . Potassium Gluconate 550 MG TABS Take 1 tablet by mouth daily.    . rosuvastatin (CRESTOR) 10 MG tablet Take 10 mg by mouth daily.     No current facility-administered medications on file prior to visit.   Allergies  Allergen Reactions  . Lipitor  [Atorvastatin]     myalgias  . Morphine And Related Itching  . Niaspan [Niacin Er]     rash  . Pravastatin Sodium Other (See Comments)    myalgias    ROS: See HPI for pertinent positives and negatives.  Physical Examination  Filed Vitals:   11/08/15 0928  BP: 136/76  Pulse: 50  Temp: 97.3 F (36.3 C)  TempSrc: Oral  Resp: 18  Height: 6\' 1"  (1.854 m)  Weight: 238 lb (107.956 kg)  SpO2: 96%   Body mass index is 31.41 kg/(m^2).  General: A&O x 3, WD, obese male.  Pulmonary: Sym exp, good air movt, CTAB, no rales, rhonchi, or wheezing.  Cardiac: RRR, Nl S1, S2, no detected murmur.   Carotid Bruits Right Left   Negative Negative  Aorta is not palpable Radial pulses are 2+ palpable and =   VASCULAR EXAM:     LE Pulses Right Left   FEMORAL 2+ palpable 2+ palpable    POPLITEAL not palpable  not palpable   POSTERIOR TIBIAL not palpable   palpable    DORSALIS PEDIS  ANTERIOR TIBIAL not palpable   palpable      Gastrointestinal: soft, NTND, -G/R, - HSM, - masses palpated, - CVAT B, large abdomen.  Musculoskeletal: M/S 5/5 throughout, Extremities without ischemic changes. 2+pitting edema in both ankles.  Neurologic: CN 2-12 intact except is hard of hearing, Pain and light touch intact in extremities are intact, Motor exam as listed above.        CT abd/pelvis 10/27/13: IMPRESSION: Mild aneurysmal disease of the infrarenal abdominal aorta with a bilobed appearance. Maximal diameter of aneurysmal disease is 3.3 cm. The suprarenal aorta shows mild dilatation at the level of the celiac axis and just above the celiac axis, measuring up to 3.3 cm.    Non-Invasive Vascular Imaging  AAA Duplex (11/08/2015) ABDOMINAL AORTA DUPLEX EVALUATION    INDICATION:  Abdominal aortic aneurysm    PREVIOUS INTERVENTION(S): None     DUPLEX EXAM:     LOCATION DIAMETER AP (cm) DIAMETER TRANSVERSE (cm) VELOCITIES (cm/sec)  Aorta Proximal 3.0 2.6 -  Aorta Mid 3.0 3.0 28  Aorta Distal 4.0 - 42  Right Common Iliac Artery N/V -   Left Common Iliac Artery N/V -     Previous max aortic diameter:  3.6 x 3.4 Date: 10/26/2014     ADDITIONAL FINDINGS: 4.9 x 6.5 cm right renal cyst    IMPRESSION: 1. Suboptimal exam, limited visualization due to body habitus and excessive bowel gas 2. Largest diameter of the distal aorta 4.0 cms AP.     Medical Decision Making  The patient is a 81 y.o. male who presents  with asymptomatic AAA with  A slight ncrease in size to 4 cm, suboptimal exam, limited visualization due to body habitus and excessive bowel gas.    Based on this patient's exam and diagnostic studies, and after discussing today's duplex results and 2015 CTA abd/pelvis results, the patient will follow up in 1 year  with the following studies: AAA duplex.  Consideration for repair of AAA would be made when the size is 5.5 cm, growth > 1 cm/yr, and symptomatic status.  I emphasized the importance of maximal medical management including strict control of blood pressure, blood glucose, and lipid levels, antiplatelet agents, obtaining regular exercise, and continued cessation of smoking.   The patient was given information about AAA including signs, symptoms, treatment, and how to minimize the risk of enlargement and rupture of aneurysms.    The patient was advised to call 911 should the patient experience sudden onset abdominal or back pain.   Thank you for allowing [place] to participate in this patient's care.  Clemon Chambers, RN, MSN, FNP-C [facility] [place] Office: [phone]  Clinic Physician: Kellie Simmering  11/08/2015, 9:39 AM

## 2015-12-06 ENCOUNTER — Ambulatory Visit (INDEPENDENT_AMBULATORY_CARE_PROVIDER_SITE_OTHER): Payer: Medicare Other | Admitting: Cardiovascular Disease

## 2015-12-06 ENCOUNTER — Encounter: Payer: Self-pay | Admitting: Cardiovascular Disease

## 2015-12-06 VITALS — BP 121/62 | HR 58 | Ht 73.0 in | Wt 242.0 lb

## 2015-12-06 DIAGNOSIS — I25810 Atherosclerosis of coronary artery bypass graft(s) without angina pectoris: Secondary | ICD-10-CM

## 2015-12-06 NOTE — Patient Instructions (Signed)

## 2015-12-06 NOTE — Progress Notes (Signed)
Cardiology Office Note   Date:  12/06/2015   ID:  AMBER SCHILLIG, DOB Feb 13, 1935, MRN [id number]  PCP:  Gilford Rile, MD  Cardiologist:   [name], MD   Chief Complaint  Patient presents with  . Follow-up    SOB;none.CHEST PAIN;none. LIGHTHEADED/DIZZINESS;none. PAIN OR CRAMPING IN LEGS;none. EDEMA; in feet and legs      History of Present Illness: Noah Mahoney is a 81 y.o. male who presents for a follow-up visit regarding coronary artery disease. He is status post coronary artery bypass grafting in 1987 with multiple interventions on the vein graft to the obtuse marginal.  Most recent hospitalization in April of 2014 showed severe native three-vessel coronary artery disease with patent LIMA to LAD and RIMA to RCA. SVG to OM was patent but with 99% in-stent restenosis at the site of previous [place] angioplasty. He underwent angioplasty and Promus drug-eluting stent placement without complications. No cardiac events since then.  Anginal symptoms are usually manifested by left shoulder pain.  Other medical issues include medium size AAA followed annually by Dr. Kellie Simmering, left bundle branch block, GERD and arthritis.  He was diagnosed recently with type 2 diabetes and was started on glyburide. He reports some GI symptoms with the medication. Otherwise he is doing well with no chest pain, shoulder pain or significant increase in dyspnea.   Past Medical History  Diagnosis Date  . Coronary artery disease     a. s/p CABG 1987;  b. s/p DES x 2 to SVG in 2005; c. s/p DES x 2 to SVG in 2007;  d. 2 DES placed to SVG and OM1 in 2010 ;  e. s/p PTCA mid body of SVG to OM1/OM2 due to ISR 08/13/12;  f. 10/2012 NSTEMI/Cath/PCI: native 3vd, VG->OM 99isr (3.0x16 Promus DES), LIMA->LAD nl, RIMA->RCA nl, EF 50%.  . Hypertension   . AAA (abdominal aortic aneurysm) without rupture ([place])   . Dyslipidemia (high LDL; low HDL)   . Diabetes mellitus, type 2 ([place])   . Hyperlipidemia   . Gout     . Myositis   . Osteoarthritis   . Osteoporosis   . Paroxysmal supraventricular tachycardia [facility])     Past Surgical History  Procedure Laterality Date  . Cardiac surgery    . Coronary stent placement    . Prostate surgery    . Left heart catheterization with coronary angiogram N/A 08/13/2012    Procedure: LEFT HEART CATHETERIZATION WITH CORONARY ANGIOGRAM;  Surgeon: Burnell Blanks, MD;  Location: [facility] CATH LAB;  Service: Cardiovascular;  Laterality: N/A;  . Left heart catheterization with coronary/graft angiogram N/A 11/19/2012    Procedure: LEFT HEART CATHETERIZATION WITH [name];  Surgeon: [name], MD;  Location: [place] CATH LAB;  Service: Cardiovascular;  Laterality: N/A;  . Eye surgery Right March 2016    Cataract     Current Outpatient Prescriptions  Medication Sig Dispense Refill  . aspirin EC 81 MG EC tablet Take 1 tablet (81 mg total) by mouth daily. 30 tablet   . carvedilol (COREG) 6.25 MG tablet Take 1 tablet (6.25 mg total) by mouth 2 (two) times daily. 180 tablet 3  . clopidogrel (PLAVIX) 75 MG tablet Take 1 tablet (75 mg total) by mouth daily. 31 tablet 11  . glimepiride (AMARYL) 4 MG tablet Take 4 mg by mouth daily.    . hydrochlorothiazide (HYDRODIURIL) 25 MG tablet Take 1 tablet (25 mg total) by mouth daily. 90 tablet 3  . lisinopril (PRINIVIL,ZESTRIL)  40 MG tablet Take 40 mg by mouth daily.    . Misc Natural Products (OSTEO BI-FLEX/5-LOXIN ADVANCED) TABS Take 2 tablets by mouth daily.    . nitroGLYCERIN (NITROSTAT) 0.4 MG SL tablet Place 1 tablet (0.4 mg total) under the tongue every 5 (five) minutes as needed. For chest pain 25 tablet 3  . Omega-3 Fatty Acids (FISH OIL PO) Take by mouth.    . Potassium Gluconate 550 MG TABS Take 1 tablet by mouth daily.    . rosuvastatin (CRESTOR) 10 MG tablet Take 10 mg by mouth daily.     No current facility-administered medications for this visit.    Allergies:   Lipitor; Morphine and related;  Niaspan; and Pravastatin sodium    Social History:  The patient  reports that he quit smoking about 30 years ago. His smoking use included Cigarettes. He has never used smokeless tobacco. He reports that he does not drink alcohol or use illicit drugs.   Family History:  The patient's family history includes Aneurysm in his sister; Hypertension in his father; Thyroid disease in his mother.    ROS:  Please see the history of present illness.   Otherwise, review of systems are positive for none.   All other systems are reviewed and negative.    PHYSICAL EXAM: VS:  BP 121/62 mmHg  Pulse 58  Ht 6\' 1"  (1.854 m)  Wt 242 lb (109.77 kg)  BMI 31.93 kg/m2 , BMI Body mass index is 31.93 kg/(m^2). GEN: Well nourished, well developed, in no acute distress HEENT: normal Neck: no JVD, carotid bruits, or masses Cardiac: RRR; no murmurs, rubs, or gallops,no edema  Respiratory:  clear to auscultation bilaterally, normal work of breathing GI: soft, nontender, nondistended, + BS MS: no deformity or atrophy Skin: warm and dry, no rash Neuro:  Strength and sensation are intact Psych: euthymic mood, full affect   EKG:  EKG is ordered today. The ekg ordered today demonstrates sinus bradycardia with left bundle branch block.   Recent Labs: No results found for requested labs within last 365 days.    Lipid Panel    Component Value Date/Time   CHOL 108 08/13/2012 0735   TRIG 127 08/13/2012 0735   HDL 25* 08/13/2012 0735   CHOLHDL 4.3 08/13/2012 0735   VLDL 25 08/13/2012 0735   LDLCALC 58 08/13/2012 0735      Wt Readings from Last 3 Encounters:  12/06/15 242 lb (109.77 kg)  11/08/15 238 lb (107.956 kg)  06/07/15 236 lb (107.049 kg)        ASSESSMENT AND PLAN:  1.  Coronary artery disease involving bypass graft without angina: The patient is doing very well from a cardiac standpoint with no anginal symptoms. Continue treatment of risk factors and lifelong dual antiplatelet therapy given  multiple PCI's on his vein graft.  2. Essential hypertension: Blood pressure is well controlled on current medications.  3. Hyperlipidemia: Continue treatment with rosuvastatin with a target LDL of less than 70. He gets his labs done with his primary care physician.  4. Abdominal aortic aneurysm: This was still relatively small in size and followed by [facility].     Disposition:   FU with me in 1 year  Signed,  [name], MD  12/06/2015 11:03 AM    [place]

## 2015-12-08 NOTE — Addendum Note (Signed)
Addended by: Dorothyann Gibbs on: 12/08/2015 09:55 AM   Modules accepted: Orders

## 2016-11-22 ENCOUNTER — Encounter: Payer: Self-pay | Admitting: Family

## 2016-12-04 ENCOUNTER — Ambulatory Visit (INDEPENDENT_AMBULATORY_CARE_PROVIDER_SITE_OTHER): Payer: Medicare Other | Admitting: Family

## 2016-12-04 ENCOUNTER — Encounter: Payer: Self-pay | Admitting: Family

## 2016-12-04 ENCOUNTER — Ambulatory Visit (HOSPITAL_COMMUNITY)
Admission: RE | Admit: 2016-12-04 | Discharge: 2016-12-04 | Disposition: A | Discharge status: Home / Self Care | Payer: Medicare Other | Source: Ambulatory Visit | Attending: Family | Admitting: Family

## 2016-12-04 VITALS — BP 139/66 | HR 48 | Temp 97.6°F | Resp 16 | Ht 73.0 in | Wt 235.0 lb

## 2016-12-04 DIAGNOSIS — I714 Abdominal aortic aneurysm, without rupture, unspecified: Secondary | ICD-10-CM

## 2016-12-04 DIAGNOSIS — Z87891 Personal history of nicotine dependence: Secondary | ICD-10-CM | POA: Diagnosis not present

## 2016-12-04 NOTE — Progress Notes (Signed)
[facility] [place]   CC: Follow up Abdominal Aortic Aneurysm  History of Present Illness  Noah Mahoney is a 82 y.o. (04/23/1935) male patient of Dr. Kellie Simmering who returns for continued followup regarding his abdominal aortic aneurysm.  Previous studies demonstrate an AAA, measuring 3.3 cm.   He does have chronic back discomfort with nerve compression causing radiation of pain down his left leg. He has had spinal injections in the past.  He reports he has diverticulitis since about 2006 which causes chronic gas, denies abdominal pain.  He has had a CABG in 1987 and at least one MI.  The patient denies history of stroke or TIA symptoms. He denies claudication symptoms.    Pt Diabetic: Yes,started metformin in March 2016 Pt smoker: former smoker, quit in 1987  His medications include a daily statin, ASA, and Plavix   Past Medical History:  Diagnosis Date  . AAA (abdominal aortic aneurysm) without rupture ([place])   . Coronary artery disease    a. s/p CABG 1987;  b. s/p DES x 2 to SVG in 2005; c. s/p DES x 2 to SVG in 2007;  d. 2 DES placed to SVG and OM1 in 2010 ;  e. s/p PTCA mid body of SVG to OM1/OM2 due to ISR 08/13/12;  f. 10/2012 NSTEMI/Cath/PCI: native 3vd, VG->OM 99isr (3.0x16 Promus DES), LIMA->LAD nl, RIMA->RCA nl, EF 50%.  . Diabetes mellitus, type 2 ([place])   . Dyslipidemia (high LDL; low HDL)   . Gout   . Hyperlipidemia   . Hypertension   . Myositis   . Osteoarthritis   . Osteoporosis   . Paroxysmal supraventricular tachycardia [facility])    Past Surgical History:  Procedure Laterality Date  . CARDIAC SURGERY    . CORONARY STENT PLACEMENT    . EYE SURGERY Right March 2016   Cataract  . LEFT HEART CATHETERIZATION WITH CORONARY ANGIOGRAM N/A 08/13/2012   Procedure: LEFT HEART CATHETERIZATION WITH CORONARY ANGIOGRAM;  Surgeon: Burnell Blanks, MD;  Location: [facility] CATH LAB;  Service: Cardiovascular;  Laterality: N/A;  . LEFT HEART  CATHETERIZATION WITH CORONARY/GRAFT ANGIOGRAM N/A 11/19/2012   Procedure: LEFT HEART CATHETERIZATION WITH [name];  Surgeon: [name], MD;  Location: [place] CATH LAB;  Service: Cardiovascular;  Laterality: N/A;  . PROSTATE SURGERY     Social History Social History   Social History  . Marital status: Married    Spouse name: N/A  . Number of children: N/A  . Years of education: N/A   Occupational History  . Not on file.   Social History Main Topics  . Smoking status: Former Smoker    Types: Cigarettes    Quit date: 10/21/1985  . Smokeless tobacco: Never Used  . Alcohol use No  . Drug use: No  . Sexual activity: Not on file   Other Topics Concern  . Not on file   Social History Narrative  . No narrative on file   Family History Family History  Problem Relation Age of Onset  . Thyroid disease Mother   . Hypertension Father   . Aneurysm Sister     Current Outpatient Prescriptions on File Prior to Visit  Medication Sig Dispense Refill  . aspirin EC 81 MG EC tablet Take 1 tablet (81 mg total) by mouth daily. 30 tablet   . carvedilol (COREG) 6.25 MG tablet Take 1 tablet (6.25 mg total) by mouth 2 (two) times daily. 180 tablet 3  . clopidogrel (PLAVIX) 75 MG  tablet Take 1 tablet (75 mg total) by mouth daily. 31 tablet 11  . glimepiride (AMARYL) 2 MG tablet Take 2 mg by mouth daily.     . hydrochlorothiazide (HYDRODIURIL) 25 MG tablet Take 1 tablet (25 mg total) by mouth daily. 90 tablet 3  . lisinopril (PRINIVIL,ZESTRIL) 40 MG tablet Take 40 mg by mouth daily.    . Misc Natural Products (OSTEO BI-FLEX/5-LOXIN ADVANCED) TABS Take 2 tablets by mouth daily.    . nitroGLYCERIN (NITROSTAT) 0.4 MG SL tablet Place 1 tablet (0.4 mg total) under the tongue every 5 (five) minutes as needed. For chest pain 25 tablet 3  . Omega-3 Fatty Acids (FISH OIL PO) Take by mouth.    . Potassium Gluconate 550 MG TABS Take 1 tablet by mouth daily.    . rosuvastatin (CRESTOR) 10 MG  tablet Take 10 mg by mouth daily.     No current facility-administered medications on file prior to visit.    Allergies  Allergen Reactions  . Lipitor [Atorvastatin]     myalgias  . Metformin Other (See Comments)    unknown  . Morphine And Related Itching  . Niaspan [Niacin Er]     rash  . Pravastatin Sodium Other (See Comments)    myalgias    ROS: See HPI for pertinent positives and negatives.  Physical Examination  Vitals:   12/04/16 0919  BP: 139/66  Pulse: (!) 48  Resp: 16  Temp: 97.6 F (36.4 C)  TempSrc: Oral  SpO2: 96%  Weight: 235 lb (106.6 kg)  Height: 6\' 1"  (1.854 m)   Body mass index is 31 kg/m.  General: A&O x 3, WD, obese male.  Pulmonary: Sym exp, respirations are non labored, fair air movt, CTAB, no rales, rhonchi, or wheezing.  Cardiac: Regular rhythm with occasional premature contractions, bradycardic (on a beta blocker), no detected murmur.   Carotid Bruits Right Left   Negative Negative  Aorta is not palpable Radial pulses are 2+ palpable and =   VASCULAR EXAM:     LE Pulses Right Left   FEMORAL 2+ palpable 2+ palpable    POPLITEAL not palpable  not palpable   POSTERIOR TIBIAL not palpable   not palpable    DORSALIS PEDIS  ANTERIOR TIBIAL 1+ palpable   2+palpable      Gastrointestinal: soft, NTND, -G/R, - HSM, - masses palpated, - CVAT B, large abdomen.  Musculoskeletal: M/S 5/5 throughout, Extremities without ischemic changes. 2+pitting edema in both ankles.  Neurologic: CN 2-12 intact except is hard of hearing, Pain and light touch intact in extremities are intact, Motor exam as listed above     CT abd/pelvis 10/27/13: IMPRESSION: Mild aneurysmal disease of the infrarenal abdominal aorta with a bilobed  appearance. Maximal diameter of aneurysmal disease is 3.3 cm. The suprarenal aorta shows mild dilatation at the level of the celiac axis and just above the celiac axis, measuring up to 3.3 cm   Non-Invasive Vascular Imaging  AAA Duplex (12/04/2016)  Previous size: 4 cm (Date: 11-08-15);  bilateral common iliac arteries not visualized.    Current size:  4.14 cm (Date: 12-04-16); bilateral common iliac arteries not visualized. Decreased visualization of the abdominal vasculature due to overlying bowel gas and patient body habitus. Patent aorta and bilateral iliac system with difficulty visualizing the bilateral common iliac arteries.   Medical Decision Making  The patient is a 82 y.o. male who presents with asymptomatic AAA with no increase in size, based on limited visualization. Most, if  not all of his abdominal ultrasounds have been obscured by bowel gas.  2015 CTA abd/pelvis demonstrated an infrarenal abdominal aortic aneurysm at 3.3 cm; see above results.   12-16-14: 0.72 serum creatinine, as last on file.    Based on this patient's exam and diagnostic studies, and after discussing with Dr. Donnetta Hutching, the patient will follow up in 1 year with the following studies: CTA abd/pelvis.  Consideration for repair of AAA would be made when the size is 5.0 cm, growth > 1 cm/yr, and symptomatic status.  I emphasized the importance of maximal medical management including strict control of blood pressure, blood glucose, and lipid levels, antiplatelet agents, obtaining regular exercise, and continued cessation of smoking.   The patient was given information about AAA including signs, symptoms, treatment, and how to minimize the risk of enlargement and rupture of aneurysms.    The patient was advised to call 911 should the patient experience sudden onset abdominal or back pain.   Thank you for allowing [place] to participate in this patient's care.  Clemon Chambers, RN, MSN, FNP-C [facility] [place] Office: [phone]  Clinic Physician: Early  12/04/2016, 9:32 AM

## 2016-12-04 NOTE — Patient Instructions (Addendum)
Abdominal Aortic Aneurysm Blood pumps away from the heart through tubes (blood vessels) called arteries. Aneurysms are weak or damaged places in the wall of an artery. It bulges out like a balloon. An abdominal aortic aneurysm happens in the main artery of the body (aorta). It can burst or tear, causing bleeding inside the body. This is an emergency. It needs treatment right away. What are the causes? The exact cause is unknown. Things that could cause this problem include:  Fat and other substances building up in the lining of a tube.  Swelling of the walls of a blood vessel.  Certain tissue diseases.  Belly (abdominal) trauma.  An infection in the main artery of the body.  What increases the risk? There are things that make it more likely for you to have an aneurysm. These include:  Being over the age of 80 years old.  Having high blood pressure (hypertension).  Being a male.  Being white.  Being very overweight (obese).  Having a family history of aneurysm.  Using tobacco products.  What are the signs or symptoms? Symptoms depend on the size of the aneurysm and how fast it grows. There may not be symptoms. If symptoms occur, they can include:  Pain (belly, side, lower back, or groin).  Feeling full after eating a small amount of food.  Feeling sick to your stomach (nauseous), throwing up (vomiting), or both.  Feeling a lump in your belly that feels like it is beating (pulsating).  Feeling like you will pass out (faint).  How is this treated?  Medicine to control blood pressure and pain.  Imaging tests to see if the aneurysm gets bigger.  Surgery. How is this prevented? To lessen your chance of getting this condition:  Stop smoking. Stop chewing tobacco.  Limit or avoid alcohol.  Keep your blood pressure, blood sugar, and cholesterol within normal limits.  Eat less salt.  Eat foods low in saturated fats and cholesterol. These are found in animal and  whole dairy products.  Eat more fiber. Fiber is found in whole grains, vegetables, and fruits.  Keep a healthy weight.  Stay active and exercise often.  This information is not intended to replace advice given to you by your health care provider. Make sure you discuss any questions you have with your health care provider. Document Released: 11/10/2012 Document Revised: 12/22/2015 Document Reviewed: 08/15/2012 Elsevier Interactive Patient Education  2017 [employer]  

## 2016-12-10 NOTE — Addendum Note (Signed)
Addended by: Lianne Cure A on: 12/10/2016 10:23 AM   Modules accepted: Orders

## 2016-12-11 ENCOUNTER — Ambulatory Visit (INDEPENDENT_AMBULATORY_CARE_PROVIDER_SITE_OTHER): Payer: Medicare Other | Admitting: Cardiovascular Disease

## 2016-12-11 VITALS — BP 110/66 | HR 53 | Ht 73.0 in | Wt 240.8 lb

## 2016-12-11 DIAGNOSIS — I10 Essential (primary) hypertension: Secondary | ICD-10-CM | POA: Diagnosis not present

## 2016-12-11 DIAGNOSIS — I714 Abdominal aortic aneurysm, without rupture, unspecified: Secondary | ICD-10-CM

## 2016-12-11 DIAGNOSIS — E785 Hyperlipidemia, unspecified: Secondary | ICD-10-CM | POA: Diagnosis not present

## 2016-12-11 DIAGNOSIS — I25810 Atherosclerosis of coronary artery bypass graft(s) without angina pectoris: Secondary | ICD-10-CM

## 2016-12-11 NOTE — Progress Notes (Signed)
Cardiology Office Note   Date:  12/11/2016   ID:  ELDRA WORD, DOB 1935/05/22, MRN [id number]  PCP:  [name], MD  Cardiologist:   [name], MD   Chief Complaint  Patient presents with  . Follow-up    pt denied chest pain and SOB      History of Present Illness: Noah Mahoney is a 82 y.o. male who presents for a follow-up visit regarding coronary artery disease. He is status post coronary artery bypass grafting in 1987 with multiple interventions on the vein graft to the obtuse marginal.  Most recent hospitalization in April of 2014 showed severe native three-vessel coronary artery disease with patent LIMA to LAD and RIMA to RCA. SVG to OM was patent but with 99% in-stent restenosis at the site of previous Cutting Balloon angioplasty. He underwent angioplasty and Promus drug-eluting stent placement without complications. No cardiac events since then.  Anginal symptoms are usually manifested by left shoulder pain.  Other medical issues include medium size AAA followed annually by [facility], left bundle branch block, type 2 diabetes, GERD and arthritis.  He has been doing reasonably well and denies any chest pain or significant dyspnea. His biggest issue seems to be arthritis. His wife died in Aug 18, 2023.  Past Medical History:  Diagnosis Date  . AAA (abdominal aortic aneurysm) without rupture ([place])   . Coronary artery disease    a. s/p CABG 1987;  b. s/p DES x 2 to SVG in 2005; c. s/p DES x 2 to SVG in 2007;  d. 2 DES placed to SVG and OM1 in 2010 ;  e. s/p PTCA mid body of SVG to OM1/OM2 due to ISR 08/13/12;  f. 10/2012 NSTEMI/Cath/PCI: native 3vd, VG->OM 99isr (3.0x16 Promus DES), LIMA->LAD nl, RIMA->RCA nl, EF 50%.  . Diabetes mellitus, type 2 ([place])   . Dyslipidemia (high LDL; low HDL)   . Gout   . Hyperlipidemia   . Hypertension   . Myositis   . Osteoarthritis   . Osteoporosis   . Paroxysmal supraventricular tachycardia [facility])     Past Surgical History:    Procedure Laterality Date  . CARDIAC SURGERY    . CORONARY STENT PLACEMENT    . EYE SURGERY Right March 2016   Cataract  . LEFT HEART CATHETERIZATION WITH CORONARY ANGIOGRAM N/A 08/13/2012   Procedure: LEFT HEART CATHETERIZATION WITH CORONARY ANGIOGRAM;  Surgeon: Burnell Blanks, MD;  Location: [facility] CATH LAB;  Service: Cardiovascular;  Laterality: N/A;  . LEFT HEART CATHETERIZATION WITH CORONARY/GRAFT ANGIOGRAM N/A 11/19/2012   Procedure: LEFT HEART CATHETERIZATION WITH [name];  Surgeon: [name], MD;  Location: [facility] CATH LAB;  Service: Cardiovascular;  Laterality: N/A;  . PROSTATE SURGERY       Current Outpatient Prescriptions  Medication Sig Dispense Refill  . aspirin EC 81 MG EC tablet Take 1 tablet (81 mg total) by mouth daily. 30 tablet   . carvedilol (COREG) 6.25 MG tablet Take 1 tablet (6.25 mg total) by mouth 2 (two) times daily. 180 tablet 3  . clopidogrel (PLAVIX) 75 MG tablet Take 1 tablet (75 mg total) by mouth daily. 31 tablet 11  . cyanocobalamin (,VITAMIN B-12,) 1000 MCG/ML injection Inject 1,000 mcg into the muscle every 30 (thirty) days.    Marland Kitchen glimepiride (AMARYL) 2 MG tablet Take 2 mg by mouth daily.     . hydrochlorothiazide (HYDRODIURIL) 25 MG tablet Take 1 tablet (25 mg total) by mouth daily. 90 tablet 3  .  lisinopril (PRINIVIL,ZESTRIL) 40 MG tablet Take 40 mg by mouth daily.    . Misc Natural Products (OSTEO BI-FLEX/5-LOXIN ADVANCED) TABS Take 2 tablets by mouth daily.    . nitroGLYCERIN (NITROSTAT) 0.4 MG SL tablet Place 1 tablet (0.4 mg total) under the tongue every 5 (five) minutes as needed. For chest pain 25 tablet 3  . Omega-3 Fatty Acids (FISH OIL PO) Take by mouth.    . Potassium Gluconate 550 MG TABS Take 1 tablet by mouth daily.    . rosuvastatin (CRESTOR) 10 MG tablet Take 10 mg by mouth daily.     No current facility-administered medications for this visit.     Allergies:   Lipitor [atorvastatin]; Metformin; Morphine and  related; Niaspan [niacin er]; and Pravastatin sodium    Social History:  The patient  reports that he quit smoking about 31 years ago. His smoking use included Cigarettes. He has never used smokeless tobacco. He reports that he does not drink alcohol or use drugs.   Family History:  The patient's family history includes Aneurysm in his sister; Hypertension in his father; Thyroid disease in his mother.    ROS:  Please see the history of present illness.   Otherwise, review of systems are positive for none.   All other systems are reviewed and negative.    PHYSICAL EXAM: VS:  BP 110/66   Pulse (!) 53   Ht 6\' 1"  (1.854 m)   Wt 240 lb 12.8 oz (109.2 kg)   BMI 31.77 kg/m  , BMI Body mass index is 31.77 kg/m. GEN: Well nourished, well developed, in no acute distress  HEENT: normal  Neck: no JVD, carotid bruits, or masses Cardiac: RRR; no murmurs, rubs, or gallops,no edema  Respiratory:  clear to auscultation bilaterally, normal work of breathing GI: soft, nontender, nondistended, + BS MS: no deformity or atrophy  Skin: warm and dry, no rash Neuro:  Strength and sensation are intact Psych: euthymic mood, full affect   EKG:  EKG is ordered today. The ekg ordered today demonstrates sinus bradycardia with left bundle branch block.   Recent Labs: No results found for requested labs within last 8760 hours.    Lipid Panel    Component Value Date/Time   CHOL 108 08/13/2012 0735   TRIG 127 08/13/2012 0735   HDL 25 (L) 08/13/2012 0735   CHOLHDL 4.3 08/13/2012 0735   VLDL 25 08/13/2012 0735   LDLCALC 58 08/13/2012 0735      Wt Readings from Last 3 Encounters:  12/11/16 240 lb 12.8 oz (109.2 kg)  12/04/16 235 lb (106.6 kg)  12/06/15 242 lb (109.8 kg)        ASSESSMENT AND PLAN:  1.  Coronary artery disease involving bypass graft without angina: The patient is doing very well from a cardiac standpoint with no anginal symptoms. Continue treatment of risk factors and lifelong  dual antiplatelet therapy given multiple PCI's on his vein graft.  2. Essential hypertension: Blood pressure is well controlled on current medications.  3. Hyperlipidemia: Continue treatment with rosuvastatin with a target LDL of less than 70. He gets his labs done with his primary care physician.  4. Abdominal aortic aneurysm:  followed by [facility]. Stable in size.   5. Erectile dysfunction with previous prostate surgery: Given his age and comorbidities, I advised against a phosphodiesterase inhibitor.  Disposition:   FU with me in 1 year  Signed,  [name], MD  12/11/2016 9:06 AM    [facility]  

## 2016-12-11 NOTE — Patient Instructions (Signed)

## 2017-11-14 DIAGNOSIS — J811 Chronic pulmonary edema: Secondary | ICD-10-CM | POA: Diagnosis not present

## 2017-11-14 DIAGNOSIS — K219 Gastro-esophageal reflux disease without esophagitis: Secondary | ICD-10-CM

## 2017-11-14 DIAGNOSIS — E119 Type 2 diabetes mellitus without complications: Secondary | ICD-10-CM | POA: Diagnosis not present

## 2017-11-14 DIAGNOSIS — I509 Heart failure, unspecified: Secondary | ICD-10-CM

## 2017-11-14 DIAGNOSIS — R739 Hyperglycemia, unspecified: Secondary | ICD-10-CM

## 2017-11-15 DIAGNOSIS — K219 Gastro-esophageal reflux disease without esophagitis: Secondary | ICD-10-CM | POA: Diagnosis not present

## 2017-11-15 DIAGNOSIS — R0602 Shortness of breath: Secondary | ICD-10-CM

## 2017-11-15 DIAGNOSIS — I509 Heart failure, unspecified: Secondary | ICD-10-CM | POA: Diagnosis not present

## 2017-11-15 DIAGNOSIS — J811 Chronic pulmonary edema: Secondary | ICD-10-CM | POA: Diagnosis not present

## 2017-11-15 DIAGNOSIS — E119 Type 2 diabetes mellitus without complications: Secondary | ICD-10-CM | POA: Diagnosis not present

## 2017-11-15 DIAGNOSIS — R739 Hyperglycemia, unspecified: Secondary | ICD-10-CM | POA: Diagnosis not present

## 2017-11-16 ENCOUNTER — Encounter: Payer: Self-pay | Admitting: Internal Medicine

## 2017-11-16 DIAGNOSIS — J811 Chronic pulmonary edema: Secondary | ICD-10-CM | POA: Diagnosis not present

## 2017-11-16 DIAGNOSIS — R739 Hyperglycemia, unspecified: Secondary | ICD-10-CM | POA: Diagnosis not present

## 2017-11-16 DIAGNOSIS — K219 Gastro-esophageal reflux disease without esophagitis: Secondary | ICD-10-CM | POA: Diagnosis not present

## 2017-11-16 DIAGNOSIS — R0602 Shortness of breath: Secondary | ICD-10-CM | POA: Diagnosis not present

## 2017-11-16 DIAGNOSIS — E119 Type 2 diabetes mellitus without complications: Secondary | ICD-10-CM | POA: Diagnosis not present

## 2017-11-16 DIAGNOSIS — I509 Heart failure, unspecified: Secondary | ICD-10-CM | POA: Diagnosis not present

## 2017-11-17 DIAGNOSIS — R739 Hyperglycemia, unspecified: Secondary | ICD-10-CM | POA: Diagnosis not present

## 2017-11-17 DIAGNOSIS — E119 Type 2 diabetes mellitus without complications: Secondary | ICD-10-CM | POA: Diagnosis not present

## 2017-11-17 DIAGNOSIS — I509 Heart failure, unspecified: Secondary | ICD-10-CM

## 2017-11-17 DIAGNOSIS — K219 Gastro-esophageal reflux disease without esophagitis: Secondary | ICD-10-CM | POA: Diagnosis not present

## 2017-11-18 DIAGNOSIS — I509 Heart failure, unspecified: Secondary | ICD-10-CM | POA: Diagnosis not present

## 2017-11-18 DIAGNOSIS — E119 Type 2 diabetes mellitus without complications: Secondary | ICD-10-CM | POA: Diagnosis not present

## 2017-11-18 DIAGNOSIS — R739 Hyperglycemia, unspecified: Secondary | ICD-10-CM | POA: Diagnosis not present

## 2017-11-18 DIAGNOSIS — K219 Gastro-esophageal reflux disease without esophagitis: Secondary | ICD-10-CM | POA: Diagnosis not present

## 2017-11-20 ENCOUNTER — Ambulatory Visit (INDEPENDENT_AMBULATORY_CARE_PROVIDER_SITE_OTHER): Payer: Medicare Other | Admitting: Physician Assistant

## 2017-11-20 ENCOUNTER — Encounter: Payer: Self-pay | Admitting: Physician Assistant

## 2017-11-20 VITALS — BP 124/62 | HR 76 | Ht 73.0 in | Wt 246.0 lb

## 2017-11-20 DIAGNOSIS — I10 Essential (primary) hypertension: Secondary | ICD-10-CM

## 2017-11-20 DIAGNOSIS — E119 Type 2 diabetes mellitus without complications: Secondary | ICD-10-CM | POA: Diagnosis not present

## 2017-11-20 DIAGNOSIS — I25810 Atherosclerosis of coronary artery bypass graft(s) without angina pectoris: Secondary | ICD-10-CM | POA: Diagnosis not present

## 2017-11-20 DIAGNOSIS — I481 Persistent atrial fibrillation: Secondary | ICD-10-CM | POA: Diagnosis not present

## 2017-11-20 DIAGNOSIS — I714 Abdominal aortic aneurysm, without rupture, unspecified: Secondary | ICD-10-CM

## 2017-11-20 DIAGNOSIS — E785 Hyperlipidemia, unspecified: Secondary | ICD-10-CM | POA: Diagnosis not present

## 2017-11-20 DIAGNOSIS — I4819 Other persistent atrial fibrillation: Secondary | ICD-10-CM

## 2017-11-20 MED ORDER — FUROSEMIDE 40 MG PO TABS: ORAL_TABLET | ORAL | 3 refills | Status: DC

## 2017-11-20 MED ORDER — POTASSIUM CHLORIDE ER 10 MEQ PO TBCR: EXTENDED_RELEASE_TABLET | ORAL | 3 refills | Status: DC

## 2017-11-20 MED ORDER — LISINOPRIL 10 MG PO TABS: 10.0000 mg | ORAL_TABLET | Freq: Every day | ORAL | 3 refills | Status: DC

## 2017-11-20 NOTE — Patient Instructions (Signed)
Medication Instructions:  INCREASE Lisinopril to 10mg  take 1 tablet once a day INCREASE Lasix to 80mg  in the morning and 40 mg in the evening  INCREASE Potassium to 2meq in the morning and 100meq in the evening   Labwork: Your physician recommends that you return for lab work in: 1 week BMP  Testing/Procedures: None   Follow-Up: Keep upcoming appointment as scheduled  Any Other Special Instructions Will Be Listed Below (If Applicable).  If you need a refill on your cardiac medications before your next appointment, please call your pharmacy.

## 2017-11-20 NOTE — Progress Notes (Signed)
Cardiology Office Note    Date:  11/20/2017   ID:  [name], DOB 1934/08/27, MRN [id number]  PCP:  [name], MD  Cardiologist:  Dr. Fletcher Anon   Chief Complaint  Patient presents with  . Hospitalization Follow-up    discharged x2 days ago, irregular heartbeat, changed medications, pt complains of swelling in hands in feet, SOB.     History of Present Illness:  Noah Mahoney is a 83 y.o. male with PMH of CAD s/p CABG 1987, chronic LBBB, HTN, HLD, DM II, AAA, and paroxysmal supraventricular tachycardia.  He has had multiple intervention on the vein graft to the OM.  Last cardiac catheterization in April 2014 showed severe native three-vessel disease with patent LIMA to LAD, RIMA to RCA, 99% in-stent restenosis in the SVG to OM at the site of previous cutting balloon angioplasty.  He eventually underwent angioplasty and Promus DES placement.  Echocardiogram obtained on 08/13/2012 showed EF 60-65%.  His usual anginal symptom is left shoulder pain.  His AAA is being followed on an annual basis by vascular surgery.  Last ultrasound obtained in May 2018 showed largest diameter of distal aorta measuring 4.14 cm, difficulty visualizing the distal aorta and appears to have thrombus in this area.  CT angiogram of abdomen and pelvis was ordered by vascular surgery, however it does not appears to have been done.  His last office visit with Dr. Fletcher Anon was on 12/11/2016 at which time he was doing well.   Patient was recently admitted to [facility] for atrial fibrillation and acute heart failure.  Unfortunately I do not have the record.  Several of her medication has been readjusted.  Carvedilol is currently on 3.125 mg twice daily.  Lisinopril has been reduced to 5 mg daily from the previous 40 mg daily.  Based on patient's report, it appears he was told his heart was weak.  I suspect patient was in atrial fibrillation and recent echocardiogram showed new LV dysfunction.  He has been placed on  Eliquis 5 mg twice daily.  He was also discharged on 40 mg twice daily of Lasix along with 10 mEq daily of potassium.  Although patient says he has been taking 10 mg twice daily of potassium.  On physical exam today, patient continues to have bilateral 2+ ankle edema.  He also has bilateral basilar crackles on physical exam as well.  His heart rate is fairly controlled at this time on the low-dose carvedilol.  I will increase his Lasix to 80 mg a.m. and 40 mg p.m.  I will also increase his potassium to 20 meq a.m. and 10 mEq p.m.  I will continue to uptitrate his heart failure medication by increasing lisinopril to 10 mg daily.  He will follow [place] back in 3 weeks, if he is still in atrial fibrillation at that time, we will arrange outpatient DCCV.     Past Medical History:  Diagnosis Date  . AAA (abdominal aortic aneurysm) without rupture ([place])   . Coronary artery disease    a. s/p CABG 1987;  b. s/p DES x 2 to SVG in 2005; c. s/p DES x 2 to SVG in 2007;  d. 2 DES placed to SVG and OM1 in 2010 ;  e. s/p PTCA mid body of SVG to OM1/OM2 due to ISR 08/13/12;  f. 10/2012 NSTEMI/Cath/PCI: native 3vd, VG->OM 99isr (3.0x16 Promus DES), LIMA->LAD nl, RIMA->RCA nl, EF 50%.  . Diabetes mellitus, type 2 ([place])   . Dyslipidemia (  high LDL; low HDL)   . Gout   . Hyperlipidemia   . Hypertension   . Myositis   . Osteoarthritis   . Osteoporosis   . Paroxysmal supraventricular tachycardia [facility])     Past Surgical History:  Procedure Laterality Date  . CARDIAC SURGERY    . CORONARY STENT PLACEMENT    . EYE SURGERY Right March 2016   Cataract  . LEFT HEART CATHETERIZATION WITH CORONARY ANGIOGRAM N/A 08/13/2012   Procedure: LEFT HEART CATHETERIZATION WITH CORONARY ANGIOGRAM;  Surgeon: Burnell Blanks, MD;  Location[facility] CATH LAB;  Service: Cardiovascular;  Laterality: N/A;  . LEFT HEART CATHETERIZATION WITH CORONARY/GRAFT ANGIOGRAM N/A 11/19/2012   Procedure: LEFT HEART CATHETERIZATION WITH [name];  Surgeon: [name], MD;  Location: [facility] CATH LAB;  Service: Cardiovascular;  Laterality: N/A;  . PROSTATE SURGERY      Current Medications: Outpatient Medications Prior to Visit  Medication Sig Dispense Refill  . apixaban (ELIQUIS) 5 MG TABS tablet Take 5 mg by mouth 2 (two) times daily.    . carvedilol (COREG) 3.125 MG tablet Take 3.125 mg by mouth 2 (two) times daily with a meal.    . cyanocobalamin (,VITAMIN B-12,) 1000 MCG/ML injection Inject 1,000 mcg into the muscle every 30 (thirty) days.    Marland Kitchen glimepiride (AMARYL) 2 MG tablet Take 2 mg by mouth daily.     . hydrochlorothiazide (HYDRODIURIL) 25 MG tablet Take 1 tablet (25 mg total) by mouth daily. 90 tablet 3  . isosorbide mononitrate (IMDUR) 30 MG 24 hr tablet Take 30 mg by mouth daily.    . Misc Natural Products (OSTEO BI-FLEX/5-LOXIN ADVANCED) TABS Take 2 tablets by mouth daily.    . nitroGLYCERIN (NITROSTAT) 0.4 MG SL tablet Place 1 tablet (0.4 mg total) under the tongue every 5 (five) minutes as needed. For chest pain 25 tablet 3  . Omega-3 Fatty Acids (FISH OIL PO) Take by mouth.    . pantoprazole (PROTONIX) 40 MG tablet Take 40 mg by mouth daily.    . rosuvastatin (CRESTOR) 10 MG tablet Take 10 mg by mouth daily.    Marland Kitchen spironolactone (ALDACTONE) 25 MG tablet Take 25 mg by mouth daily.    . furosemide (LASIX) 40 MG tablet Take 40 mg by mouth 2 (two) times daily.    Marland Kitchen lisinopril (PRINIVIL,ZESTRIL) 5 MG tablet Take 5 mg by mouth daily.    . potassium chloride (K-DUR) 10 MEQ tablet Take 10 mEq by mouth daily.    Marland Kitchen aspirin EC 81 MG EC tablet Take 1 tablet (81 mg total) by mouth daily. (Patient not taking: Reported on 11/20/2017) 30 tablet   . carvedilol (COREG) 6.25 MG tablet Take 1 tablet (6.25 mg total) by mouth 2 (two) times daily. (Patient not taking: Reported on 11/20/2017) 180 tablet 3  . clopidogrel (PLAVIX) 75 MG tablet Take 1 tablet (75 mg total) by mouth daily. (Patient not taking: Reported on 11/20/2017) 31  tablet 11  . lisinopril (PRINIVIL,ZESTRIL) 40 MG tablet Take 40 mg by mouth daily.    . Potassium Gluconate 550 MG TABS Take 1 tablet by mouth daily.     No facility-administered medications prior to visit.      Allergies:   Lipitor [atorvastatin]; Metformin; Morphine and related; Niaspan [niacin er]; and Pravastatin sodium   Social History   Socioeconomic History  . Marital status: Married    Spouse name: Not on file  . Number of children: Not on file  . Years of  education: Not on file  . Highest education level: Not on file  Occupational History  . Not on file  Social Needs  . Financial resource strain: Not on file  . Food insecurity:    Worry: Not on file    Inability: Not on file  . Transportation needs:    Medical: Not on file    Non-medical: Not on file  Tobacco Use  . Smoking status: Former Smoker    Types: Cigarettes    Last attempt to quit: 10/21/1985    Years since quitting: 32.1  . Smokeless tobacco: Never Used  Substance and Sexual Activity  . Alcohol use: No  . Drug use: No  . Sexual activity: Not on file  Lifestyle  . Physical activity:    Days per week: Not on file    Minutes per session: Not on file  . Stress: Not on file  Relationships  . Social connections:    Talks on phone: Not on file    Gets together: Not on file    Attends religious service: Not on file    Active member of club or organization: Not on file    Attends meetings of clubs or organizations: Not on file    Relationship status: Not on file  Other Topics Concern  . Not on file  Social History Narrative  . Not on file     Family History:  The patient's family history includes Aneurysm in his sister; Hypertension in his father; Thyroid disease in his mother.   ROS:   Please see the history of present illness.    ROS All other systems reviewed and are negative.   PHYSICAL EXAM:   VS:  BP 124/62   Pulse 76   Ht 6\' 1"  (1.854 m)   Wt 246 lb (111.6 kg)   BMI 32.46 kg/m      GEN: Well nourished, well developed, in no acute distress  HEENT: normal  Neck: no JVD, carotid bruits, or masses Cardiac: irregularly irregular; no murmurs, rubs, or gallops. 3+ edema in bilateral ankle Respiratory:  clear to auscultation bilaterally, normal work of breathing GI: soft, nontender, nondistended, + BS MS: no deformity or atrophy  Skin: warm and dry, no rash Neuro:  Alert and Oriented x 3, Strength and sensation are intact Psych: euthymic mood, full affect  Wt Readings from Last 3 Encounters:  11/20/17 246 lb (111.6 kg)  12/11/16 240 lb 12.8 oz (109.2 kg)  12/04/16 235 lb (106.6 kg)      Studies/Labs Reviewed:   EKG:  EKG is ordered today.  The ekg ordered today demonstrates atrial fibrillation, chronic left bundle branch block, PVC  Recent Labs: No results found for requested labs within last 8760 hours.   Lipid Panel    Component Value Date/Time   CHOL 108 08/13/2012 0735   TRIG 127 08/13/2012 0735   HDL 25 (L) 08/13/2012 0735   CHOLHDL 4.3 08/13/2012 0735   VLDL 25 08/13/2012 0735   LDLCALC 58 08/13/2012 0735    Additional studies/ records that were reviewed today include:   Echo 08/13/2012 LV EF: 60% -  65% Study Conclusions  - Left ventricle: The cavity size was mildly dilated. Wall thickness was normal. Systolic function was normal. The estimated ejection fraction was in the range of 60% to 65%. - Left atrium: The atrium was mildly dilated.    ASSESSMENT:    1. Persistent atrial fibrillation ([place])   2. Coronary artery disease involving coronary bypass graft of  native heart without angina pectoris   3. Essential hypertension   4. Hyperlipidemia, unspecified hyperlipidemia type   5. Controlled type 2 diabetes mellitus without complication, without long-term current use of insulin ([place])   6. AAA (abdominal aortic aneurysm) without rupture ([facility])      PLAN:  In order of problems listed above:  1. Persistent atrial fibrillation:  Newly diagnosed recently, based on the onset of his symptom, likely has been going on for several months.  He is no longer taking aspirin and Plavix, instead he has been placed on Eliquis 5 mg twice daily.  We discussed the need to be compliant with NOAC.  He will return in 3 weeks for reassessment, if he is still in atrial fibrillation at that time, will need to consider outpatient DCCV  2. Acute on chronic systolic HF  -Based on patient report, it sounds like he was recently diagnosed with LV dysfunction on echocardiogram.  He still appears to be volume overloaded, will increase Lasix to 80 mg a.m. and 40 mg p.m.  Will also increase potassium to 20 mEq a.m. and 10 mg in the p.m.  3. CAD s/p CABG: No longer on aspirin and Plavix given the need for Eliquis.  Continue Crestor  4. Hypertension: Blood pressure stable.  Increase lisinopril to 10 mg daily for likely LV dysfunction  5. Hyperlipidemia: On Crestor 10 mg daily, liver function normal in December 2018.  However last lipid panel appears to be in September 2016 under [facility].  We will need to recheck fasting lipid panel on follow-up.  6. DM 2: Managed by primary care provider.  7. AAA: Followed by vascular surgery.    Medication Adjustments/Labs and Tests Ordered: Current medicines are reviewed at length with the patient today.  Concerns regarding medicines are outlined above.  Medication changes, Labs and Tests ordered today are listed in the Patient Instructions below. Patient Instructions  Medication Instructions:  INCREASE Lisinopril to 10mg  take 1 tablet once a day INCREASE Lasix to 80mg  in the morning and 40 mg in the evening  INCREASE Potassium to 45meq in the morning and 20meq in the evening   Labwork: Your physician recommends that you return for lab work in: 1 week BMP  Testing/Procedures: None   Follow-Up: Keep upcoming appointment as scheduled  Any Other Special Instructions Will Be Listed Below (If  Applicable).  If you need a refill on your cardiac medications before your next appointment, please call your pharmacy.      Hilbert Corrigan, [place]  11/20/2017 1:39 PM    [facility] [address], [place], [place]  [address] Phone: [phone]; Fax: [phone]

## 2017-11-22 ENCOUNTER — Telehealth: Payer: Self-pay | Admitting: Cardiovascular Disease

## 2017-11-22 NOTE — Telephone Encounter (Signed)
Records received from [facility] on 11/22/17, Appt 12/10/17 @ 8:40AM. [place]

## 2017-12-10 ENCOUNTER — Encounter: Payer: Self-pay | Admitting: Cardiovascular Disease

## 2017-12-10 ENCOUNTER — Encounter

## 2017-12-10 ENCOUNTER — Ambulatory Visit (INDEPENDENT_AMBULATORY_CARE_PROVIDER_SITE_OTHER): Payer: Medicare Other | Admitting: Cardiovascular Disease

## 2017-12-10 VITALS — BP 90/62 | HR 134 | Ht 73.0 in | Wt 232.0 lb

## 2017-12-10 DIAGNOSIS — I2510 Atherosclerotic heart disease of native coronary artery without angina pectoris: Secondary | ICD-10-CM

## 2017-12-10 DIAGNOSIS — I4819 Other persistent atrial fibrillation: Secondary | ICD-10-CM

## 2017-12-10 DIAGNOSIS — E785 Hyperlipidemia, unspecified: Secondary | ICD-10-CM

## 2017-12-10 DIAGNOSIS — Z01812 Encounter for preprocedural laboratory examination: Secondary | ICD-10-CM

## 2017-12-10 DIAGNOSIS — R5383 Other fatigue: Secondary | ICD-10-CM

## 2017-12-10 DIAGNOSIS — I481 Persistent atrial fibrillation: Secondary | ICD-10-CM | POA: Diagnosis not present

## 2017-12-10 DIAGNOSIS — I25810 Atherosclerosis of coronary artery bypass graft(s) without angina pectoris: Secondary | ICD-10-CM

## 2017-12-10 DIAGNOSIS — I5022 Chronic systolic (congestive) heart failure: Secondary | ICD-10-CM

## 2017-12-10 DIAGNOSIS — D689 Coagulation defect, unspecified: Secondary | ICD-10-CM | POA: Diagnosis not present

## 2017-12-10 LAB — BASIC METABOLIC PANEL
BUN/Creatinine Ratio: 24 (ref 10–24)
BUN: 37 mg/dL — ABNORMAL HIGH (ref 8–27)
CO2: 20 mmol/L (ref 20–29)
Calcium: 9.2 mg/dL (ref 8.6–10.2)
Chloride: 101 mmol/L (ref 96–106)
Creatinine, Ser: 1.57 mg/dL — ABNORMAL HIGH (ref 0.76–1.27)
GFR, EST AFRICAN AMERICAN: 47 mL/min/{1.73_m2} — AB (ref >59)
GFR, EST NON AFRICAN AMERICAN: 41 mL/min/{1.73_m2} — AB (ref >59)
Glucose: 135 mg/dL — ABNORMAL HIGH (ref 65–99)
POTASSIUM: 5 mmol/L (ref 3.5–5.2)
SODIUM: 136 mmol/L (ref 134–144)

## 2017-12-10 LAB — CBC
Hematocrit: 39.9 % (ref 37.5–51.0)
Hemoglobin: 13.6 g/dL (ref 13.0–17.7)
MCH: 32.3 pg (ref 26.6–33.0)
MCHC: 34.1 g/dL (ref 31.5–35.7)
MCV: 95 fL (ref 79–97)
PLATELETS: 247 10*3/uL (ref 150–379)
RBC: 4.21 x10E6/uL (ref 4.14–5.80)
RDW: 14.3 % (ref 12.3–15.4)
WBC: 7.1 10*3/uL (ref 3.4–10.8)

## 2017-12-10 LAB — PROTIME-INR
INR: 1.2 (ref 0.8–1.2)
Prothrombin Time: 11.9 s (ref 9.1–12.0)

## 2017-12-10 LAB — TSH: TSH: 1.17 u[IU]/mL (ref 0.450–4.500)

## 2017-12-10 MED ORDER — CARVEDILOL 3.125 MG PO TABS: 3.1250 mg | ORAL_TABLET | Freq: Two times a day (BID) | ORAL | 3 refills | Status: DC

## 2017-12-10 MED ORDER — APIXABAN 5 MG PO TABS: 5.0000 mg | ORAL_TABLET | Freq: Two times a day (BID) | ORAL | 0 refills | Status: DC

## 2017-12-10 MED ORDER — ISOSORBIDE MONONITRATE ER 30 MG PO TB24: 30.0000 mg | ORAL_TABLET | Freq: Every day | ORAL | 0 refills | Status: DC

## 2017-12-10 MED ORDER — ROSUVASTATIN CALCIUM 10 MG PO TABS: 10.0000 mg | ORAL_TABLET | Freq: Every day | ORAL | 0 refills | Status: DC

## 2017-12-10 MED ORDER — AMIODARONE HCL 200 MG PO TABS: 200.0000 mg | ORAL_TABLET | Freq: Two times a day (BID) | ORAL | 0 refills | Status: DC

## 2017-12-10 MED ORDER — ROSUVASTATIN CALCIUM 10 MG PO TABS: 10.0000 mg | ORAL_TABLET | Freq: Every day | ORAL | 3 refills | Status: DC

## 2017-12-10 MED ORDER — CARVEDILOL 3.125 MG PO TABS: 3.1250 mg | ORAL_TABLET | Freq: Two times a day (BID) | ORAL | 0 refills | Status: DC

## 2017-12-10 MED ORDER — ISOSORBIDE MONONITRATE ER 30 MG PO TB24: 30.0000 mg | ORAL_TABLET | Freq: Every day | ORAL | 3 refills | Status: DC

## 2017-12-10 MED ORDER — SPIRONOLACTONE 25 MG PO TABS: 25.0000 mg | ORAL_TABLET | Freq: Every day | ORAL | 0 refills | Status: DC

## 2017-12-10 MED ORDER — SPIRONOLACTONE 25 MG PO TABS: 25.0000 mg | ORAL_TABLET | Freq: Every day | ORAL | 3 refills | Status: DC

## 2017-12-10 MED ORDER — AMIODARONE HCL 200 MG PO TABS: 200.0000 mg | ORAL_TABLET | Freq: Two times a day (BID) | ORAL | 3 refills | Status: DC

## 2017-12-10 MED ORDER — APIXABAN 5 MG PO TABS: 5.0000 mg | ORAL_TABLET | Freq: Two times a day (BID) | ORAL | 3 refills | Status: DC

## 2017-12-10 NOTE — H&P (View-Only) (Signed)
Cardiology Office Note   Date:  12/10/2017   ID:  Noah Mahoney, DOB [birth year], MRN [id number]  PCP:  [name], MD  Cardiologist:   [name], MD   Chief Complaint  Patient presents with  . Follow-up    1 year.      History of Present Illness: Noah Mahoney is a 83 y.o. male who presents for a follow-up visit regarding coronary artery disease  And recently diagnosed atrial fibrillation with systolic heart failure. He is status post coronary artery bypass grafting in 1987 with multiple interventions on the vein graft to the obtuse marginal.  Most recent hospitalization in April of 2014 showed severe native three-vessel coronary artery disease with patent LIMA to LAD and RIMA to RCA. SVG to OM was patent but with 99% in-stent restenosis at the site of previous Cutting Balloon angioplasty. He underwent angioplasty and Promus drug-eluting stent placement without complications. No cardiac events since then.  Anginal symptoms are usually manifested by left shoulder pain.  Other medical issues include medium size AAA followed annually by [facility], left bundle branch block, type 2 diabetes, GERD and arthritis.   He was hospitalized on April 21 at [facility] for acute systolic heart failure as well as A. fib with RVR.  He was noted to have an EF of 25%.  Volume overload was treated with IV furosemide.  He was started on anticoagulation with Eliquis.  There was mention of less than 3-second pauses while he was on digoxin which was discontinued.  He was subsequently seen on the 24th in our clinic and the dose of furosemide was increased.  The patient reports going to the emergency room at [facility] yesterday due to weakness.  He was told about dehydration and the dose of furosemide was decreased to 40 mg once daily.  He continues to be in A. fib with RVR today but surprisingly, he reports minimal symptoms at the present time.  His shortness of breath has improved with diuresis.  He  is not aware of palpitations.  No chest pain.  Past Medical History:  Diagnosis Date  . AAA (abdominal aortic aneurysm) without rupture ([place])   . Coronary artery disease    a. s/p CABG 1987;  b. s/p DES x 2 to SVG in 2005; c. s/p DES x 2 to SVG in 2007;  d. 2 DES placed to SVG and OM1 in 2010 ;  e. s/p PTCA mid body of SVG to OM1/OM2 due to ISR 08/13/12;  f. 10/2012 NSTEMI/Cath/PCI: native 3vd, VG->OM 99isr (3.0x16 Promus DES), LIMA->LAD nl, RIMA->RCA nl, EF 50%.  . Diabetes mellitus, type 2 ([place])   . Dyslipidemia (high LDL; low HDL)   . Gout   . Hyperlipidemia   . Hypertension   . Myositis   . Osteoarthritis   . Osteoporosis   . Paroxysmal supraventricular tachycardia [facility])     Past Surgical History:  Procedure Laterality Date  . CARDIAC SURGERY    . CORONARY STENT PLACEMENT    . EYE SURGERY Right March 2016   Cataract  . LEFT HEART CATHETERIZATION WITH CORONARY ANGIOGRAM N/A 08/13/2012   Procedure: LEFT HEART CATHETERIZATION WITH CORONARY ANGIOGRAM;  Surgeon: Burnell Blanks, MD;  Location: [facility] CATH LAB;  Service: Cardiovascular;  Laterality: N/A;  . LEFT HEART CATHETERIZATION WITH CORONARY/GRAFT ANGIOGRAM N/A 11/19/2012   Procedure: LEFT HEART CATHETERIZATION WITH [name];  Surgeon: [name], MD;  Location: [place] CATH LAB;  Service: Cardiovascular;  Laterality: N/A;  .  PROSTATE SURGERY       Current Outpatient Medications  Medication Sig Dispense Refill  . apixaban (ELIQUIS) 5 MG TABS tablet Take 5 mg by mouth 2 (two) times daily.    . carvedilol (COREG) 3.125 MG tablet Take 3.125 mg by mouth 2 (two) times daily with a meal.    . cyanocobalamin (,VITAMIN B-12,) 1000 MCG/ML injection Inject 1,000 mcg into the muscle every 30 (thirty) days.    . furosemide (LASIX) 40 MG tablet Take 80mg  (2 tablets) in the morning and 40mg  in the evening (Patient taking differently: 40 mg daily. Take 80mg  (2 tablets) in the morning and 40mg  in the evening) 90 tablet 3  .  glimepiride (AMARYL) 2 MG tablet Take 2 mg by mouth daily.     . isosorbide mononitrate (IMDUR) 30 MG 24 hr tablet Take 30 mg by mouth daily.    . Misc Natural Products (OSTEO BI-FLEX/5-LOXIN ADVANCED) TABS Take 2 tablets by mouth daily.    . nitroGLYCERIN (NITROSTAT) 0.4 MG SL tablet Place 1 tablet (0.4 mg total) under the tongue every 5 (five) minutes as needed. For chest pain 25 tablet 3  . Omega-3 Fatty Acids (FISH OIL PO) Take by mouth.    . pantoprazole (PROTONIX) 40 MG tablet Take 40 mg by mouth daily.    . potassium chloride (K-DUR) 10 MEQ tablet Take 20 meq in the morning and 39meq in the evening 90 tablet 3  . rosuvastatin (CRESTOR) 10 MG tablet Take 10 mg by mouth daily.    Marland Kitchen spironolactone (ALDACTONE) 25 MG tablet Take 25 mg by mouth daily.     No current facility-administered medications for this visit.     Allergies:   Lipitor [atorvastatin]; Metformin; Morphine and related; Niaspan [niacin er]; and Pravastatin sodium    Social History:  The patient  reports that he quit smoking about 32 years ago. His smoking use included cigarettes. He has never used smokeless tobacco. He reports that he does not drink alcohol or use drugs.   Family History:  The patient's family history includes Aneurysm in his sister; Hypertension in his father; Thyroid disease in his mother.    ROS:  Please see the history of present illness.   Otherwise, review of systems are positive for none.   All other systems are reviewed and negative.    PHYSICAL EXAM: VS:  BP 90/62 (BP Location: Left Arm, Patient Position: Sitting, Cuff Size: Normal)   Pulse (!) 134   Ht 6\' 1"  (1.854 m)   Wt 232 lb (105.2 kg)   BMI 30.61 kg/m  , BMI Body mass index is 30.61 kg/m. GEN: Well nourished, well developed, in no acute distress  HEENT: normal  Neck: no JVD, carotid bruits, or masses Cardiac: Irregularly irregular and tachycardic; no murmurs, rubs, or gallops,no edema  Respiratory:  clear to auscultation  bilaterally, normal work of breathing GI: soft, nontender, nondistended, + BS MS: no deformity or atrophy  Skin: warm and dry, no rash Neuro:  Strength and sensation are intact Psych: euthymic mood, full affect   EKG:  EKG is ordered today. The ekg ordered today demonstrates atrial fibrillation with rapid ventricular response.  Left bundle branch block.   Recent Labs: No results found for requested labs within last 8760 hours.    Lipid Panel    Component Value Date/Time   CHOL 108 08/13/2012 0735   TRIG 127 08/13/2012 0735   HDL 25 (L) 08/13/2012 0735   CHOLHDL 4.3 08/13/2012 0735  VLDL 25 08/13/2012 0735   LDLCALC 58 08/13/2012 0735      Wt Readings from Last 3 Encounters:  12/10/17 232 lb (105.2 kg)  11/20/17 246 lb (111.6 kg)  12/11/16 240 lb 12.8 oz (109.2 kg)        ASSESSMENT AND PLAN:  1.  Persistent atrial fibrillation with rapid ventricular response: Rate control is going to be difficult given that he has underlying sinus bradycardia when in sinus rhythm.  I recommend that we try maintaining sinus rhythm with an antiarrhythmic medication.  Given his degree of cardiomyopathy, I do not think he will hold his sinus rhythm without an antiarrhythmic medication.  Continue small dose carvedilol.  I elected to add amiodarone 200 mg twice daily. I recommend proceeding with cardioversion next week.  He has not missed any dose of Eliquis.  2.  Chronic systolic heart failure: Recent echocardiogram showed an EF of 20 to 25%.  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  The patient appears to be volume depleted today and I agree with decreasing the dose of furosemide to 40 mg daily.  Check labs today before cardioversion. Given that his blood pressure is low, I discontinued lisinopril for now at least until we get him back in sinus rhythm. We will need to repeat echocardiogram once he is in sinus rhythm.  3. Coronary artery disease involving  bypass graft without angina: Dual antiplatelet therapy were discontinued given the initiation of Eliquis.  4. Hyperlipidemia: Continue treatment with rosuvastatin with a target LDL of less than 70. He gets his labs done with his primary care physician.  5. Abdominal aortic aneurysm:  followed by [facility]. Stable in size.    Disposition:   FU with me in 1 month  Signed,  [name], MD  12/10/2017 9:11 AM    [place]

## 2017-12-10 NOTE — Progress Notes (Signed)
Cardiology Office Note   Date:  12/10/2017   ID:  Noah Mahoney, DOB January 30, 1935, MRN [id number]  PCP:  Noah Mahoney., MD  Cardiologist:   Noah Sacramento, MD   Chief Complaint  Patient presents with  . Follow-up    1 year.      History of Present Illness: Noah Mahoney is a 83 y.o. male who presents for a follow-up visit regarding coronary artery disease  And recently diagnosed atrial fibrillation with systolic heart failure. He is status post coronary artery bypass grafting in 1987 with multiple interventions on the vein graft to the obtuse marginal.  Most recent hospitalization in April of 2014 showed severe native three-vessel coronary artery disease with patent LIMA to LAD and RIMA to RCA. SVG to OM was patent but with 99% in-stent restenosis at the site of previous Cut[place] Balloon angioplasty. He underwent angioplasty and Promus drug-eluting stent placement without complications. No cardiac events since then.  Anginal symptoms are usually manifested by left shoulder pain.  Other medical issues include medium size AAA followed annually by [facility], left bundle branch block, type 2 diabetes, GERD and arthritis.   He was hospitalized on April 21 at [facility] for acute systolic heart failure as well as A. fib with RVR.  He was noted to have an EF of 25%.  Volume overload was treated with IV furosemide.  He was started on anticoagulation with Eliquis.  There was mention of less than 3-second pauses while he was on digoxin which was discontinued.  He was subsequently seen on the 24th in our clinic and the dose of furosemide was increased.  The patient reports going to the emergency room at [facility] yesterday due to weakness.  He was told about dehydration and the dose of furosemide was decreased to 40 mg once daily.  He continues to be in A. fib with RVR today but surprisingly, he reports minimal symptoms at the present time.  His shortness of breath has improved with diuresis.  He  is not aware of palpitations.  No chest pain.  Past Medical History:  Diagnosis Date  . AAA (abdominal aortic aneurysm) without rupture ([name])   . Coronary artery disease    a. s/p CABG 1987;  b. s/p DES x 2 to SVG in 2005; c. s/p DES x 2 to SVG in 2007;  d. 2 DES placed to SVG and OM1 in 2010 ;  e. s/p PTCA mid body of SVG to OM1/OM2 due to ISR 08/13/12;  f. 10/2012 NSTEMI/Cath/PCI: native 3vd, VG->OM 99isr (3.0x16 Promus DES), LIMA->LAD nl, RIMA->RCA nl, EF 50%.  . Diabetes mellitus, type 2 ([place])   . Dyslipidemia (high LDL; low HDL)   . Gout   . Hyperlipidemia   . Hypertension   . Myositis   . Osteoarthritis   . Osteoporosis   . Paroxysmal supraventricular tachycardia [facility])     Past Surgical History:  Procedure Laterality Date  . CARDIAC SURGERY    . CORONARY STENT PLACEMENT    . EYE SURGERY Right March 2016   Cataract  . LEFT HEART CATHETERIZATION WITH CORONARY ANGIOGRAM N/A 08/13/2012   Procedure: LEFT HEART CATHETERIZATION WITH CORONARY ANGIOGRAM;  Surgeon: Burnell Blanks, MD;  Location: [facility] CATH LAB;  Service: Cardiovascular;  Laterality: N/A;  . LEFT HEART CATHETERIZATION WITH CORONARY/GRAFT ANGIOGRAM N/A 11/19/2012   Procedure: LEFT HEART CATHETERIZATION WITH [name];  Surgeon: [name], MD;  Location: [place] CATH LAB;  Service: Cardiovascular;  Laterality: N/A;  .  PROSTATE SURGERY       Current Outpatient Medications  Medication Sig Dispense Refill  . apixaban (ELIQUIS) 5 MG TABS tablet Take 5 mg by mouth 2 (two) times daily.    . carvedilol (COREG) 3.125 MG tablet Take 3.125 mg by mouth 2 (two) times daily with a meal.    . cyanocobalamin (,VITAMIN B-12,) 1000 MCG/ML injection Inject 1,000 mcg into the muscle every 30 (thirty) days.    . furosemide (LASIX) 40 MG tablet Take 80mg  (2 tablets) in the morning and 40mg  in the evening (Patient taking differently: 40 mg daily. Take 80mg  (2 tablets) in the morning and 40mg  in the evening) 90 tablet 3  .  glimepiride (AMARYL) 2 MG tablet Take 2 mg by mouth daily.     . isosorbide mononitrate (IMDUR) 30 MG 24 hr tablet Take 30 mg by mouth daily.    . Misc Natural Products (OSTEO BI-FLEX/5-LOXIN ADVANCED) TABS Take 2 tablets by mouth daily.    . nitroGLYCERIN (NITROSTAT) 0.4 MG SL tablet Place 1 tablet (0.4 mg total) under the tongue every 5 (five) minutes as needed. For chest pain 25 tablet 3  . Omega-3 Fatty Acids (FISH OIL PO) Take by mouth.    . pantoprazole (PROTONIX) 40 MG tablet Take 40 mg by mouth daily.    . potassium chloride (K-DUR) 10 MEQ tablet Take 20 meq in the morning and 37meq in the evening 90 tablet 3  . rosuvastatin (CRESTOR) 10 MG tablet Take 10 mg by mouth daily.    Marland Kitchen spironolactone (ALDACTONE) 25 MG tablet Take 25 mg by mouth daily.     No current facility-administered medications for this visit.     Allergies:   Lipitor [atorvastatin]; Metformin; Morphine and related; Niaspan [niacin er]; and Pravastatin sodium    Social History:  The patient  reports that he quit smoking about 32 years ago. His smoking use included cigarettes. He has never used smokeless tobacco. He reports that he does not drink alcohol or use drugs.   Family History:  The patient's family history includes Aneurysm in his sister; Hypertension in his father; Thyroid disease in his mother.    ROS:  Please see the history of present illness.   Otherwise, review of systems are positive for none.   All other systems are reviewed and negative.    PHYSICAL EXAM: VS:  BP 90/62 (BP Location: Left Arm, Patient Position: Sitting, Cuff Size: Normal)   Pulse (!) 134   Ht 6\' 1"  (1.854 m)   Wt 232 lb (105.2 kg)   BMI 30.61 kg/m  , BMI Body mass index is 30.61 kg/m. GEN: Well nourished, well developed, in no acute distress  HEENT: normal  Neck: no JVD, carotid bruits, or masses Cardiac: Irregularly irregular and tachycardic; no murmurs, rubs, or gallops,no edema  Respiratory:  clear to auscultation  bilaterally, normal work of breathing GI: soft, nontender, nondistended, + BS MS: no deformity or atrophy  Skin: warm and dry, no rash Neuro:  Strength and sensation are intact Psych: euthymic mood, full affect   EKG:  EKG is ordered today. The ekg ordered today demonstrates atrial fibrillation with rapid ventricular response.  Left bundle branch block.   Recent Labs: No results found for requested labs within last 8760 hours.    Lipid Panel    Component Value Date/Time   CHOL 108 08/13/2012 0735   TRIG 127 08/13/2012 0735   HDL 25 (L) 08/13/2012 0735   CHOLHDL 4.3 08/13/2012 0735  VLDL 25 08/13/2012 0735   LDLCALC 58 08/13/2012 0735      Wt Readings from Last 3 Encounters:  12/10/17 232 lb (105.2 kg)  11/20/17 246 lb (111.6 kg)  12/11/16 240 lb 12.8 oz (109.2 kg)        ASSESSMENT AND PLAN:  1.  Persistent atrial fibrillation with rapid ventricular response: Rate control is going to be difficult given that he has underlying sinus bradycardia when in sinus rhythm.  I recommend that we try maintaining sinus rhythm with an antiarrhythmic medication.  Given his degree of cardiomyopathy, I do not think he will hold his sinus rhythm without an antiarrhythmic medication.  Continue small dose carvedilol.  I elected to add amiodarone 200 mg twice daily. I recommend proceeding with cardioversion next week.  He has not missed any dose of Eliquis.  2.  Chronic systolic heart failure: Recent echocardiogram showed an EF of 20 to 25%.  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  The patient appears to be volume depleted today and I agree with decreasing the dose of furosemide to 40 mg daily.  Check labs today before cardioversion. Given that his blood pressure is low, I discontinued lisinopril for now at least until we get him back in sinus rhythm. We will need to repeat echocardiogram once he is in sinus rhythm.  3. Coronary artery disease involving  bypass graft without angina: Dual antiplatelet therapy were discontinued given the initiation of Eliquis.  4. Hyperlipidemia: Continue treatment with rosuvastatin with a target LDL of less than 70. He gets his labs done with his primary care physician.  5. Abdominal aortic aneurysm:  followed by [facility]. Stable in size.    Disposition:   FU with me in 1 month  Signed,  Noah Sacramento, MD  12/10/2017 9:11 AM    [place]

## 2017-12-10 NOTE — Patient Instructions (Signed)
Medication Instructions:  STOP- Lisinopril START- Amiodarone 200 mg twice a day  If you need a refill on your cardiac medications before your next appointment, please call your pharmacy.  Labwork: Pre Op Labs HERE IN OUR OFFICE AT [facility]  Take the provided lab slips with you to the lab for your blood draw.   You will NOT need to fast   Testing/Procedures: Your physician has recommended that you have a Cardioversion (DCCV). Electrical Cardioversion uses a jolt of electricity to your heart either through paddles or wired patches attached to your chest. This is a controlled, usually prescheduled, procedure. Defibrillation is done under light anesthesia in the hospital, and you usually go home the day of the procedure. This is done to get your heart back into a normal rhythm. You are not awake for the procedure. Please see the instruction sheet given to you today.  Special Instructions: Dear Noah Mahoney  You are scheduled for a TEE/Cardioversion/TEE Cardioversion on            with Dr.                 Please arrive at the [facility] (Main Entrance A) at [facility]: [place], [place] [address] at            am/pm. (1 hour prior to procedure unless lab work is needed; if lab work is needed arrive 1.5 hours ahead)  DIET: Nothing to eat or drink after midnight except a sip of water with medications (see medication instructions below)  Medication Instructions: Hold Glimepiriide  Continue your anticoagulant: Eliquis You will need to continue your anticoagulant after your procedure until you  are told by your  Provider that it is safe to stop   Labs: If patient is on Coumadin, patient needs pt/INR, CBC, BMET within 3 days (No pt/INR needed for patients taking Xarelto, Eliquis, Pradaxa) For patients receiving anesthesia for TEE and all Cardioversion patients: BMET, CBC within 1 week   You must have a responsible person to drive you home and stay in the waiting area  during your procedure. Failure to do so could result in cancellation.  Bring your insurance cards.  *Special Note: Every effort is made to have your procedure done on time. Occasionally there are emergencies that occur at the hospital that may cause delays. Please be patient if a delay does occur.    Follow-Up: Your physician wants you to follow-up in: 2 weeks after Cardioversion.      Thank you for choosing [facility] : [facility]!!

## 2017-12-11 ENCOUNTER — Telehealth: Payer: Self-pay | Admitting: *Deleted

## 2017-12-11 MED ORDER — FUROSEMIDE 20 MG PO TABS: 20.0000 mg | ORAL_TABLET | Freq: Every day | ORAL | Status: DC

## 2017-12-11 NOTE — Telephone Encounter (Signed)
Patient given lab results and medication change. He will hold Furosemide for three days and then take 20 mg daily. He would like to know if he should hold the potassium for three days as well. He currently takes 10 meq daily.

## 2017-12-11 NOTE — Telephone Encounter (Signed)
He should take potassium only when he is taking furosemide.

## 2017-12-11 NOTE — Telephone Encounter (Signed)
Patient called and made aware of recommendations. He will call back with any further questions.

## 2017-12-11 NOTE — Telephone Encounter (Signed)
-----   Message from [name], MD sent at 12/11/2017  1:23 PM EDT ----- Inform patient that pre-cardioversion labs showed evidence of renal failure and volume depletion.  Hold furosemide for 3 days then resume at 20 mg daily.

## 2017-12-12 ENCOUNTER — Encounter: Payer: Self-pay | Admitting: Family

## 2017-12-12 ENCOUNTER — Ambulatory Visit
Admission: RE | Admit: 2017-12-12 | Discharge: 2017-12-12 | Disposition: A | Discharge status: Home / Self Care | Payer: Medicare Other | Source: Ambulatory Visit | Attending: Family | Admitting: Family

## 2017-12-12 ENCOUNTER — Other Ambulatory Visit: Payer: Self-pay

## 2017-12-12 ENCOUNTER — Ambulatory Visit (INDEPENDENT_AMBULATORY_CARE_PROVIDER_SITE_OTHER): Payer: Medicare Other | Admitting: Family

## 2017-12-12 VITALS — BP 87/65 | HR 122 | Temp 97.5°F | Resp 18 | Ht 73.0 in | Wt 232.0 lb

## 2017-12-12 DIAGNOSIS — I714 Abdominal aortic aneurysm, without rupture, unspecified: Secondary | ICD-10-CM

## 2017-12-12 DIAGNOSIS — N2889 Other specified disorders of kidney and ureter: Secondary | ICD-10-CM | POA: Diagnosis not present

## 2017-12-12 DIAGNOSIS — Z87891 Personal history of nicotine dependence: Secondary | ICD-10-CM | POA: Diagnosis not present

## 2017-12-12 DIAGNOSIS — I25810 Atherosclerosis of coronary artery bypass graft(s) without angina pectoris: Secondary | ICD-10-CM | POA: Diagnosis not present

## 2017-12-12 MED ORDER — IOPAMIDOL (ISOVUE-370) INJECTION 76%
60.0000 mL | Freq: Once | INTRAVENOUS | Status: AC | PRN
  Administered 2017-12-12: 60 mL via INTRAVENOUS

## 2017-12-12 NOTE — Patient Instructions (Signed)
Abdominal Aortic Aneurysm Blood pumps away from the heart through tubes (blood vessels) called arteries. Aneurysms are weak or damaged places in the wall of an artery. It bulges out like a balloon. An abdominal aortic aneurysm happens in the main artery of the body (aorta). It can burst or tear, causing bleeding inside the body. This is an emergency. It needs treatment right away. What are the causes? The exact cause is unknown. Things that could cause this problem include:  Fat and other substances building up in the lining of a tube.  Swelling of the walls of a blood vessel.  Certain tissue diseases.  Belly (abdominal) trauma.  An infection in the main artery of the body.  What increases the risk? There are things that make it more likely for you to have an aneurysm. These include:  Being over the age of 81 years old.  Having high blood pressure (hypertension).  Being a male.  Being white.  Being very overweight (obese).  Having a family history of aneurysm.  Using tobacco products.  What are the signs or symptoms? Symptoms depend on the size of the aneurysm and how fast it grows. There may not be symptoms. If symptoms occur, they can include:  Pain (belly, side, lower back, or groin).  Feeling full after eating a small amount of food.  Feeling sick to your stomach (nauseous), throwing up (vomiting), or both.  Feeling a lump in your belly that feels like it is beating (pulsating).  Feeling like you will pass out (faint).  How is this treated?  Medicine to control blood pressure and pain.  Imaging tests to see if the aneurysm gets bigger.  Surgery. How is this prevented? To lessen your chance of getting this condition:  Stop smoking. Stop chewing tobacco.  Limit or avoid alcohol.  Keep your blood pressure, blood sugar, and cholesterol within normal limits.  Eat less salt.  Eat foods low in saturated fats and cholesterol. These are found in animal and  whole dairy products.  Eat more fiber. Fiber is found in whole grains, vegetables, and fruits.  Keep a healthy weight.  Stay active and exercise often.  This information is not intended to replace advice given to you by your health care provider. Make sure you discuss any questions you have with your health care provider. Document Released: 11/10/2012 Document Revised: 12/22/2015 Document Reviewed: 08/15/2012 Elsevier Interactive Patient Education  2017 [employer]  

## 2017-12-12 NOTE — Progress Notes (Signed)
[facility] [place]   CC: Follow up Abdominal Aortic Aneurysm  History of Present Illness  Noah Mahoney is a 83 y.o. (August 16, 1934) male whom Dr. Kellie Simmering has been monitoring for abdominal aortic aneurysm.   He had a CTA abd/pelvis earlier today to evaluate his AAA; previous duplex of his abdomen have been limited due to overlying bowel gas.    Previous studies demonstrate an AAA, measuring 3.3 cm.   He does have chronic back discomfort with nerve compression causing radiation of pain down his left leg. He has had spinal injections in the past. He uses a cane to walk due to this pain.  He reports he has diverticulosis since about 2006 which causes chronic gas, denies abdominal pain.  Pt states he is under the care of a urologist in [facility] for kidney cancer, pt states his name is Dr. Delfino Lovett Puschinsky.  He has had a CABG in 1987 and at least one MI. He is scheduled for a cardioversion on 12-19-17 by Dr. Marlou Porch.   The patient denies history of stroke or TIA symptoms. He denies claudication type symptoms with walking.     Diabetic: Yes,started metformin in March 2016 Tobacco use: former smoker, quit in 1987  His medications include a daily statin, and Eliquis since the cardiac arrhythmia noted at [facility] in April 2019; he was hospitalized there for what sounds like exacerbation of CHF.  Pt states he is slightly more dyspneic since he was instructed to cut back his dose of Lasix. He denies feeling light headed.   Past Medical History:  Diagnosis Date  . AAA (abdominal aortic aneurysm) without rupture ([place])   . Coronary artery disease    a. s/p CABG 1987;  b. s/p DES x 2 to SVG in 2005; c. s/p DES x 2 to SVG in 2007;  d. 2 DES placed to SVG and OM1 in 2010 ;  e. s/p PTCA mid body of SVG to OM1/OM2 due to ISR 08/13/12;  f. 10/2012 NSTEMI/Cath/PCI: native 3vd, VG->OM 99isr (3.0x16 Promus DES), LIMA->LAD nl, RIMA->RCA nl, EF 50%.  . Diabetes  mellitus, type 2 ([place])   . Dyslipidemia (high LDL; low HDL)   . Gout   . Hyperlipidemia   . Hypertension   . Myositis   . Osteoarthritis   . Osteoporosis   . Paroxysmal supraventricular tachycardia [facility])    Past Surgical History:  Procedure Laterality Date  . CARDIAC SURGERY    . CORONARY STENT PLACEMENT    . EYE SURGERY Right March 2016   Cataract  . LEFT HEART CATHETERIZATION WITH CORONARY ANGIOGRAM N/A 08/13/2012   Procedure: LEFT HEART CATHETERIZATION WITH CORONARY ANGIOGRAM;  Surgeon: Burnell Blanks, MD;  Location: [facility] CATH LAB;  Service: Cardiovascular;  Laterality: N/A;  . LEFT HEART CATHETERIZATION WITH CORONARY/GRAFT ANGIOGRAM N/A 11/19/2012   Procedure: LEFT HEART CATHETERIZATION WITH [name];  Surgeon: [name], MD;  Location: [facility] CATH LAB;  Service: Cardiovascular;  Laterality: N/A;  . PROSTATE SURGERY     Social History Social History   Socioeconomic History  . Marital status: Married    Spouse name: Not on file  . Number of children: Not on file  . Years of education: Not on file  . Highest education level: Not on file  Occupational History  . Not on file  Social Needs  . Financial resource strain: Not on file  . Food insecurity:    Worry: Not on file    Inability: Not  on file  . Transportation needs:    Medical: Not on file    Non-medical: Not on file  Tobacco Use  . Smoking status: Former Smoker    Types: Cigarettes    Last attempt to quit: 10/21/1985    Years since quitting: 32.1  . Smokeless tobacco: Never Used  Substance and Sexual Activity  . Alcohol use: No  . Drug use: No  . Sexual activity: Not on file  Lifestyle  . Physical activity:    Days per week: Not on file    Minutes per session: Not on file  . Stress: Not on file  Relationships  . Social connections:    Talks on phone: Not on file    Gets together: Not on file    Attends religious service: Not on file    Active member of club or organization: Not  on file    Attends meetings of clubs or organizations: Not on file    Relationship status: Not on file  . Intimate partner violence:    Fear of current or ex partner: Not on file    Emotionally abused: Not on file    Physically abused: Not on file    Forced sexual activity: Not on file  Other Topics Concern  . Not on file  Social History Narrative  . Not on file   Family History Family History  Problem Relation Age of Onset  . Thyroid disease Mother   . Hypertension Father   . Aneurysm Sister     Current Outpatient Medications on File Prior to Visit  Medication Sig Dispense Refill  . amiodarone (PACERONE) 200 MG tablet Take 1 tablet (200 mg total) by mouth 2 (two) times daily. 60 tablet 0  . apixaban (ELIQUIS) 5 MG TABS tablet Take 1 tablet (5 mg total) by mouth 2 (two) times daily. 30 tablet 0  . carvedilol (COREG) 3.125 MG tablet Take 1 tablet (3.125 mg total) by mouth 2 (two) times daily with a meal. 60 tablet 0  . fluticasone (FLONASE) 50 MCG/ACT nasal spray Place 1 spray into both nostrils at bedtime.    . furosemide (LASIX) 20 MG tablet Take 1 tablet (20 mg total) by mouth daily.    Marland Kitchen glimepiride (AMARYL) 4 MG tablet Take 2 mg by mouth daily.     . isosorbide mononitrate (IMDUR) 30 MG 24 hr tablet Take 1 tablet (30 mg total) by mouth daily. 30 tablet 0  . loratadine (CLARITIN) 10 MG tablet Take 10 mg by mouth at bedtime.    . Misc Natural Products (OSTEO BI-FLEX/5-LOXIN ADVANCED) TABS Take 2 tablets by mouth daily.    . nitroGLYCERIN (NITROSTAT) 0.4 MG SL tablet Place 1 tablet (0.4 mg total) under the tongue every 5 (five) minutes as needed. For chest pain 25 tablet 3  . Omega-3 Fatty Acids (FISH OIL PO) Take 1 capsule by mouth daily.     . pantoprazole (PROTONIX) 40 MG tablet Take 40 mg by mouth daily.    . potassium chloride (K-DUR) 10 MEQ tablet Take 20 meq in the morning and 45meq in the evening (Patient taking differently: Take 10 mEq by mouth daily. ) 90 tablet 3  .  rosuvastatin (CRESTOR) 10 MG tablet Take 1 tablet (10 mg total) by mouth daily. 30 tablet 0  . spironolactone (ALDACTONE) 25 MG tablet Take 1 tablet (25 mg total) by mouth daily. 30 tablet 0   No current facility-administered medications on file prior to visit.    Allergies  Allergen Reactions  . Lipitor [Atorvastatin] Other (See Comments)    myalgias  . Metformin Other (See Comments)    unknown  . Morphine And Related Itching  . Pravastatin Sodium Other (See Comments)    myalgias  . Niaspan [Niacin Er] Rash    ROS: See HPI for pertinent positives and negatives.  Physical Examination  Vitals:   12/12/17 1420 12/12/17 1425  BP: 95/69 (!) 87/65  Pulse: (!) 122 (!) 122  Resp: 18   Temp: (!) 97.5 F (36.4 C)   TempSrc: Oral   SpO2: 96%   Weight: 232 lb (105.2 kg)   Height: 6\' 1"  (1.854 m)    Body mass index is 30.61 kg/m.  General: A&O x 3, WD, obese male. Gait: slow, steady, using cane HEENT: Grossly intact and WNL.  Pulmonary: Sym exp, respirations are non labored, fair air movt, CTAB, no rales, rhonchi, or wheezing. Cardiac: Iregular rhythm, tachycardic no detected murmur.  Carotid Bruits Right Left   Negative Negative   Adominal aortic pulse is not palpable Radial pulses are faintly palpable bilaterally                          VASCULAR EXAM:                                                                                                         LE Pulses Right Left       FEMORAL  not palpable, pt sitting in chair, was palpable prior  not palpable        POPLITEAL  not palpable   not palpable       POSTERIOR TIBIAL  not palpable   not palpable        DORSALIS PEDIS      ANTERIOR TIBIAL not palpable  not palpable     Gastrointestinal: soft, NTND, -G/R, - HSM, - masses palpated, - CVAT B. Large abdomen.  Musculoskeletal: M/S 5/5 in upper extremities, 4/5 in lower extremities, Extremities without ischemic changes.Wearing knee high compression hose, tace  bilateral pretibial pitting edema.  Skin: No rashes, no ulcers, no cellulitis.   Neurologic: CN 2-12 intact except hard of hearing, Pain and light touch intact in extremities are intact, Motor exam as listed above. Psychiatric: Normal thought content, mood appropriate to clinical situation.    DATA  AAA Duplex (12/12/2017):  Previous size: 4.14 cm (Date: 12-04-16); bilateral common iliac arteries not visualized. Decreased visualization of the abdominal vasculature due to overlying bowel gas and patient body habitus. Patent aorta and bilateral iliac system with difficulty visualizing the bilateral common iliac arteries    CTA abd/pelvis 12-12-17: VASCULAR  Aorta: Atherosclerotic changes of the lower thoracic and abdominal aorta. Greatest diameter of the descending thoracic aorta at the hiatus measures 3.1 cm. Greatest diameter of the juxtarenal abdominal aorta is 4.3 cm. This has increased from the most remote comparison of 2009, when diameter was estimated 3.6 cm. The aneurysm is at the level of the right renal artery and left renal artery.  No  dissection flap. No periaortic fluid. Greatest diameter of the distal aorta just above the bifurcation measures 2.7 cm.  Celiac: Atherosclerotic changes at the celiac artery origin with no significant stenosis. Branch vessels are patent.  SMA: No significant atherosclerotic changes at the superior mesenteric artery origin. Branch vessels are patent.  Renals: Bilateral renal arteries are patent with no significant stenosis at the origin.  IMA: IMA remains patent.  Right lower extremity:  Mild to moderate atherosclerotic changes of the right iliac system. Hypogastric artery is patent. Common femoral artery is patent with mild atherosclerotic changes. Proximal SFA and profunda femoris patent.  Left lower extremity:  Mild to moderate atherosclerotic changes of the left iliac system. No significant stenosis or occlusion.  Hypogastric artery is patent. No aneurysm. High bifurcation of the common femoral artery. Profunda femoris and proximal SFA patent.  Veins: Unremarkable appearance of the venous system.  Review of the MIP images confirms the above findings.  NON-VASCULAR  Lower chest: Calcified granuloma at the left lung base. Surgical changes of median sternotomy.  Hepatobiliary: Unremarkable liver parenchyma. Unremarkable gallbladder.  Pancreas: Unremarkable pancreas  Spleen: Coarse calcifications of splenic parenchyma, compatible with prior granulomatous disease  Adrenals/Urinary Tract: Unremarkable appearance of adrenal glands.  Right:  Enhancing renal mass at the posterior cortex measures 3.2 cm. No hydronephrosis. No nephrolithiasis. There are multiple hypodense liver lesions, compatible with simple cysts. The largest at the inferior pole measures 6.9 cm  Left:  No hydronephrosis. No nephrolithiasis. Vascular calcifications. Multiple low-density cystic structures of the left kidney compatible with Bosniak 1 cyst. Unremarkable course of the left ureter.  Unremarkable appearance of the urinary bladder .  Stomach/Bowel: Unremarkable appearance of the stomach. Duodenum diverticulum. Unremarkable appearance of small bowel. No evidence of obstruction. Colonic diverticular disease without evidence of acute diverticulitis. Normal appendix.  Lymphatic: No lymphadenopathy  Mesenteric: No free fluid or air. No adenopathy.  Reproductive: Transverse diameter of the prostate measures 4.5 cm  Other: Fat containing umbilical hernia. Fat containing bilateral inguinal hernia  Musculoskeletal: No acute displaced fracture. Degenerative changes of the thoracolumbar spine. No bony canal narrowing.  IMPRESSION: Juxtarenal abdominal aortic aneurysm, measuring 4.3 cm. Recommend followup by ultrasound in 1 year. This recommendation follows ACR consensus guidelines: White  Paper of the ACR Incidental Findings Committee II on Vascular Findings. J Am Coll Radiol 2013; 10:789-794. Aortic aneurysm NOS (ICD10-I71.9).  **An incidental finding of potential clinical significance has been found. Right-sided renal cell carcinoma measuring 3.3 cm. Referral for urologic surgery evaluation is recommended** .  These results will be called to the ordering clinician or representative by the Radiologist Assistant, and communication documented in the PACS or zVision Dashboard.  Aortic atherosclerosis.  Aortic Atherosclerosis (ICD10-I70.0).  Colonic diverticular disease without evidence of acute diverticulitis.  Signed,  Dulcy Fanny. Earleen Newport, DO  [facility]  [facility]   Electronically Signed   By: Corrie Mckusick D.O.   On: 12/12/2017 16:39   Medical Decision Making  The patient is a 83 y.o. male who presents with asymptomatic AAA with slight increase in size, to 4.3 cm today by CTA.  Dr. Oneida Alar viewed pt images for CTA abd/pevis earlier today, largest diameter of abdominal aorta is 4.5 cm x 4.1 cm, normal sized CIA's, normal sized renal, SMA, and celiac arteries with no significant stenosis.   Based on this patient's exam and diagnostic studies, the patient will follow up in 1 year  with the following studies: CTA abd/pelvis.  Consideration for repair of AAA  would be made when the size is 5.0 cm, growth > 1 cm/yr, and symptomatic status.        The patient was given information about AAA including signs, symptoms, treatment, and how to minimize the risk of enlargement and rupture of aneurysms.    I emphasized the importance of maximal medical management including strict control of blood pressure, blood glucose, and lipid levels, antiplatelet agents, obtaining regular exercise, and continued cessation of smoking.   The patient was advised to call 911 should the patient experience sudden onset abdominal or  back pain.   Thank you for allowing [place] to participate in this patient's care.  Clemon Chambers, RN, MSN, FNP-C [facility] [place] Office: [facility] Physician: Oneida Alar  12/12/2017, 2:55 PM

## 2017-12-16 ENCOUNTER — Telehealth: Payer: Self-pay | Admitting: Cardiovascular Disease

## 2017-12-16 NOTE — Telephone Encounter (Signed)
Returned the call to the patient. He has been informed to hold his diabetic medication the morning of the procedure. He verbalized his understanding.

## 2017-12-16 NOTE — Telephone Encounter (Signed)
New Message:       Pt is calling with questions about his procedure on 5/23

## 2017-12-17 ENCOUNTER — Ambulatory Visit: Payer: Medicare Other | Admitting: Cardiovascular Disease

## 2017-12-19 ENCOUNTER — Ambulatory Visit (HOSPITAL_COMMUNITY): Payer: Medicare Other | Admitting: Certified Registered"

## 2017-12-19 ENCOUNTER — Encounter (HOSPITAL_COMMUNITY)
Admission: RE | Disposition: A | Discharge status: Home / Self Care | Payer: Self-pay | Source: Ambulatory Visit | Attending: Cardiology

## 2017-12-19 ENCOUNTER — Encounter (HOSPITAL_COMMUNITY): Payer: Self-pay | Admitting: *Deleted

## 2017-12-19 ENCOUNTER — Ambulatory Visit (HOSPITAL_COMMUNITY)
Admission: RE | Admit: 2017-12-19 | Discharge: 2017-12-19 | Disposition: A | Discharge status: Home / Self Care | Payer: Medicare Other | Source: Ambulatory Visit | Attending: Cardiology | Admitting: Cardiology

## 2017-12-19 ENCOUNTER — Other Ambulatory Visit: Payer: Self-pay

## 2017-12-19 DIAGNOSIS — M109 Gout, unspecified: Secondary | ICD-10-CM | POA: Diagnosis not present

## 2017-12-19 DIAGNOSIS — I481 Persistent atrial fibrillation: Secondary | ICD-10-CM | POA: Diagnosis not present

## 2017-12-19 DIAGNOSIS — Z7901 Long term (current) use of anticoagulants: Secondary | ICD-10-CM | POA: Insufficient documentation

## 2017-12-19 DIAGNOSIS — Z8249 Family history of ischemic heart disease and other diseases of the circulatory system: Secondary | ICD-10-CM | POA: Insufficient documentation

## 2017-12-19 DIAGNOSIS — I25810 Atherosclerosis of coronary artery bypass graft(s) without angina pectoris: Secondary | ICD-10-CM | POA: Insufficient documentation

## 2017-12-19 DIAGNOSIS — I4891 Unspecified atrial fibrillation: Secondary | ICD-10-CM | POA: Diagnosis present

## 2017-12-19 DIAGNOSIS — I714 Abdominal aortic aneurysm, without rupture: Secondary | ICD-10-CM | POA: Insufficient documentation

## 2017-12-19 DIAGNOSIS — Z9889 Other specified postprocedural states: Secondary | ICD-10-CM | POA: Diagnosis not present

## 2017-12-19 DIAGNOSIS — Z888 Allergy status to other drugs, medicaments and biological substances status: Secondary | ICD-10-CM | POA: Insufficient documentation

## 2017-12-19 DIAGNOSIS — Z885 Allergy status to narcotic agent status: Secondary | ICD-10-CM | POA: Insufficient documentation

## 2017-12-19 DIAGNOSIS — M810 Age-related osteoporosis without current pathological fracture: Secondary | ICD-10-CM | POA: Insufficient documentation

## 2017-12-19 DIAGNOSIS — I5022 Chronic systolic (congestive) heart failure: Secondary | ICD-10-CM | POA: Diagnosis not present

## 2017-12-19 DIAGNOSIS — Z955 Presence of coronary angioplasty implant and graft: Secondary | ICD-10-CM | POA: Insufficient documentation

## 2017-12-19 DIAGNOSIS — I110 Hypertensive heart disease with heart failure: Secondary | ICD-10-CM | POA: Diagnosis not present

## 2017-12-19 DIAGNOSIS — E785 Hyperlipidemia, unspecified: Secondary | ICD-10-CM | POA: Insufficient documentation

## 2017-12-19 DIAGNOSIS — I4819 Other persistent atrial fibrillation: Secondary | ICD-10-CM

## 2017-12-19 DIAGNOSIS — Z87891 Personal history of nicotine dependence: Secondary | ICD-10-CM | POA: Diagnosis not present

## 2017-12-19 DIAGNOSIS — E119 Type 2 diabetes mellitus without complications: Secondary | ICD-10-CM | POA: Diagnosis not present

## 2017-12-19 DIAGNOSIS — Z79899 Other long term (current) drug therapy: Secondary | ICD-10-CM | POA: Diagnosis not present

## 2017-12-19 HISTORY — PX: CARDIOVERSION: SHX1299

## 2017-12-19 LAB — POCT I-STAT 4, (NA,K, GLUC, HGB,HCT)
GLUCOSE: 163 mg/dL — AB (ref 65–99)
HCT: 38 % — ABNORMAL LOW (ref 39.0–52.0)
Hemoglobin: 12.9 g/dL — ABNORMAL LOW (ref 13.0–17.0)
Potassium: 5 mmol/L (ref 3.5–5.1)
SODIUM: 137 mmol/L (ref 135–145)

## 2017-12-19 SURGERY — CARDIOVERSION
Anesthesia: General

## 2017-12-19 MED ORDER — SODIUM CHLORIDE 0.9 % IV SOLN
INTRAVENOUS | Status: DC | PRN
  Administered 2017-12-19: 500 mL via INTRAVENOUS

## 2017-12-19 MED ORDER — LIDOCAINE HCL (CARDIAC) PF 100 MG/5ML IV SOSY
PREFILLED_SYRINGE | INTRAVENOUS | Status: DC | PRN
  Administered 2017-12-19: 40 mg via INTRAVENOUS

## 2017-12-19 MED ORDER — PROPOFOL 10 MG/ML IV BOLUS
INTRAVENOUS | Status: DC | PRN
  Administered 2017-12-19: 60 mg via INTRAVENOUS

## 2017-12-19 NOTE — CV Procedure (Signed)
    Electrical Cardioversion Procedure Note Noah Mahoney [id number] [birth year]  Procedure: Electrical Cardioversion Indications:  Atrial Fibrillation  Time Out: Verified patient identification, verified procedure,medications/allergies/relevent history reviewed, required imaging and test results available.  Performed  Procedure Details  The patient was NPO after midnight. Anesthesia was administered at the beside  by Dr.Joslin with 60mg  of propofol.  Cardioversion was performed with synchronized biphasic defibrillation via AP pads with 120, 150, 200 joules.  3 attempt(s) were performed.  The patient converted to normal sinus rhythm. The patient tolerated the procedure well   IMPRESSION:  Successful cardioversion of atrial fibrillation after 3 attempts.     Candee Furbish 12/19/2017, 12:31 PM

## 2017-12-19 NOTE — Interval H&P Note (Signed)
History and Physical Interval Note:  12/19/2017 12:07 PM  Noah Mahoney  has presented today for surgery, with the diagnosis of AFIB  The various methods of treatment have been discussed with the patient and family. After consideration of risks, benefits and other options for treatment, the patient has consented to  Procedure(s): CARDIOVERSION (N/A) as a surgical intervention .  The patient's history has been reviewed, patient examined, no change in status, stable for surgery.  I have reviewed the patient's chart and labs.  Questions were answered to the patient's satisfaction.     [employer]

## 2017-12-19 NOTE — Anesthesia Postprocedure Evaluation (Signed)
Anesthesia Post Note  Patient: Noah Mahoney  Procedure(s) Performed: CARDIOVERSION (N/A )     Patient location during evaluation: Endoscopy Anesthesia Type: General Level of consciousness: awake and alert Pain management: pain level controlled Vital Signs Assessment: post-procedure vital signs reviewed and stable Respiratory status: spontaneous breathing, nonlabored ventilation, respiratory function stable and patient connected to nasal cannula oxygen Cardiovascular status: blood pressure returned to baseline and stable Postop Assessment: no apparent nausea or vomiting Anesthetic complications: no    Last Vitals:  Vitals:   12/19/17 1245 12/19/17 1300  BP: (!) 88/55 93/62  Pulse: (!) 57 66  Resp: 16 18  Temp:    SpO2: 100% 100%    Last Pain:  Vitals:   12/19/17 1245  TempSrc:   PainSc: 0-No pain                 [name] COKER

## 2017-12-19 NOTE — Transfer of Care (Signed)
Immediate Anesthesia Transfer of Care Note  Patient: Noah Mahoney  Procedure(s) Performed: CARDIOVERSION (N/A )  Patient Location: PACU  Anesthesia Type:General  Level of Consciousness: awake, alert , oriented and patient cooperative  Airway & Oxygen Therapy: Patient Spontanous Breathing and Patient connected to face mask oxygen  Post-op Assessment: Report given to RN, Post -op Vital signs reviewed and stable and Patient moving all extremities  Post vital signs: Reviewed and stable  Last Vitals:  Vitals Value Taken Time  BP    Temp    Pulse    Resp    SpO2      Last Pain:  Vitals:   12/19/17 1124  TempSrc: Oral  PainSc: 0-No pain         Complications: No apparent anesthesia complications

## 2017-12-19 NOTE — Discharge Instructions (Signed)
Electrical Cardioversion, Care After °This sheet gives you information about how to care for yourself after your procedure. Your health care provider may also give you more specific instructions. If you have problems or questions, contact your health care provider. °What can I expect after the procedure? °After the procedure, it is common to have: °· Some redness on the skin where the shocks were given. ° °Follow these instructions at home: °· Do not drive for 24 hours if you were given a medicine to help you relax (sedative). °· Take over-the-counter and prescription medicines only as told by your health care provider. °· Ask your health care provider how to check your pulse. Check it often. °· Rest for 48 hours after the procedure or as told by your health care provider. °· Avoid or limit your caffeine use as told by your health care provider. °Contact a health care provider if: °· You feel like your heart is beating too quickly or your pulse is not regular. °· You have a serious muscle cramp that does not go away. °Get help right away if: °· You have discomfort in your chest. °· You are dizzy or you feel faint. °· You have trouble breathing or you are short of breath. °· Your speech is slurred. °· You have trouble moving an arm or leg on one side of your body. °· Your fingers or toes turn cold or blue. °This information is not intended to replace advice given to you by your health care provider. Make sure you discuss any questions you have with your health care provider. °Document Released: 05/06/2013 Document Revised: 02/17/2016 Document Reviewed: 01/20/2016 °Elsevier Interactive Patient Education © 2018 Elsevier Inc. ° °

## 2017-12-19 NOTE — Anesthesia Preprocedure Evaluation (Signed)
Anesthesia Evaluation  Patient identified by MRN, date of birth, ID band Patient awake    Reviewed: Allergy & Precautions, NPO status , Patient's Chart, lab work & pertinent test results  Airway Mallampati: II  TM Distance: >3 FB Neck ROM: Full    Dental  (+) Edentulous Upper, Edentulous Lower   Pulmonary former smoker,    breath sounds clear to auscultation       Cardiovascular hypertension,  Rhythm:Irregular Rate:Normal     Neuro/Psych    GI/Hepatic   Endo/Other  diabetes  Renal/GU      Musculoskeletal   Abdominal   Peds  Hematology   Anesthesia Other Findings   Reproductive/Obstetrics                             Anesthesia Physical Anesthesia Plan  ASA: III  Anesthesia Plan: General   Post-op Pain Management:    Induction: Intravenous  PONV Risk Score and Plan:   Airway Management Planned: Mask  Additional Equipment:   Intra-op Plan:   Post-operative Plan:   Informed Consent: I have reviewed the patients History and Physical, chart, labs and discussed the procedure including the risks, benefits and alternatives for the proposed anesthesia with the patient or authorized representative who has indicated his/her understanding and acceptance.     Plan Discussed with: CRNA and Anesthesiologist  Anesthesia Plan Comments:         Anesthesia Quick Evaluation

## 2017-12-31 ENCOUNTER — Other Ambulatory Visit: Payer: Self-pay | Admitting: Cardiovascular Disease

## 2018-01-06 ENCOUNTER — Telehealth: Payer: Self-pay | Admitting: Cardiovascular Disease

## 2018-01-06 MED ORDER — AMIODARONE HCL 200 MG PO TABS: 200.0000 mg | ORAL_TABLET | Freq: Two times a day (BID) | ORAL | 1 refills | Status: DC

## 2018-01-06 NOTE — Telephone Encounter (Signed)
Rx(s) sent to pharmacy electronically.  

## 2018-01-06 NOTE — Telephone Encounter (Signed)
New Message ° ° °*STAT* If patient is at the pharmacy, call can be transferred to refill team. ° ° °1. Which medications need to be refilled? (please list name of each medication and dose if known) amiodarone (PACERONE) 200 MG tablet ° °2. Which pharmacy/location (including street and city if local pharmacy) is medication to be sent to? [facility] - [place], [place] - [address] AT Portal to Registered [facility] Sites ° °3. Do they need a 30 day or 90 day supply? 90 day  °

## 2018-05-26 ENCOUNTER — Telehealth: Payer: Self-pay | Admitting: Cardiovascular Disease

## 2018-05-26 NOTE — Telephone Encounter (Signed)
Returned call to patient he stated Eliquis price increased.Advised Eliquis does not have a generic.Stated he will continue taking.Advised samples left at front desk of [facility] office.

## 2018-05-26 NOTE — Telephone Encounter (Signed)
New message    Pt c/o medication issue:  1. Name of Medication:  eliquis   2. How are you currently taking this medication (dosage and times per day)? 2 x a day   3. Are you having a reaction (difficulty breathing--STAT)? no  4. What is your medication issue?  The price has gone up on this medication , he wants you to call him in generic to [facility] or something else

## 2018-07-22 ENCOUNTER — Telehealth: Payer: Self-pay | Admitting: Cardiovascular Disease

## 2018-07-22 MED ORDER — APIXABAN 5 MG PO TABS: 5.0000 mg | ORAL_TABLET | Freq: Two times a day (BID) | ORAL | 0 refills | Status: DC

## 2018-07-22 NOTE — Telephone Encounter (Signed)
°*  STAT* If patient is at the pharmacy, call can be transferred to refill team.   1. Which medications need to be refilled? (please list name of each medication and dose if known) New Prescription for Eliquis 2. Which pharmacy/location (including street and city if local pharmacy) is medication to be sent to? [facility] 3. Do they need a 30 day or 90 day supply? 180 and refills

## 2018-07-28 ENCOUNTER — Encounter: Payer: Self-pay | Admitting: Cardiovascular Disease

## 2018-07-28 NOTE — Telephone Encounter (Signed)
error 

## 2018-07-31 ENCOUNTER — Other Ambulatory Visit: Payer: Self-pay | Admitting: Cardiovascular Disease

## 2018-07-31 NOTE — Telephone Encounter (Signed)
Please review for refill, Thanks !  

## 2018-08-01 NOTE — Telephone Encounter (Signed)
Pt meets criteria for dose reduction based on most recent Scr>1.5 (this was 7 months ago) and age >56 would recommend repeat at upcoming visit to assess need to dose reduce. Will continue on current dose for now.

## 2018-08-25 NOTE — Progress Notes (Signed)
Cardiology Office Note   Date:  08/27/2018   ID:  Noah Mahoney, DOB 31-May-1935, MRN [id number]  PCP:  [name], MD  Cardiologist:   [name], MD   No chief complaint on file.     History of Present Illness: Noah Mahoney is a 84 y.o. male who presents for a follow-up visit regarding coronary artery disease ,persistent atrial fibrillation and systolic heart failure. He is status post coronary artery bypass grafting in 1987 with multiple interventions on the vein graft to the obtuse marginal.  Most recent hospitalization in April of 2014 showed severe native three-vessel coronary artery disease with patent LIMA to LAD and RIMA to RCA. SVG to OM was patent but with 99% in-stent restenosis at the site of previous [place] angioplasty. He underwent angioplasty and Promus drug-eluting stent placement without complications. No cardiac events since then.  Anginal symptoms are usually manifested by left shoulder pain.  Other medical issues include medium size AAA followed annually by [facility], left bundle branch block, type 2 diabetes, GERD and arthritis.   He was diagnosed with atrial fibrillation last year complicated by heart failure and a drop in ejection fraction to 25%.  He was loaded on amiodarone and ultimately underwent successful cardioversion to sinus rhythm.  He has been doing well since then with significant improvement in symptoms.  No recent chest or jaw pain.  He has stable exertional dyspnea.  He does complain of constipation since he started taking amiodarone.  His blood pressure is elevated today.  We did discontinue lisinopril last year given that his blood pressure was low in the setting of A. fib with RVR.  Past Medical History:  Diagnosis Date  . AAA (abdominal aortic aneurysm) without rupture ([place])   . Coronary artery disease    a. s/p CABG 1987;  b. s/p DES x 2 to SVG in 2005; c. s/p DES x 2 to SVG in 2007;  d. 2 DES placed to SVG and OM1 in 2010 ;   e. s/p PTCA mid body of SVG to OM1/OM2 due to ISR 08/13/12;  f. 10/2012 NSTEMI/Cath/PCI: native 3vd, VG->OM 99isr (3.0x16 Promus DES), LIMA->LAD nl, RIMA->RCA nl, EF 50%.  . Diabetes mellitus, type 2 ([place])   . Dyslipidemia (high LDL; low HDL)   . Gout   . Hyperlipidemia   . Hypertension   . Myositis   . Osteoarthritis   . Osteoporosis   . Paroxysmal supraventricular tachycardia [facility])     Past Surgical History:  Procedure Laterality Date  . CARDIAC SURGERY    . CARDIOVERSION N/A 12/19/2017   Procedure: CARDIOVERSION;  Surgeon: Jerline Pain, MD;  Location: [facility];  Service: Cardiovascular;  Laterality: N/A;  . CORONARY STENT PLACEMENT    . EYE SURGERY Right March 2016   Cataract  . LEFT HEART CATHETERIZATION WITH CORONARY ANGIOGRAM N/A 08/13/2012   Procedure: LEFT HEART CATHETERIZATION WITH CORONARY ANGIOGRAM;  Surgeon: Burnell Blanks, MD;  Location: [facility] CATH LAB;  Service: Cardiovascular;  Laterality: N/A;  . LEFT HEART CATHETERIZATION WITH CORONARY/GRAFT ANGIOGRAM N/A 11/19/2012   Procedure: LEFT HEART CATHETERIZATION WITH [name];  Surgeon: [name], MD;  Location: [facility] CATH LAB;  Service: Cardiovascular;  Laterality: N/A;  . PROSTATE SURGERY       Current Outpatient Medications  Medication Sig Dispense Refill  . amiodarone (PACERONE) 200 MG tablet Take 1 tablet (200 mg total) by mouth daily. 180 tablet 1  . carvedilol (COREG) 3.125 MG tablet  Take 1 tablet (3.125 mg total) by mouth 2 (two) times daily with a meal. 60 tablet 0  . ELIQUIS 5 MG TABS tablet TAKE 1 TABLET BY MOUTH TWICE A DAY 60 tablet 0  . furosemide (LASIX) 20 MG tablet Take 1 tablet (20 mg total) by mouth daily.    Marland Kitchen glimepiride (AMARYL) 4 MG tablet Take 2 mg by mouth daily.     . isosorbide mononitrate (IMDUR) 30 MG 24 hr tablet Take 1 tablet (30 mg total) by mouth daily. 30 tablet 0  . loratadine (CLARITIN) 10 MG tablet Take 10 mg by mouth at bedtime.    . Misc Natural Products  (OSTEO BI-FLEX/5-LOXIN ADVANCED) TABS Take 2 tablets by mouth daily.    . nitroGLYCERIN (NITROSTAT) 0.4 MG SL tablet Place 1 tablet (0.4 mg total) under the tongue every 5 (five) minutes as needed. For chest pain 25 tablet 3  . Omega-3 Fatty Acids (FISH OIL PO) Take 1 capsule by mouth daily.     . potassium chloride (K-DUR) 10 MEQ tablet Take 20 meq in the morning and 21meq in the evening (Patient taking differently: Take 10 mEq by mouth daily. ) 90 tablet 3  . rosuvastatin (CRESTOR) 10 MG tablet Take 1 tablet (10 mg total) by mouth daily. 30 tablet 0  . spironolactone (ALDACTONE) 25 MG tablet Take 1 tablet (25 mg total) by mouth daily. 30 tablet 0  . lisinopril (PRINIVIL,ZESTRIL) 5 MG tablet Take 1 tablet (5 mg total) by mouth daily. 90 tablet 1   No current facility-administered medications for this visit.     Allergies:   Lipitor [atorvastatin]; Metformin; Morphine and related; Pravastatin sodium; and Niaspan [niacin er]    Social History:  The patient  reports that he quit smoking about 32 years ago. His smoking use included cigarettes. He has never used smokeless tobacco. He reports that he does not drink alcohol or use drugs.   Family History:  The patient's family history includes Aneurysm in his sister; Hypertension in his father; Thyroid disease in his mother.    ROS:  Please see the history of present illness.   Otherwise, review of systems are positive for none.   All other systems are reviewed and negative.    PHYSICAL EXAM: VS:  BP (!) 162/70   Pulse (!) 51   Ht 6\' 1"  (1.854 m)   Wt 253 lb 12.8 oz (115.1 kg)   BMI 33.48 kg/m  , BMI Body mass index is 33.48 kg/m. GEN: Well nourished, well developed, in no acute distress  HEENT: normal  Neck: no JVD, carotid bruits, or masses Cardiac: Regular rate and rhythm and mildly bradycardic; no murmurs, rubs, or gallops,no edema  Respiratory:  clear to auscultation bilaterally, normal work of breathing GI: soft, nontender,  nondistended, + BS MS: no deformity or atrophy  Skin: warm and dry, no rash Neuro:  Strength and sensation are intact Psych: euthymic mood, full affect   EKG:  EKG is ordered today. The ekg ordered today demonstrates normal sinus rhythm with left bundle branch block.  Heart rate is 51 bpm.  Recent Labs: 12/10/2017: BUN 37; Creatinine, Ser 1.57; Platelets 247; TSH 1.170 12/19/2017: Hemoglobin 12.9; Potassium 5.0; Sodium 137    Lipid Panel    Component Value Date/Time   CHOL 108 08/13/2012 0735   TRIG 127 08/13/2012 0735   HDL 25 (L) 08/13/2012 0735   CHOLHDL 4.3 08/13/2012 0735   VLDL 25 08/13/2012 0735   LDLCALC 58 08/13/2012 0735  Wt Readings from Last 3 Encounters:  08/26/18 253 lb 12.8 oz (115.1 kg)  12/19/17 232 lb (105.2 kg)  12/12/17 232 lb (105.2 kg)        ASSESSMENT AND PLAN:  1.  Persistent atrial fibrillation: Maintaining in sinus rhythm with amiodarone after cardioversion last year.  I decreased amiodarone today to 200 mg once daily.  Continue long-term anticoagulation with Eliquis.   I reviewed his recent labs done by his primary care physician in December which overall were unremarkable including liver and thyroid function.  CBC showed stable hemoglobin around 12.5 and normal renal function.  2.  Chronic systolic heart failure: Ejection fraction was 20 to 25% last year in the setting of atrial fibrillation with RVR.  He appears to be euvolemic on small dose furosemide.  Continue small dose carvedilol which cannot be increased given his bradycardia. I elected to resume lisinopril 5 mg once daily which she was on before.  Continue spironolactone. I am going to repeat echocardiogram.  If ejection fraction is below 40%, I plan on switching him to [facility].  3. Coronary artery disease involving bypass graft without angina: No antiplatelet therapy at the present time given that he is on Eliquis.  4. Hyperlipidemia: Continue treatment with rosuvastatin.  Recent  LDL was 55.  5. Abdominal aortic aneurysm:  followed by [facility]. Stable in size.    Disposition:   FU with me in 6 months  Signed,  [name], MD  08/27/2018 4:56 PM    [place]

## 2018-08-26 ENCOUNTER — Encounter: Payer: Self-pay | Admitting: Cardiovascular Disease

## 2018-08-26 ENCOUNTER — Ambulatory Visit (INDEPENDENT_AMBULATORY_CARE_PROVIDER_SITE_OTHER): Payer: Medicare Other | Admitting: Cardiovascular Disease

## 2018-08-26 VITALS — BP 162/70 | HR 51 | Ht 73.0 in | Wt 253.8 lb

## 2018-08-26 DIAGNOSIS — I5022 Chronic systolic (congestive) heart failure: Secondary | ICD-10-CM

## 2018-08-26 DIAGNOSIS — I714 Abdominal aortic aneurysm, without rupture, unspecified: Secondary | ICD-10-CM

## 2018-08-26 DIAGNOSIS — I4819 Other persistent atrial fibrillation: Secondary | ICD-10-CM

## 2018-08-26 DIAGNOSIS — I2510 Atherosclerotic heart disease of native coronary artery without angina pectoris: Secondary | ICD-10-CM

## 2018-08-26 DIAGNOSIS — E785 Hyperlipidemia, unspecified: Secondary | ICD-10-CM | POA: Diagnosis not present

## 2018-08-26 MED ORDER — LISINOPRIL 5 MG PO TABS: 5.0000 mg | ORAL_TABLET | Freq: Every day | ORAL | 1 refills | Status: DC

## 2018-08-26 MED ORDER — AMIODARONE HCL 200 MG PO TABS: 200.0000 mg | ORAL_TABLET | Freq: Every day | ORAL | 1 refills | Status: DC

## 2018-08-26 NOTE — Patient Instructions (Signed)
Medication Instructions:  DECREASE the Amiodarone to 200 mg daily RESTART Lisinopril 5 mg tablet daily  If you need a refill on your cardiac medications before your next appointment, please call your pharmacy.   Lab work: None ordered   Testing/Procedures: Your physician has requested that you have an echocardiogram. Echocardiography is a painless test that uses sound waves to create images of your heart. It provides your doctor with information about the size and shape of your heart and how well your heart's chambers and valves are working. You may receive an ultrasound enhancing agent through an IV if needed to better visualize your heart during the echo.This procedure takes approximately one hour. There are no restrictions for this procedure.   Follow-Up: At [facility], you and your health needs are our priority.  As part of our continuing mission to provide you with exceptional heart care, we have created designated Provider Care Teams.  These Care Teams include your primary Cardiologist (physician) and Advanced Practice Providers (APPs -  Physician Assistants and Nurse Practitioners) who all work together to provide you with the care you need, when you need it. You will need a follow up appointment in 6 months.  Please call our office 2 months in advance to schedule this appointment.  You may see Dr. Fletcher Anon or one of the following Advanced Practice Providers on your designated Care Team:   Kerin Ransom, PA-C Roby Lofts, [place] . Sande Rives, PA-C

## 2018-08-27 ENCOUNTER — Other Ambulatory Visit: Payer: Self-pay | Admitting: Cardiovascular Disease

## 2018-08-27 NOTE — Telephone Encounter (Signed)
Please review for refill, Thanks !  

## 2018-08-27 NOTE — Telephone Encounter (Signed)
Please review for refill.  

## 2018-08-27 NOTE — Telephone Encounter (Signed)
Pt is an 65 yom with a wt of 115.1kg and a cr of 1.57 (12/10/17) last seen in the office by Dr. Fletcher Anon (08/26/17) pt requested 5mg  eliquis but only clears for 2.5mg  will consult pharmd for possible dose change

## 2018-08-28 NOTE — Telephone Encounter (Signed)
SCR = 1.29 IN December/2019 PER CARE EVERYWHERE

## 2018-09-21 ENCOUNTER — Other Ambulatory Visit: Payer: Self-pay | Admitting: Cardiovascular Disease

## 2018-09-22 NOTE — Telephone Encounter (Signed)
Please review for refill.  

## 2018-09-26 ENCOUNTER — Ambulatory Visit (HOSPITAL_COMMUNITY): Payer: Medicare Other | Attending: Cardiovascular Disease

## 2018-09-26 DIAGNOSIS — I5022 Chronic systolic (congestive) heart failure: Secondary | ICD-10-CM | POA: Diagnosis present

## 2018-09-26 MED ORDER — PERFLUTREN LIPID MICROSPHERE
1.0000 mL | INTRAVENOUS | Status: AC | PRN
  Administered 2018-09-26: 2 mL via INTRAVENOUS

## 2018-12-25 ENCOUNTER — Other Ambulatory Visit: Payer: Self-pay | Admitting: Cardiovascular Disease

## 2018-12-25 DIAGNOSIS — D689 Coagulation defect, unspecified: Secondary | ICD-10-CM

## 2018-12-25 DIAGNOSIS — Z01812 Encounter for preprocedural laboratory examination: Secondary | ICD-10-CM

## 2018-12-25 DIAGNOSIS — I4819 Other persistent atrial fibrillation: Secondary | ICD-10-CM

## 2018-12-25 DIAGNOSIS — R5383 Other fatigue: Secondary | ICD-10-CM

## 2018-12-29 ENCOUNTER — Telehealth: Payer: Self-pay

## 2018-12-29 ENCOUNTER — Other Ambulatory Visit: Payer: Self-pay | Admitting: Family

## 2018-12-29 DIAGNOSIS — I714 Abdominal aortic aneurysm, without rupture, unspecified: Secondary | ICD-10-CM

## 2018-12-29 MED ORDER — ROSUVASTATIN CALCIUM 10 MG PO TABS: 10.0000 mg | ORAL_TABLET | Freq: Every day | ORAL | 1 refills | Status: DC

## 2018-12-29 NOTE — Telephone Encounter (Signed)
Requested Prescriptions   Signed Prescriptions Disp Refills  . rosuvastatin (CRESTOR) 10 MG tablet 30 tablet 1    Sig: Take 1 tablet (10 mg total) by mouth daily.    Authorizing Provider: [name] A    Ordering User: Raelene Bott, BRANDY L

## 2019-01-18 ENCOUNTER — Other Ambulatory Visit: Payer: Self-pay | Admitting: Cardiovascular Disease

## 2019-01-18 DIAGNOSIS — I4819 Other persistent atrial fibrillation: Secondary | ICD-10-CM

## 2019-01-18 DIAGNOSIS — R5383 Other fatigue: Secondary | ICD-10-CM

## 2019-01-18 DIAGNOSIS — Z01812 Encounter for preprocedural laboratory examination: Secondary | ICD-10-CM

## 2019-01-18 DIAGNOSIS — D689 Coagulation defect, unspecified: Secondary | ICD-10-CM

## 2019-01-19 NOTE — Telephone Encounter (Signed)
Please review for refill. Thanks!  

## 2019-01-22 ENCOUNTER — Other Ambulatory Visit: Payer: Self-pay | Admitting: Cardiovascular Disease

## 2019-01-22 NOTE — Telephone Encounter (Signed)
Refill Request.  

## 2019-01-26 ENCOUNTER — Other Ambulatory Visit: Payer: Self-pay | Admitting: Cardiovascular Disease

## 2019-01-26 MED ORDER — FUROSEMIDE 20 MG PO TABS: 20.0000 mg | ORAL_TABLET | Freq: Every day | ORAL | 5 refills | Status: DC

## 2019-02-13 ENCOUNTER — Other Ambulatory Visit: Payer: Self-pay

## 2019-02-13 ENCOUNTER — Ambulatory Visit
Admission: RE | Admit: 2019-02-13 | Discharge: 2019-02-13 | Disposition: A | Discharge status: Home / Self Care | Payer: Medicare Other | Source: Ambulatory Visit | Attending: Family | Admitting: Family

## 2019-02-13 DIAGNOSIS — I714 Abdominal aortic aneurysm, without rupture, unspecified: Secondary | ICD-10-CM

## 2019-02-13 MED ORDER — IOPAMIDOL (ISOVUE-370) INJECTION 76%
60.0000 mL | Freq: Once | INTRAVENOUS | Status: AC | PRN
  Administered 2019-02-13: 12:00:00 60 mL via INTRAVENOUS

## 2019-02-18 ENCOUNTER — Ambulatory Visit: Payer: Medicare Other | Admitting: Family

## 2019-02-19 ENCOUNTER — Other Ambulatory Visit: Payer: Self-pay | Admitting: Cardiovascular Disease

## 2019-02-19 NOTE — Telephone Encounter (Signed)
Refill Request.  

## 2019-02-23 ENCOUNTER — Telehealth: Payer: Self-pay | Admitting: *Deleted

## 2019-02-23 NOTE — Telephone Encounter (Signed)
Virtual Visit Pre-Appointment Phone Call  "(Name), I am calling you today to discuss your upcoming appointment. We are currently trying to limit exposure to the virus that causes COVID-19 by seeing patients at home rather than in the office."  1. "What is the BEST phone number to call the day of the visit?" - include this in appointment notes  2. "Do you have or have access to (through a family member/friend) a smartphone with video capability that we can use for your visit?" a. If yes - list this number in appt notes as "cell" (if different from BEST phone #) and list the appointment type as a VIDEO visit in appointment notes b. If no - list the appointment type as a PHONE visit in appointment notes  Confirm consent - "In the setting of the current Covid19 crisis, you are scheduled for a (phone or video) visit with your provider on (date) at (time).  Just as we do with many in-office visits, in order for you to participate in this visit, we must obtain consent.  If you'd like, I can send this to your mychart (if signed up) or email for you to review.  Otherwise, I can obtain your verbal consent now.  All virtual visits are billed to your insurance company just like a normal visit would be.  By agreeing to a virtual visit, we'd like you to understand that the technology does not allow for your provider to perform an examination, and thus may limit your provider's ability to fully assess your condition. If your provider identifies any concerns that need to be evaluated in person, we will make arrangements to do so.  Finally, though the technology is pretty good, we cannot assure that it will always work on either your or our end, and in the setting of a video visit, we may have to convert it to a phone-only visit.  In either situation, we cannot ensure that we have a secure connection.  Are you willing to proceed?"YES 3. Advise patient to be prepared - "Two hours prior to your appointment, go ahead  and check your blood pressure, pulse, oxygen saturation, and your weight (if you have the equipment to check those) and write them all down. When your visit starts, your provider will ask you for this information. If you have an Apple Watch or Kardia device, please plan to have heart rate information ready on the day of your appointment. Please have a pen and paper handy nearby the day of the visit as well."  4. Give patient instructions for MyChart download to smartphone OR Doximity/Doxy.me as below if video visit (depending on what platform provider is using)  5. Inform patient they will receive a phone call 15 minutes prior to their appointment time (may be from unknown caller ID) so they should be prepared to answer    TELEPHONE CALL NOTE  Noah Mahoney has been deemed a candidate for a follow-up tele-health visit to limit community exposure during the Covid-19 pandemic. I spoke with the patient via phone to ensure availability of phone/video source, confirm preferred email & phone number, and discuss instructions and expectations.  I reminded Noah Mahoney to be prepared with any vital sign and/or heart rhythm information that could potentially be obtained via home monitoring, at the time of his visit. I reminded Noah Mahoney to expect a phone call prior to his visit.  Ricci Barker, RN 02/23/2019 1:43 PM   INSTRUCTIONS FOR DOWNLOADING THE  MYCHART APP TO SMARTPHONE  - The patient must first make sure to have activated MyChart and know their login information - If Apple, go to [employer] and type in MyChart in the search bar and download the app. If Android, ask patient to go to [employer] and type in [place] in the search bar and download the app. The app is free but as with any other app downloads, their phone may require them to verify saved payment information or Apple/Android password.  - The patient will need to then log into the app with their MyChart username and  password, and select [facility] as their healthcare provider to link the account. When it is time for your visit, go to the MyChart app, find appointments, and click Begin Video Visit. Be sure to Select Allow for your device to access the Microphone and Camera for your visit. You will then be connected, and your provider will be with you shortly.  **If they have any issues connecting, or need assistance please contact MyChart service desk [phone])**  **If using a computer, in order to ensure the best quality for their visit they will need to use either of the following Internet Browsers: [employer], or Google Chrome**  IF USING DOXIMITY or DOXY.ME - The patient will receive a link just prior to their visit by text.     FULL LENGTH CONSENT FOR TELE-HEALTH VISIT   I hereby voluntarily request, consent and authorize [facility] and its employed or contracted physicians, physician assistants, nurse practitioners or other licensed health care professionals (the Practitioner), to provide me with telemedicine health care services (the "Services") as deemed necessary by the treating Practitioner. I acknowledge and consent to receive the Services by the Practitioner via telemedicine. I understand that the telemedicine visit will involve communicating with the Practitioner through live audiovisual communication technology and the disclosure of certain medical information by electronic transmission. I acknowledge that I have been given the opportunity to request an in-person assessment or other available alternative prior to the telemedicine visit and am voluntarily participating in the telemedicine visit.  I understand that I have the right to withhold or withdraw my consent to the use of telemedicine in the course of my care at any time, without affecting my right to future care or treatment, and that the Practitioner or I may terminate the telemedicine visit at any time. I  understand that I have the right to inspect all information obtained and/or recorded in the course of the telemedicine visit and may receive copies of available information for a reasonable fee.  I understand that some of the potential risks of receiving the Services via telemedicine include:  Marland Kitchen Delay or interruption in medical evaluation due to technological equipment failure or disruption; . Information transmitted may not be sufficient (e.g. poor resolution of images) to allow for appropriate medical decision making by the Practitioner; and/or  . In rare instances, security protocols could fail, causing a breach of personal health information.  Furthermore, I acknowledge that it is my responsibility to provide information about my medical history, conditions and care that is complete and accurate to the best of my ability. I acknowledge that Practitioner's advice, recommendations, and/or decision may be based on factors not within their control, such as incomplete or inaccurate data provided by me or distortions of diagnostic images or specimens that may result from electronic transmissions. I understand that the practice of medicine is not an exact science and that Practitioner makes  no warranties or guarantees regarding treatment outcomes. I acknowledge that I will receive a copy of this consent concurrently upon execution via email to the email address I last provided but may also request a printed copy by calling the office of [facility].    I understand that my insurance will be billed for this visit.   I have read or had this consent read to me. . I understand the contents of this consent, which adequately explains the benefits and risks of the Services being provided via telemedicine.  . I have been provided ample opportunity to ask questions regarding this consent and the Services and have had my questions answered to my satisfaction. . I give my informed consent for the services to be  provided through the use of telemedicine in my medical care  By participating in this telemedicine visit I agree to the above.

## 2019-02-24 ENCOUNTER — Encounter: Payer: Self-pay | Admitting: Cardiovascular Disease

## 2019-02-24 ENCOUNTER — Telehealth (INDEPENDENT_AMBULATORY_CARE_PROVIDER_SITE_OTHER): Payer: Medicare Other | Admitting: Cardiovascular Disease

## 2019-02-24 ENCOUNTER — Telehealth: Payer: Self-pay | Admitting: *Deleted

## 2019-02-24 VITALS — BP 157/71 | HR 54 | Ht 73.0 in | Wt 245.0 lb

## 2019-02-24 DIAGNOSIS — I4819 Other persistent atrial fibrillation: Secondary | ICD-10-CM | POA: Diagnosis not present

## 2019-02-24 DIAGNOSIS — E785 Hyperlipidemia, unspecified: Secondary | ICD-10-CM

## 2019-02-24 DIAGNOSIS — I2510 Atherosclerotic heart disease of native coronary artery without angina pectoris: Secondary | ICD-10-CM

## 2019-02-24 DIAGNOSIS — I5022 Chronic systolic (congestive) heart failure: Secondary | ICD-10-CM

## 2019-02-24 MED ORDER — LISINOPRIL 10 MG PO TABS: 10.0000 mg | ORAL_TABLET | Freq: Every day | ORAL | 3 refills | Status: DC

## 2019-02-24 NOTE — Patient Instructions (Signed)
Medication Instructions:  INCREASE YOUR LISINOPRIL TO 10 MG DAILY   If you need a refill on your cardiac medications before your next appointment, please call your pharmacy.   Lab work: HAVE REQUESTED FROM YOUR PRIMARY CARE   Testing/Procedures: NONE  Follow-Up: At [facility], you and your health needs are our priority.  As part of our continuing mission to provide you with exceptional heart care, we have created designated Provider Care Teams.  These Care Teams include your primary Cardiologist (physician) and Advanced Practice Providers (APPs -  Physician Assistants and Nurse Practitioners) who all work together to provide you with the care you need, when you need it. You will need a follow up appointment in 6 months.  Please call our office 2 months in advance to schedule this appointment.  You may see [name], MD   or one of the following Advanced Practice Providers on your designated Care Team:   Kerin Ransom, PA-C Roby Lofts, [place] . Sande Rives, PA-C

## 2019-02-24 NOTE — Telephone Encounter (Signed)
Left message to call back to review AVS from today's visit with Dr Fletcher Anon

## 2019-02-24 NOTE — Telephone Encounter (Signed)
Left message to call back  

## 2019-02-24 NOTE — Progress Notes (Signed)
Virtual Visit via Telephone Note   This visit type was conducted due to national recommendations for restrictions regarding the COVID-19 Pandemic (e.g. social distancing) in an effort to limit this patient's exposure and mitigate transmission in our community.  Due to his co-morbid illnesses, this patient is at least at moderate risk for complications without adequate follow up.  This format is felt to be most appropriate for this patient at this time.  The patient did not have access to video technology/had technical difficulties with video requiring transitioning to audio format only (telephone).  All issues noted in this document were discussed and addressed.  No physical exam could be performed with this format.  Please refer to the patient's chart for his  consent to telehealth for [facility]   Date:  02/24/2019   ID:  Noah Mahoney, DOB [birth year], MRN [id number]  Patient Location: Home Provider Location: Office  PCP:  [name], MD  Cardiologist:  [name], MD  Electrophysiologist:  None   Evaluation Performed:  Follow-Up Visit  Chief Complaint: Doing well overall  History of Present Illness:    Noah Mahoney is a 84 y.o. male was reached via phone for a follow-up visit regarding coronary artery disease  persistent atrial fibrillation and chronic systolic heart failure. He is status post coronary artery bypass grafting in 1987 with multiple interventions on the vein graft to the obtuse marginal.  Most recent cardiac catheterization in April of 2014 showed severe native three-vessel coronary artery disease with patent LIMA to LAD and RIMA to RCA. SVG to OM was patent but with 99% in-stent restenosis at the site of previous [place] angioplasty. He underwent angioplasty and Promus drug-eluting stent placement.  Anginal symptoms are usually manifested by left shoulder pain.  Other medical issues include medium size AAA followed annually by [facility], left bundle  branch block, type 2 diabetes, GERD and arthritis.  He was hospitalized in 2019 for atrial fibrillation complicated by heart failure with a drop in ejection fraction to 25%.  He underwent successful cardioversion after loading with amiodarone. Repeat echocardiogram in February 2020 showed improvement in ejection fraction to 45 to 50%.  He has been doing reasonably well with no recent chest pain, shortness of breath or palpitations.  He takes his medications regularly.   The patient does not have symptoms concerning for COVID-19 infection (fever, chills, cough, or new shortness of breath).    Past Medical History:  Diagnosis Date  . AAA (abdominal aortic aneurysm) without rupture ([place])   . Coronary artery disease    a. s/p CABG 1987;  b. s/p DES x 2 to SVG in 2005; c. s/p DES x 2 to SVG in 2007;  d. 2 DES placed to SVG and OM1 in 2010 ;  e. s/p PTCA mid body of SVG to OM1/OM2 due to ISR 08/13/12;  f. 10/2012 NSTEMI/Cath/PCI: native 3vd, VG->OM 99isr (3.0x16 Promus DES), LIMA->LAD nl, RIMA->RCA nl, EF 50%.  . Diabetes mellitus, type 2 ([place])   . Dyslipidemia (high LDL; low HDL)   . Gout   . Hyperlipidemia   . Hypertension   . Myositis   . Osteoarthritis   . Osteoporosis   . Paroxysmal supraventricular tachycardia [facility])    Past Surgical History:  Procedure Laterality Date  . CARDIAC SURGERY    . CARDIOVERSION N/A 12/19/2017   Procedure: CARDIOVERSION;  Surgeon: Jerline Pain, MD;  Location: [place];  Service: Cardiovascular;  Laterality: N/A;  . CORONARY STENT PLACEMENT    .  EYE SURGERY Right March 2016   Cataract  . LEFT HEART CATHETERIZATION WITH CORONARY ANGIOGRAM N/A 08/13/2012   Procedure: LEFT HEART CATHETERIZATION WITH CORONARY ANGIOGRAM;  Surgeon: Burnell Blanks, MD;  Location: [facility] CATH LAB;  Service: Cardiovascular;  Laterality: N/A;  . LEFT HEART CATHETERIZATION WITH CORONARY/GRAFT ANGIOGRAM N/A 11/19/2012   Procedure: LEFT HEART CATHETERIZATION WITH [name];  Surgeon: [name], MD;  Location: [place] CATH LAB;  Service: Cardiovascular;  Laterality: N/A;  . PROSTATE SURGERY       Current Meds  Medication Sig  . amiodarone (PACERONE) 200 MG tablet Take 1 tablet (200 mg total) by mouth daily.  . carvedilol (COREG) 3.125 MG tablet Take 1 tablet (3.125 mg total) by mouth 2 (two) times daily with a meal.  . ELIQUIS 5 MG TABS tablet TAKE 1 TABLET BY MOUTH TWICE A DAY  . ferrous sulfate 325 (65 FE) MG tablet Take by mouth.  . furosemide (LASIX) 20 MG tablet Take 1 tablet (20 mg total) by mouth daily.  Marland Kitchen glimepiride (AMARYL) 4 MG tablet Take 2 mg by mouth daily.   . Glucosamine Sulfate 1500 MG PACK Take by mouth.  . isosorbide mononitrate (IMDUR) 30 MG 24 hr tablet Take 1 tablet (30 mg total) by mouth daily.  Marland Kitchen lisinopril (ZESTRIL) 5 MG tablet Take 1 tablet (5 mg total) by mouth daily. KEEP SCHEDULED APPOINTMENT  . loratadine (CLARITIN) 10 MG tablet Take 10 mg by mouth at bedtime.  . Misc Natural Products (OSTEO BI-FLEX/5-LOXIN ADVANCED) TABS Take 2 tablets by mouth daily.  . nitroGLYCERIN (NITROSTAT) 0.4 MG SL tablet Place 1 tablet (0.4 mg total) under the tongue every 5 (five) minutes as needed. For chest pain  . Omega-3 Fatty Acids (FISH OIL PO) Take 2 capsules by mouth daily.   . potassium chloride (K-DUR) 10 MEQ tablet Take 20 meq in the morning and 47meq in the evening (Patient taking differently: Take 10 mEq by mouth daily. )  . spironolactone (ALDACTONE) 25 MG tablet TAKE 1 TABLET BY MOUTH EVERY DAY     Allergies:   Lipitor [atorvastatin], Metformin, Morphine and related, Pravastatin sodium, and Niaspan [niacin er]   Social History   Tobacco Use  . Smoking status: Former Smoker    Types: Cigarettes    Quit date: 10/21/1985    Years since quitting: 33.3  . Smokeless tobacco: Never Used  Substance Use Topics  . Alcohol use: No  . Drug use: No     Family Hx: The patient's family history includes Aneurysm in his sister;  Hypertension in his father; Thyroid disease in his mother.  ROS:   Please see the history of present illness.     All other systems reviewed and are negative.   Prior CV studies:   The following studies were reviewed today:  I reviewed most recent echocardiogram with him which was done in February 2020  Labs/Other Tests and Data Reviewed:    EKG:  No ECG reviewed.  Recent Labs: No results found for requested labs within last 8760 hours.   Recent Lipid Panel Lab Results  Component Value Date/Time   CHOL 108 08/13/2012 07:35 AM   TRIG 127 08/13/2012 07:35 AM   HDL 25 (L) 08/13/2012 07:35 AM   CHOLHDL 4.3 08/13/2012 07:35 AM   LDLCALC 58 08/13/2012 07:35 AM    Wt Readings from Last 3 Encounters:  02/24/19 245 lb (111.1 kg)  08/26/18 253 lb 12.8 oz (115.1 kg)  12/19/17 232 lb (105.2 kg)  Objective:    Vital Signs:  BP (!) 157/71   Pulse (!) 54   Ht 6\' 1"  (1.854 m)   Wt 245 lb (111.1 kg)   BMI 32.32 kg/m    VITAL SIGNS:  reviewed  ASSESSMENT & PLAN:     1.  Persistent atrial fibrillation: He seems to be maintaining in sinus rhythm with amiodarone 200 mg once daily.  He is mildly bradycardic but overall is asymptomatic. He is tolerating anticoagulation with Eliquis.  I reviewed labs done in June which showed stable creatinine at 1.26.  He was mildly anemic at 11.1.  Continue same dose of Eliquis.  2.  Chronic systolic heart failure: Most recent ejection fraction was 45 to 50%.  Continue low-dose carvedilol which cannot be increased due to bradycardia.  I increased  lisinopril to 10 mg daily . continue spironolactone.   3. Coronary artery disease involving bypass graft without angina: No antiplatelet therapy at the present time given that he is on Eliquis.  4. Hyperlipidemia: Continue treatment with rosuvastatin.  Recent LDL was 55.  5. Abdominal aortic aneurysm:  followed by [facility]. Stable in size.     COVID-19 Education: The signs and symptoms of COVID-19  were discussed with the patient and how to seek care for testing (follow up with PCP or arrange E-visit).  The importance of social distancing was discussed today.  Time:   Today, I have spent 10 minutes with the patient with telehealth technology discussing the above problems.     Medication Adjustments/Labs and Tests Ordered: Current medicines are reviewed at length with the patient today.  Concerns regarding medicines are outlined above.   Tests Ordered: No orders of the defined types were placed in this encounter.   Medication Changes: No orders of the defined types were placed in this encounter.   Follow Up:  In Person in 6 month(s)  Signed, [name], MD  02/24/2019 11:24 AM    [place]

## 2019-02-24 NOTE — Telephone Encounter (Signed)
F/U Message          Patient is returning Melinda's call would like a call back.

## 2019-02-26 NOTE — Telephone Encounter (Signed)
Patient called back returning Melinda's call 

## 2019-02-26 NOTE — Telephone Encounter (Signed)
Reviewed AVS and medication changes with patient

## 2019-03-06 ENCOUNTER — Other Ambulatory Visit: Payer: Self-pay

## 2019-03-06 MED ORDER — POTASSIUM CHLORIDE ER 10 MEQ PO TBCR: EXTENDED_RELEASE_TABLET | ORAL | 3 refills | Status: DC

## 2019-03-11 MED ORDER — POTASSIUM CHLORIDE ER 10 MEQ PO TBCR: EXTENDED_RELEASE_TABLET | ORAL | 3 refills | Status: DC

## 2019-03-11 NOTE — Telephone Encounter (Signed)
Pt's pharmacy requested a 90 day supply. Resent Rx in for a 90 day supply. Confirmation received.

## 2019-03-11 NOTE — Addendum Note (Signed)
Addended by: Derl Barrow on: 03/11/2019 08:59 AM   Modules accepted: Orders

## 2019-04-07 ENCOUNTER — Telehealth: Payer: Self-pay | Admitting: Cardiovascular Disease

## 2019-04-07 NOTE — Telephone Encounter (Signed)
Pt c/o swelling: STAT is pt has developed SOB within 24 hours  1) How much weight have you gained and in what time span? no  2) If swelling, where is the swelling located? Both legs, left is more. States his left foot is leaking water.   3) Are you currently taking a fluid pill? yes  4) Are you currently SOB? no  5) Do you have a log of your daily weights (if so, list)? no  6) Have you gained 3 pounds in a day or 5 pounds in a week? no  7) Have you traveled recently? No

## 2019-04-07 NOTE — Telephone Encounter (Signed)
Returned call to pt he states "back in April he went to the urologist and was told he had a UTI" they gave him "some pills" and he started to take them and "2 days later" they called him and told him to stop taking them because they would interfere with his cardiac medications. So he stopped. But he states that ever since then he has been swelling bilat LE, the left is worse than the right. Now his 2nd toe on the left is weeping at the base of it. He states that he is wearing compression stockings and elevates his LE with 2 pillows at night and the swelling goes down in the AM. He states that his weight back in April was 245#. He states that he is urinating fine. Denies any Chest pain or pressure, SOB or any other sx. He will increase his lasix to 40mg  X3days he will take his weight daily and will CB if he develops any SOB of additional swelling/weeping.

## 2019-04-08 NOTE — Telephone Encounter (Signed)
Agree. He should also go see his PCP.

## 2019-04-08 NOTE — Telephone Encounter (Signed)
Called patient and he verbalized understanding to contact PCP.

## 2019-04-13 ENCOUNTER — Telehealth: Payer: Self-pay | Admitting: Cardiovascular Disease

## 2019-04-13 DIAGNOSIS — Z79899 Other long term (current) drug therapy: Secondary | ICD-10-CM

## 2019-04-13 NOTE — Telephone Encounter (Signed)
New Message    Patient states the nurse wanted him to call and report his weights which were:  Tues evening-255 Weds. Morning-254            Evening- 257 Thurs morning- 257            Evening- 255 Fri Morning- 254      Evening- 250

## 2019-04-13 NOTE — Telephone Encounter (Signed)
Returned call to pt he states that he has been taking lasix 40mg  since we spoke on 04-07-19. See weights below. He will continue to take his weight daily and he will continue to take lasix 40mg  (this is his choice)until appointment with PCP. I have faxed a lab over to his PCP @ [phone] so he can have a BMET done while at his appt Thursday. Called Dr Willette Pa office to notify of this.

## 2019-04-16 ENCOUNTER — Other Ambulatory Visit: Payer: Self-pay | Admitting: Cardiovascular Disease

## 2019-04-16 NOTE — Telephone Encounter (Signed)
Refill Request.  

## 2019-04-16 NOTE — Telephone Encounter (Signed)
Last OV 02/24/2019 Scr 1.26 per care everywhere on 01/15/2019 84 years old 111kg

## 2019-04-17 ENCOUNTER — Telehealth: Payer: Self-pay | Admitting: Cardiovascular Disease

## 2019-04-17 NOTE — Telephone Encounter (Signed)
New message:     Patient would like for some to call him. Please call patient.

## 2019-04-17 NOTE — Telephone Encounter (Signed)
We should bring him next week to be evaluated by an APP.

## 2019-04-17 NOTE — Telephone Encounter (Signed)
Patient was advised to call back after seeing Dr. Bea Graff (PCP). He saw his PCP yesterday. He had lab work yesterday. His weight is running around 250lbs except Thursday was 251lb. PCP started on him on antibiotic, gave him ointment for toes, was told to elevate legs. Dr. Willette Pa office is supposed to fax BMET results  This is in reference to 04/13/19 call   Will route to MD/RN to follow up on labs once received

## 2019-04-20 NOTE — Telephone Encounter (Signed)
Left a message for the patient to call back to make an appointment this week with an APP.

## 2019-04-20 NOTE — Telephone Encounter (Signed)
The patient has an appointment on Friday with Kerin Ransom, PA

## 2019-04-24 ENCOUNTER — Other Ambulatory Visit: Payer: Self-pay

## 2019-04-24 ENCOUNTER — Encounter: Payer: Self-pay | Admitting: Cardiology

## 2019-04-24 ENCOUNTER — Ambulatory Visit (INDEPENDENT_AMBULATORY_CARE_PROVIDER_SITE_OTHER): Payer: Medicare Other | Admitting: Cardiology

## 2019-04-24 VITALS — BP 124/54 | HR 49 | Temp 97.3°F | Ht 72.0 in | Wt 245.0 lb

## 2019-04-24 DIAGNOSIS — L03032 Cellulitis of left toe: Secondary | ICD-10-CM

## 2019-04-24 DIAGNOSIS — E1122 Type 2 diabetes mellitus with diabetic chronic kidney disease: Secondary | ICD-10-CM

## 2019-04-24 DIAGNOSIS — N183 Chronic kidney disease, stage 3 unspecified: Secondary | ICD-10-CM | POA: Insufficient documentation

## 2019-04-24 DIAGNOSIS — Z7901 Long term (current) use of anticoagulants: Secondary | ICD-10-CM | POA: Insufficient documentation

## 2019-04-24 DIAGNOSIS — I4819 Other persistent atrial fibrillation: Secondary | ICD-10-CM

## 2019-04-24 DIAGNOSIS — I714 Abdominal aortic aneurysm, without rupture, unspecified: Secondary | ICD-10-CM

## 2019-04-24 DIAGNOSIS — L0390 Cellulitis, unspecified: Secondary | ICD-10-CM | POA: Insufficient documentation

## 2019-04-24 DIAGNOSIS — C649 Malignant neoplasm of unspecified kidney, except renal pelvis: Secondary | ICD-10-CM

## 2019-04-24 DIAGNOSIS — Z951 Presence of aortocoronary bypass graft: Secondary | ICD-10-CM | POA: Diagnosis not present

## 2019-04-24 DIAGNOSIS — I2510 Atherosclerotic heart disease of native coronary artery without angina pectoris: Secondary | ICD-10-CM | POA: Diagnosis not present

## 2019-04-24 MED ORDER — FUROSEMIDE 20 MG PO TABS: ORAL_TABLET | ORAL | 4 refills | Status: DC

## 2019-04-24 NOTE — Progress Notes (Signed)
Cardiology Office Note:    Date:  04/24/2019   ID:  Noah Mahoney, DOB 1934-09-17, MRN [id number]  PCP:  [name], MD  Cardiologist:  [name], MD  Electrophysiologist:  None   Referring MD: [name], MD   Chief Complaint  Patient presents with  . Follow-up    History of Present Illness:    Noah Mahoney is a 84 y.o. male with a hx of of coronary disease, status post remote bypass grafting in 1987 with multiple interventions since.  His last intervention was a PCI to the SVG to OM in 2014.  His last ejection fraction by echo February 2020 was 45 to 50%.  In addition he has a history of PAF and is been maintained on sinus rhythm on amiodarone and Eliquis, he has a abdominal aortic aneurysm that is less than 4 cm, non-insulin-dependent diabetes followed by his PCP, chronic renal insufficiency stage III followed by his PCP, treated dyslipidemia, and renal cell cancer on CT scan followed at [facility].  Recently saw his primary care provider for swelling in both feet, left greater than right with a open wound on his left second toe.  His foot was puffy and erythematous.  There was concern of cellulitis.  His diuretics were adjusted up to 40 mg a day from 20 mg a day.  Dr. Bea Graff has been following this.  The patient tells me that he recently had lab work done that showed his kidney function was okay.  The patient cut back on his Lasix to 20 mg a day in the last 2 days on his own.  He tells me his foot is improving, it is still significantly swollen.  He denies any chest pain or shortness of breath.  He receives his care with Dr. Fletcher Anon here in [place], Dr. Toma Deiters with [facility], and a urologist at [facility].  He lives in [place].  Past Medical History:  Diagnosis Date  . AAA (abdominal aortic aneurysm) without rupture ([place])   . Coronary artery disease    a. s/p CABG 1987;  b. s/p DES x 2 to SVG in 2005; c. s/p DES x 2 to SVG in 2007;   d. 2 DES placed to SVG and OM1 in 2010 ;  e. s/p PTCA mid body of SVG to OM1/OM2 due to ISR 08/13/12;  f. 10/2012 NSTEMI/Cath/PCI: native 3vd, VG->OM 99isr (3.0x16 Promus DES), LIMA->LAD nl, RIMA->RCA nl, EF 50%.  . Diabetes mellitus, type 2 ([place])   . Dyslipidemia (high LDL; low HDL)   . Gout   . Hyperlipidemia   . Hypertension   . Myositis   . Osteoarthritis   . Osteoporosis   . Paroxysmal supraventricular tachycardia [facility])     Past Surgical History:  Procedure Laterality Date  . CARDIAC SURGERY    . CARDIOVERSION N/A 12/19/2017   Procedure: CARDIOVERSION;  Surgeon: Jerline Pain, MD;  Location: [place];  Service: Cardiovascular;  Laterality: N/A;  . CORONARY STENT PLACEMENT    . EYE SURGERY Right March 2016   Cataract  . LEFT HEART CATHETERIZATION WITH CORONARY ANGIOGRAM N/A 08/13/2012   Procedure: LEFT HEART CATHETERIZATION WITH CORONARY ANGIOGRAM;  Surgeon: Burnell Blanks, MD;  [facility] CATH LAB;  Service: Cardiovascular;  Laterality: N/A;  . LEFT HEART CATHETERIZATION WITH CORONARY/GRAFT ANGIOGRAM N/A 11/19/2012   Procedure: LEFT HEART CATHETERIZATION WITH [name];  Surgeon: [name], MD;  Location: [place] CATH LAB;  Service: Cardiovascular;  Laterality: N/A;  . PROSTATE SURGERY      Current Medications: Current Meds  Medication Sig  . amiodarone (PACERONE) 200 MG tablet Take 1 tablet (200 mg total) by mouth daily.  . carvedilol (COREG) 3.125 MG tablet Take 1 tablet (3.125 mg total) by mouth 2 (two) times daily with a meal.  . ELIQUIS 5 MG TABS tablet TAKE 1 TABLET BY MOUTH TWICE A DAY  . ferrous sulfate 325 (65 FE) MG tablet Take by mouth.  . furosemide (LASIX) 20 MG tablet Take 2 tabs on Mon/Wed/Fri and 1 tablet on Tue/Thurs/Sat/Sun  . glimepiride (AMARYL) 4 MG tablet Take 2 mg by mouth daily.   . Glucosamine Sulfate 1500 MG PACK Take by mouth.  . isosorbide mononitrate (IMDUR) 30 MG 24 hr tablet Take 1 tablet (30 mg total) by mouth daily.   Marland Kitchen lisinopril (ZESTRIL) 10 MG tablet Take 1 tablet (10 mg total) by mouth daily.  Marland Kitchen loratadine (CLARITIN) 10 MG tablet Take 10 mg by mouth at bedtime.  . Misc Natural Products (OSTEO BI-FLEX/5-LOXIN ADVANCED) TABS Take 2 tablets by mouth daily.  . nitroGLYCERIN (NITROSTAT) 0.4 MG SL tablet Place 1 tablet (0.4 mg total) under the tongue every 5 (five) minutes as needed. For chest pain  . Omega-3 Fatty Acids (FISH OIL PO) Take 2 capsules by mouth daily.   . potassium chloride (K-DUR) 10 MEQ tablet Take 20 meq in the morning and 15meq in the evening  . rosuvastatin (CRESTOR) 10 MG tablet Take 1 tablet (10 mg total) by mouth daily.  Marland Kitchen spironolactone (ALDACTONE) 25 MG tablet TAKE 1 TABLET BY MOUTH EVERY DAY  . [DISCONTINUED] furosemide (LASIX) 20 MG tablet Take 1 tablet (20 mg total) by mouth daily.     Allergies:   Lipitor [atorvastatin], Metformin, Morphine and related, Pravastatin sodium, and Niaspan [niacin er]   Social History   Socioeconomic History  . Marital status: Married    Spouse name: Not on file  . Number of children: Not on file  . Years of education: Not on file  . Highest education level: Not on file  Occupational History  . Not on file  Social Needs  . Financial resource strain: Not on file  . Food insecurity    Worry: Not on file    Inability: Not on file  . Transportation needs    Medical: Not on file    Non-medical: Not on file  Tobacco Use  . Smoking status: Former Smoker    Types: Cigarettes    Quit date: 10/21/1985    Years since quitting: 33.5  . Smokeless tobacco: Never Used  Substance and Sexual Activity  . Alcohol use: No  . Drug use: No  . Sexual activity: Not on file  Lifestyle  . Physical activity    Days per week: Not on file    Minutes per session: Not on file  . Stress: Not on file  Relationships  . Social Herbalist on phone: Not on file    Gets together: Not on file    Attends religious service: Not on file    Active member  of club or organization: Not on file    Attends meetings of clubs or organizations: Not on file    Relationship status: Not on file  Other Topics Concern  . Not on file  Social History Narrative  . Not on file     Family History: The patient's family history includes Aneurysm in his sister; Hypertension in  his father; Thyroid disease in his mother.  ROS:   Please see the history of present illness.     All other systems reviewed and are negative.  EKGs/Labs/Other Studies Reviewed:    The following studies were reviewed today: Echo Feb 2020  EKG:  EKG is ordered today.  The ekg ordered today demonstrates NSR- 50, LBBB- no change from Jan 2020  Recent Labs: No results found for requested labs within last 8760 hours.  Recent Lipid Panel    Component Value Date/Time   CHOL 108 08/13/2012 0735   TRIG 127 08/13/2012 0735   HDL 25 (L) 08/13/2012 0735   CHOLHDL 4.3 08/13/2012 0735   VLDL 25 08/13/2012 0735   LDLCALC 58 08/13/2012 0735    Physical Exam:    VS:  BP (!) 124/54 (BP Location: Left Arm, Patient Position: Sitting, Cuff Size: Normal)   Pulse (!) 49   Temp (!) 97.3 F (36.3 C)   Ht 6' (1.829 m)   Wt 245 lb (111.1 kg)   BMI 33.23 kg/m     Wt Readings from Last 3 Encounters:  04/24/19 245 lb (111.1 kg)  02/24/19 245 lb (111.1 kg)  08/26/18 253 lb 12.8 oz (115.1 kg)     GEN: Overweight, chronically ill appearing male presents in a wheel chair, in no acute distress HEENT: Normal NECK: No JVD; No carotid bruits LYMPHATICS: No lymphadenopathy CARDIAC: RRR, no murmurs, rubs, gallops RESPIRATORY:  Clear to auscultation without rales, wheezing or rhonchi  ABDOMEN: Soft, non-tender, non-distended MUSCULOSKELETAL:  LLE 1-2+ soft puffy edema in his foot. 1+ edema RLE SKIN: Warm and dry NEUROLOGIC:  Alert and oriented x 3 PSYCHIATRIC:  Normal affect   ASSESSMENT:    Cellulitis Lt foot and second toe. 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 I suggested the patient take Lasix 40 mg MWF and 20 mg other days.  He should have a BMP in 3 weeks with Dr Bea Graff.  Keep f/u with Dr Fletcher Anon as scheduled.    Medication Adjustments/Labs and Tests Ordered: Current medicines are reviewed at length with the patient today.  Concerns regarding medicines are outlined above.  Orders Placed This Encounter  Procedures  . EKG 12-Lead   Meds ordered this encounter  Medications  . furosemide (LASIX) 20 MG tablet    Sig: Take 2 tabs on Mon/Wed/Fri and 1 tablet on Tue/Thurs/Sat/Sun    Dispense:  45 tablet    Refill:  4    Patient Instructions  Medication Instructions:  INCREASE Lasix to 40mg  on Mondays, Wednesdays and Fridays and take 20mg  on Tuesdays, Thursdays and Saturdays  If you need a refill on your cardiac medications before your next appointment, please call your pharmacy.   Lab work: Your physician recommends that you have a BMET at Dr Willette Pa office  If you have labs (blood work) drawn today and your tests  are completely normal, you will receive your results only by: Marland Kitchen MyChart Message (if you have MyChart) OR . A paper copy in the mail If you have any lab test that is abnormal or we need to change your treatment, we will call you to review the results.  Testing/Procedures: None   Follow-Up: At [facility], you and your  health needs are our priority.  As part of our continuing mission to provide you with exceptional heart care, we have created designated Provider Care Teams.  These Care Teams include your primary Cardiologist (physician) and Advanced Practice Providers (APPs -  Physician Assistants and Nurse Practitioners) who all work together to provide you with the care you need, when you need it. You will need a follow up appointment in 4 months.  Please call our office 2 months in advance to schedule this appointment.  You may see [name], MD or one of the following Advanced Practice Providers on your designated Care Team:   Kerin Ransom, PA-C Roby Lofts, [place] . Sande Rives, PA-C  Any Other Special Instructions Will Be Listed Below (If Applicable).      Angelena Form, PA-C  04/24/2019 3:19 PM    [facility]

## 2019-04-24 NOTE — Assessment & Plan Note (Signed)
CHADS VASC= 6, he is on Eliquis 

## 2019-04-24 NOTE — Assessment & Plan Note (Signed)
Dr Bea Graff follows

## 2019-04-24 NOTE — Assessment & Plan Note (Signed)
NIDDM-followed by PCP 

## 2019-04-24 NOTE — Assessment & Plan Note (Signed)
70- multiple PCI's since  Last in 2014 with PCI to SVG-OM

## 2019-04-24 NOTE — Assessment & Plan Note (Signed)
Stable 3.8 cm by Ct July 2020 (actually smaller than previous study)

## 2019-04-24 NOTE — Patient Instructions (Signed)
Medication Instructions:  INCREASE Lasix to 40mg  on Mondays, Wednesdays and Fridays and take 20mg  on Tuesdays, Thursdays and Saturdays  If you need a refill on your cardiac medications before your next appointment, please call your pharmacy.   Lab work: Your physician recommends that you have a BMET at Dr Willette Pa office  If you have labs (blood work) drawn today and your tests are completely normal, you will receive your results only by: Marland Kitchen MyChart Message (if you have MyChart) OR . A paper copy in the mail If you have any lab test that is abnormal or we need to change your treatment, we will call you to review the results.  Testing/Procedures: None   Follow-Up: At [facility], you and your health needs are our priority.  As part of our continuing mission to provide you with exceptional heart care, we have created designated Provider Care Teams.  These Care Teams include your primary Cardiologist (physician) and Advanced Practice Providers (APPs -  Physician Assistants and Nurse Practitioners) who all work together to provide you with the care you need, when you need it. You will need a follow up appointment in 4 months.  Please call our office 2 months in advance to schedule this appointment.  You may see [name], MD or one of the following Advanced Practice Providers on your designated Care Team:   Kerin Ransom, PA-C Roby Lofts, [place] . Sande Rives, PA-C  Any Other Special Instructions Will Be Listed Below (If Applicable).

## 2019-04-24 NOTE — Assessment & Plan Note (Addendum)
Lt foot and second toe. Seen today for follow up- being managed by Dr Bea Graff and improving per patient

## 2019-04-24 NOTE — Assessment & Plan Note (Signed)
NSR on Amiodarone- 

## 2019-05-11 LAB — BASIC METABOLIC PANEL
BUN/Creatinine Ratio: 20 (ref 10–24)
BUN: 34 mg/dL — ABNORMAL HIGH (ref 8–27)
CO2: 23 mmol/L (ref 20–29)
Calcium: 9.6 mg/dL (ref 8.6–10.2)
Chloride: 105 mmol/L (ref 96–106)
Creatinine, Ser: 1.69 mg/dL — ABNORMAL HIGH (ref 0.76–1.27)
GFR calc Af Amer: 43 mL/min/{1.73_m2} — ABNORMAL LOW (ref >59)
GFR calc non Af Amer: 37 mL/min/{1.73_m2} — ABNORMAL LOW (ref >59)
Glucose: 183 mg/dL — ABNORMAL HIGH (ref 65–99)
Potassium: 5 mmol/L (ref 3.5–5.2)
Sodium: 139 mmol/L (ref 134–144)

## 2019-05-12 ENCOUNTER — Other Ambulatory Visit: Payer: Self-pay | Admitting: Cardiovascular Disease

## 2019-05-14 ENCOUNTER — Telehealth: Payer: Self-pay | Admitting: *Deleted

## 2019-05-14 MED ORDER — APIXABAN 2.5 MG PO TABS: 2.5000 mg | ORAL_TABLET | Freq: Two times a day (BID) | ORAL | 3 refills | Status: DC

## 2019-05-14 MED ORDER — FUROSEMIDE 20 MG PO TABS: 20.0000 mg | ORAL_TABLET | Freq: Every day | ORAL | 3 refills | Status: DC

## 2019-05-14 NOTE — Telephone Encounter (Addendum)
-----   Message from [name], MD sent at 05/14/2019 10:34 AM EDT ----- Renal function is slightly worse.  Recommend decreasing furosemide to 20 mg daily.  We should also decrease Eliquis to 2.5 mg twice daily given age above 73 and creatinine above 1.5.  Spoke with pt, he voiced understanding and repeated medication changes. New script sent to the pharmacy and Follow up scheduled per recall.

## 2019-06-21 ENCOUNTER — Other Ambulatory Visit: Payer: Self-pay | Admitting: Cardiovascular Disease

## 2019-06-22 NOTE — Telephone Encounter (Signed)
Please review for refill. Thank you! 

## 2019-07-14 ENCOUNTER — Other Ambulatory Visit: Payer: Self-pay | Admitting: Cardiovascular Disease

## 2019-07-14 DIAGNOSIS — Z01812 Encounter for preprocedural laboratory examination: Secondary | ICD-10-CM

## 2019-07-14 DIAGNOSIS — I4819 Other persistent atrial fibrillation: Secondary | ICD-10-CM

## 2019-07-14 DIAGNOSIS — R5383 Other fatigue: Secondary | ICD-10-CM

## 2019-07-14 DIAGNOSIS — D689 Coagulation defect, unspecified: Secondary | ICD-10-CM

## 2019-07-15 ENCOUNTER — Other Ambulatory Visit: Payer: Self-pay | Admitting: Cardiovascular Disease

## 2019-08-11 ENCOUNTER — Encounter: Payer: Self-pay | Admitting: Cardiovascular Disease

## 2019-08-11 ENCOUNTER — Other Ambulatory Visit: Payer: Self-pay

## 2019-08-11 ENCOUNTER — Ambulatory Visit (INDEPENDENT_AMBULATORY_CARE_PROVIDER_SITE_OTHER): Payer: Medicare Other | Admitting: Cardiovascular Disease

## 2019-08-11 VITALS — BP 109/64 | HR 48 | Temp 97.3°F | Ht 72.0 in | Wt 231.6 lb

## 2019-08-11 DIAGNOSIS — E785 Hyperlipidemia, unspecified: Secondary | ICD-10-CM

## 2019-08-11 DIAGNOSIS — I4819 Other persistent atrial fibrillation: Secondary | ICD-10-CM | POA: Diagnosis not present

## 2019-08-11 DIAGNOSIS — I2510 Atherosclerotic heart disease of native coronary artery without angina pectoris: Secondary | ICD-10-CM

## 2019-08-11 DIAGNOSIS — I5022 Chronic systolic (congestive) heart failure: Secondary | ICD-10-CM

## 2019-08-11 NOTE — Progress Notes (Signed)
Cardiology Office Note   Date:  08/11/2019   ID:  Noah Mahoney, DOB April 07, 1935, MRN [id number]  PCP:  [name], MD  Cardiologist:   [name], MD   No chief complaint on file.     History of Present Illness: Noah Mahoney is a 85 y.o. male who presents for a follow-up visit regarding coronary artery disease , persistent atrial fibrillation and chronic systolic heart failure. He is status post coronary artery bypass grafting in 1987 with multiple interventions on the vein graft to the obtuse marginal.  Most recent cardiac catheterization in April of 2014 showed severe native three-vessel coronary artery disease with patent LIMA to LAD and RIMA to RCA. SVG to OM was patent but with 99% in-stent restenosis at the site of previous [place] angioplasty. He underwent angioplasty and Promus drug-eluting stent placement.  Anginal symptoms are usually manifested by left shoulder pain.  Other medical issues include medium size AAA followed annually by [facility], left bundle branch block, type 2 diabetes, GERD and arthritis.  He was hospitalized in 2019 for atrial fibrillation complicated by heart failure with a drop in ejection fraction to 25%.  He underwent successful cardioversion after loading with amiodarone. Repeat echocardiogram in February 2020 showed improvement in ejection fraction to 45 to 50%.  He had significant swelling and redness in his legs in September and was treated for cellulitis.  He had a small ulceration at that time that has improved significantly and almost completely healed.  He has been doing reasonably well with no chest pain or worsening dyspnea.  No palpitations.  He has chronic bradycardia but denies dizziness.    Past Medical History:  Diagnosis Date  . AAA (abdominal aortic aneurysm) without rupture ([place])   . Coronary artery disease    a. s/p CABG 1987;  b. s/p DES x 2 to SVG in 2005; c. s/p DES x 2 to SVG in 2007;  d. 2 DES placed to SVG  and OM1 in 2010 ;  e. s/p PTCA mid body of SVG to OM1/OM2 due to ISR 08/13/12;  f. 10/2012 NSTEMI/Cath/PCI: native 3vd, VG->OM 99isr (3.0x16 Promus DES), LIMA->LAD nl, RIMA->RCA nl, EF 50%.  . Diabetes mellitus, type 2 ([place])   . Dyslipidemia (high LDL; low HDL)   . Gout   . Hyperlipidemia   . Hypertension   . Myositis   . Osteoarthritis   . Osteoporosis   . Paroxysmal supraventricular tachycardia [facility])     Past Surgical History:  Procedure Laterality Date  . CARDIAC SURGERY    . CARDIOVERSION N/A 12/19/2017   Procedure: CARDIOVERSION;  Surgeon: Jerline Pain, MD;  Location: [place];  Service: Cardiovascular;  Laterality: N/A;  . CORONARY STENT PLACEMENT    . EYE SURGERY Right March 2016   Cataract  . LEFT HEART CATHETERIZATION WITH CORONARY ANGIOGRAM N/A 08/13/2012   Procedure: LEFT HEART CATHETERIZATION WITH CORONARY ANGIOGRAM;  Surgeon: Burnell Blanks, MD;  Location: [facility] CATH LAB;  Service: Cardiovascular;  Laterality: N/A;  . LEFT HEART CATHETERIZATION WITH CORONARY/GRAFT ANGIOGRAM N/A 11/19/2012   Procedure: LEFT HEART CATHETERIZATION WITH [name];  Surgeon: [name], MD;  Location: [place] CATH LAB;  Service: Cardiovascular;  Laterality: N/A;  . PROSTATE SURGERY       Current Outpatient Medications  Medication Sig Dispense Refill  . amiodarone (PACERONE) 200 MG tablet Take 1 tablet (200 mg total) by mouth daily. 30 tablet 6  . apixaban (ELIQUIS) 2.5 MG TABS tablet  Take 1 tablet (2.5 mg total) by mouth 2 (two) times daily. 180 tablet 3  . carvedilol (COREG) 3.125 MG tablet Take 1 tablet (3.125 mg total) by mouth 2 (two) times daily with a meal. 60 tablet 0  . ferrous sulfate 325 (65 FE) MG tablet Take by mouth.    . furosemide (LASIX) 20 MG tablet Take 1 tablet (20 mg total) by mouth daily. 90 tablet 3  . glimepiride (AMARYL) 4 MG tablet Take 2 mg by mouth daily.     . Glucosamine Sulfate 1500 MG PACK Take by mouth.    . isosorbide mononitrate  (IMDUR) 30 MG 24 hr tablet Take 1 tablet (30 mg total) by mouth daily. 30 tablet 0  . lisinopril (ZESTRIL) 10 MG tablet Take 1 tablet (10 mg total) by mouth daily. 90 tablet 3  . loratadine (CLARITIN) 10 MG tablet Take 10 mg by mouth at bedtime.    . Misc Natural Products (OSTEO BI-FLEX/5-LOXIN ADVANCED) TABS Take 2 tablets by mouth daily.    . nitroGLYCERIN (NITROSTAT) 0.4 MG SL tablet Place 1 tablet (0.4 mg total) under the tongue every 5 (five) minutes as needed. For chest pain 25 tablet 3  . Omega-3 Fatty Acids (FISH OIL PO) Take 2 capsules by mouth daily.     . potassium chloride (K-DUR) 10 MEQ tablet Take 20 meq in the morning and 44meq in the evening 270 tablet 3  . rosuvastatin (CRESTOR) 10 MG tablet TAKE 1 TABLET BY MOUTH EVERY DAY 30 tablet 1  . spironolactone (ALDACTONE) 25 MG tablet TAKE 1 TABLET BY MOUTH EVERY DAY 90 tablet 0   No current facility-administered medications for this visit.    Allergies:   Lipitor [atorvastatin], Metformin, Morphine and related, Pravastatin sodium, and Niaspan [niacin er]    Social History:  The patient  reports that he quit smoking about 33 years ago. His smoking use included cigarettes. He has never used smokeless tobacco. He reports that he does not drink alcohol or use drugs.   Family History:  The patient's family history includes Aneurysm in his sister; Hypertension in his father; Thyroid disease in his mother.    ROS:  Please see the history of present illness.   Otherwise, review of systems are positive for none.   All other systems are reviewed and negative.    PHYSICAL EXAM: VS:  BP 109/64   Pulse (!) 48   Temp (!) 97.3 F (36.3 C)   Ht 6' (1.829 m)   Wt 231 lb 9.6 oz (105.1 kg)   SpO2 95%   BMI 31.41 kg/m  , BMI Body mass index is 31.41 kg/m. GEN: Well nourished, well developed, in no acute distress  HEENT: normal  Neck: no JVD, carotid bruits, or masses Cardiac: Regular rate and rhythm and mildly bradycardic; no murmurs,  rubs, or gallops,no edema  Respiratory:  clear to auscultation bilaterally, normal work of breathing GI: soft, nontender, nondistended, + BS MS: no deformity or atrophy  Skin: warm and dry, no rash Neuro:  Strength and sensation are intact Psych: euthymic mood, full affect   EKG:  EKG is ordered today. The ekg ordered today demonstrates sinus bradycardia with left bundle branch block.  Heart rate is 48 bpm.  Recent Labs: 05/11/2019: BUN 34; Creatinine, Ser 1.69; Potassium 5.0; Sodium 139    Lipid Panel    Component Value Date/Time   CHOL 108 08/13/2012 0735   TRIG 127 08/13/2012 0735   HDL 25 (L) 08/13/2012 [id number]  CHOLHDL 4.3 08/13/2012 0735   VLDL 25 08/13/2012 0735   LDLCALC 58 08/13/2012 0735      Wt Readings from Last 3 Encounters:  08/11/19 231 lb 9.6 oz (105.1 kg)  04/24/19 245 lb (111.1 kg)  02/24/19 245 lb (111.1 kg)        ASSESSMENT AND PLAN:  1.  Persistent atrial fibrillation: He is maintaining in sinus rhythm with amiodarone.  He has chronic bradycardia but denies any symptoms.  If bradycardia worsens, recommend stopping small dose carvedilol.  He is tolerating anticoagulation with low-dose Eliquis.  The dose was decreased to 2.5 mg since his creatinine is slightly above 1.5 and he is above 39 years old.    2.  Chronic systolic heart failure: Most recent ejection fraction was 45 to 50%.  Currently on small dose carvedilol, lisinopril and spironolactone.   3. Coronary artery disease involving bypass graft without angina: No antiplatelet therapy at the present time given that he is on Eliquis.  4. Hyperlipidemia: Continue treatment with rosuvastatin.  Recent LDL was 55.   Disposition:   FU with me in 6 months  Signed,  [name], MD  08/11/2019 1:38 PM    [place]

## 2019-08-11 NOTE — Patient Instructions (Signed)
Medication Instructions:  No changes *If you need a refill on your cardiac medications before your next appointment, please call your pharmacy*  Lab Work: None ordered If you have labs (blood work) drawn today and your tests are completely normal, you will receive your results only by: . MyChart Message (if you have MyChart) OR . A paper copy in the mail If you have any lab test that is abnormal or we need to change your treatment, we will call you to review the results.  Testing/Procedures: None ordered  Follow-Up: At [facility], you and your health needs are our priority.  As part of our continuing mission to provide you with exceptional heart care, we have created designated Provider Care Teams.  These Care Teams include your primary Cardiologist (physician) and Advanced Practice Providers (APPs -  Physician Assistants and Nurse Practitioners) who all work together to provide you with the care you need, when you need it.  Your next appointment:   6 month(s)  The format for your next appointment:   In Person  Provider:   Muhammad Arida, MD    

## 2019-08-12 ENCOUNTER — Other Ambulatory Visit: Payer: Self-pay | Admitting: Cardiovascular Disease

## 2019-08-12 NOTE — Telephone Encounter (Signed)
Please review for refill.  

## 2019-08-14 ENCOUNTER — Other Ambulatory Visit (INDEPENDENT_AMBULATORY_CARE_PROVIDER_SITE_OTHER): Payer: Medicare Other

## 2019-08-14 DIAGNOSIS — I4819 Other persistent atrial fibrillation: Secondary | ICD-10-CM | POA: Diagnosis not present

## 2019-09-19 DIAGNOSIS — L03116 Cellulitis of left lower limb: Secondary | ICD-10-CM | POA: Diagnosis not present

## 2019-09-19 DIAGNOSIS — I480 Paroxysmal atrial fibrillation: Secondary | ICD-10-CM | POA: Diagnosis not present

## 2019-09-19 DIAGNOSIS — R600 Localized edema: Secondary | ICD-10-CM

## 2019-09-19 DIAGNOSIS — E162 Hypoglycemia, unspecified: Secondary | ICD-10-CM | POA: Diagnosis not present

## 2019-09-19 DIAGNOSIS — E875 Hyperkalemia: Secondary | ICD-10-CM | POA: Diagnosis not present

## 2019-09-20 DIAGNOSIS — I480 Paroxysmal atrial fibrillation: Secondary | ICD-10-CM | POA: Diagnosis not present

## 2019-09-20 DIAGNOSIS — E162 Hypoglycemia, unspecified: Secondary | ICD-10-CM | POA: Diagnosis not present

## 2019-09-20 DIAGNOSIS — E875 Hyperkalemia: Secondary | ICD-10-CM | POA: Diagnosis not present

## 2019-09-20 DIAGNOSIS — L03116 Cellulitis of left lower limb: Secondary | ICD-10-CM | POA: Diagnosis not present

## 2019-09-21 DIAGNOSIS — E875 Hyperkalemia: Secondary | ICD-10-CM | POA: Diagnosis not present

## 2019-09-21 DIAGNOSIS — I480 Paroxysmal atrial fibrillation: Secondary | ICD-10-CM | POA: Diagnosis not present

## 2019-09-21 DIAGNOSIS — L03116 Cellulitis of left lower limb: Secondary | ICD-10-CM | POA: Diagnosis not present

## 2019-09-21 DIAGNOSIS — E162 Hypoglycemia, unspecified: Secondary | ICD-10-CM | POA: Diagnosis not present

## 2019-09-22 DIAGNOSIS — E162 Hypoglycemia, unspecified: Secondary | ICD-10-CM | POA: Diagnosis not present

## 2019-09-22 DIAGNOSIS — E875 Hyperkalemia: Secondary | ICD-10-CM | POA: Diagnosis not present

## 2019-09-22 DIAGNOSIS — I480 Paroxysmal atrial fibrillation: Secondary | ICD-10-CM | POA: Diagnosis not present

## 2019-09-22 DIAGNOSIS — L03116 Cellulitis of left lower limb: Secondary | ICD-10-CM | POA: Diagnosis not present

## 2019-09-23 DIAGNOSIS — E162 Hypoglycemia, unspecified: Secondary | ICD-10-CM | POA: Diagnosis not present

## 2019-09-23 DIAGNOSIS — E875 Hyperkalemia: Secondary | ICD-10-CM | POA: Diagnosis not present

## 2019-09-23 DIAGNOSIS — L03116 Cellulitis of left lower limb: Secondary | ICD-10-CM | POA: Diagnosis not present

## 2019-09-23 DIAGNOSIS — I480 Paroxysmal atrial fibrillation: Secondary | ICD-10-CM | POA: Diagnosis not present

## 2019-10-09 ENCOUNTER — Other Ambulatory Visit: Payer: Self-pay | Admitting: Cardiovascular Disease

## 2019-10-27 ENCOUNTER — Other Ambulatory Visit: Payer: Self-pay | Admitting: Cardiovascular Disease

## 2019-10-27 DIAGNOSIS — I4819 Other persistent atrial fibrillation: Secondary | ICD-10-CM

## 2019-10-27 DIAGNOSIS — R5383 Other fatigue: Secondary | ICD-10-CM

## 2019-10-27 DIAGNOSIS — Z01812 Encounter for preprocedural laboratory examination: Secondary | ICD-10-CM

## 2019-10-27 DIAGNOSIS — D689 Coagulation defect, unspecified: Secondary | ICD-10-CM

## 2019-10-28 DIAGNOSIS — E119 Type 2 diabetes mellitus without complications: Secondary | ICD-10-CM | POA: Diagnosis not present

## 2019-10-28 DIAGNOSIS — I4891 Unspecified atrial fibrillation: Secondary | ICD-10-CM | POA: Diagnosis not present

## 2019-10-28 DIAGNOSIS — L03116 Cellulitis of left lower limb: Secondary | ICD-10-CM | POA: Diagnosis not present

## 2019-10-29 DIAGNOSIS — E119 Type 2 diabetes mellitus without complications: Secondary | ICD-10-CM | POA: Diagnosis not present

## 2019-10-29 DIAGNOSIS — I4891 Unspecified atrial fibrillation: Secondary | ICD-10-CM | POA: Diagnosis not present

## 2019-10-29 DIAGNOSIS — L03116 Cellulitis of left lower limb: Secondary | ICD-10-CM | POA: Diagnosis not present

## 2019-10-30 DIAGNOSIS — E119 Type 2 diabetes mellitus without complications: Secondary | ICD-10-CM | POA: Diagnosis not present

## 2019-10-30 DIAGNOSIS — L03116 Cellulitis of left lower limb: Secondary | ICD-10-CM | POA: Diagnosis not present

## 2019-10-30 DIAGNOSIS — I4891 Unspecified atrial fibrillation: Secondary | ICD-10-CM | POA: Diagnosis not present

## 2020-01-03 ENCOUNTER — Other Ambulatory Visit: Payer: Self-pay | Admitting: Cardiovascular Disease

## 2020-01-04 NOTE — Telephone Encounter (Signed)
Rx request sent to pharmacy.  

## 2020-01-04 NOTE — Telephone Encounter (Signed)
Refill request

## 2020-01-18 ENCOUNTER — Other Ambulatory Visit: Payer: Self-pay | Admitting: Cardiovascular Disease

## 2020-01-18 DIAGNOSIS — D689 Coagulation defect, unspecified: Secondary | ICD-10-CM

## 2020-01-18 DIAGNOSIS — I4819 Other persistent atrial fibrillation: Secondary | ICD-10-CM

## 2020-01-18 DIAGNOSIS — R5383 Other fatigue: Secondary | ICD-10-CM

## 2020-01-18 DIAGNOSIS — Z01812 Encounter for preprocedural laboratory examination: Secondary | ICD-10-CM

## 2020-02-12 ENCOUNTER — Other Ambulatory Visit: Payer: Self-pay | Admitting: Cardiovascular Disease

## 2020-02-12 NOTE — Telephone Encounter (Signed)
Rx(s) sent to pharmacy electronically.  

## 2020-02-12 NOTE — Telephone Encounter (Signed)
Refill Request.  

## 2020-02-16 ENCOUNTER — Other Ambulatory Visit: Payer: Self-pay

## 2020-02-16 DIAGNOSIS — I872 Venous insufficiency (chronic) (peripheral): Secondary | ICD-10-CM

## 2020-02-25 ENCOUNTER — Ambulatory Visit (INDEPENDENT_AMBULATORY_CARE_PROVIDER_SITE_OTHER): Payer: Medicare Other | Admitting: Vascular Surgery

## 2020-02-25 ENCOUNTER — Ambulatory Visit (HOSPITAL_COMMUNITY)
Admission: RE | Admit: 2020-02-25 | Discharge: 2020-02-25 | Disposition: A | Discharge status: Home / Self Care | Payer: Medicare Other | Source: Ambulatory Visit | Attending: Vascular Surgery | Admitting: Vascular Surgery

## 2020-02-25 ENCOUNTER — Other Ambulatory Visit: Payer: Self-pay

## 2020-02-25 ENCOUNTER — Encounter: Payer: Self-pay | Admitting: Vascular Surgery

## 2020-02-25 ENCOUNTER — Encounter: Payer: Medicare Other | Admitting: Vascular Surgery

## 2020-02-25 VITALS — BP 116/58 | HR 50 | Temp 97.5°F | Ht 72.0 in | Wt 245.6 lb

## 2020-02-25 DIAGNOSIS — I872 Venous insufficiency (chronic) (peripheral): Secondary | ICD-10-CM

## 2020-02-25 DIAGNOSIS — I2510 Atherosclerotic heart disease of native coronary artery without angina pectoris: Secondary | ICD-10-CM

## 2020-02-25 DIAGNOSIS — I83813 Varicose veins of bilateral lower extremities with pain: Secondary | ICD-10-CM | POA: Diagnosis not present

## 2020-02-25 NOTE — Progress Notes (Signed)
Referring Physician: Dr. Meda Coffee  Patient name: Noah Mahoney MRN: [id number] DOB: 09-06-1934 Sex: male  REASON FOR CONSULT: Venous ulcers  HPI: Noah Mahoney is a 85 y.o. male, with history of exacerbation in remission of scattered ulcers on his lower extremities. He has worn some TED hose in the past but has never really been placed in full compression. There were some concerns for arterial occlusive disease and this was avoided. He has been seen at the [facility] wound center in the past. Currently most of his ulcerations are essentially healed. Patient has difficulty walking secondary to severe degenerative arthritis. He is able to ambulate with a cane. He has no prior history of DVT. The right greater saphenous was harvested for coronary bypass from the knee to the foot. Other medical problems include normal aortic aneurysm history 4 cm diameter recently scheduled for follow-up scan in 1 year, coronary artery disease, diabetes, hyperlipidemia, hypertension all of which have been stable. He is on Eliquis for atrial fibrillation.  Past Medical History:  Diagnosis Date  . AAA (abdominal aortic aneurysm) without rupture ([place])   . Coronary artery disease    a. s/p CABG 1987;  b. s/p DES x 2 to SVG in 2005; c. s/p DES x 2 to SVG in 2007;  d. 2 DES placed to SVG and OM1 in 2010 ;  e. s/p PTCA mid body of SVG to OM1/OM2 due to ISR 08/13/12;  f. 10/2012 NSTEMI/Cath/PCI: native 3vd, VG->OM 99isr (3.0x16 Promus DES), LIMA->LAD nl, RIMA->RCA nl, EF 50%.  . Diabetes mellitus, type 2 ([place])   . Dyslipidemia (high LDL; low HDL)   . Gout   . Hyperlipidemia   . Hypertension   . Myositis   . Osteoarthritis   . Osteoporosis   . Paroxysmal supraventricular tachycardia [facility])    Past Surgical History:  Procedure Laterality Date  . CARDIAC SURGERY    . CARDIOVERSION N/A 12/19/2017   Procedure: CARDIOVERSION;  Surgeon: Jerline Pain, MD;  Location: [place];  Service: Cardiovascular;   Laterality: N/A;  . CORONARY STENT PLACEMENT    . EYE SURGERY Right March 2016   Cataract  . LEFT HEART CATHETERIZATION WITH CORONARY ANGIOGRAM N/A 08/13/2012   Procedure: LEFT HEART CATHETERIZATION WITH CORONARY ANGIOGRAM;  Surgeon: Burnell Blanks, MD;  [facility] C[facility] LAB;  Service: Cardiovascular;  Laterality: N/A;  . LEFT HEART CATHETERIZATION WITH CORONARY/GRAFT ANGIOGRAM N/A 11/19/2012   Procedure: LEFT HEART CATHETERIZATION WITH [name];  Surgeon: [name], MD;  Location: [place] CATH LAB;  Service: Cardiovascular;  Laterality: N/A;  . PROSTATE SURGERY      Family History  Problem Relation Age of Onset  . Thyroid disease Mother   . Hypertension Father   . Aneurysm Sister     SOCIAL HISTORY: Social History   Socioeconomic History  . Marital status: Married    Spouse name: Not on file  . Number of children: Not on file  . Years of education: Not on file  . Highest education level: Not on file  Occupational History  . Not on file  Tobacco Use  . Smoking status: Former Smoker    Types: Cigarettes    Quit date: 10/21/1985    Years since quitting: 34.3  . Smokeless tobacco: Never Used  Substance and Sexual Activity  . Alcohol use: No  . Drug use: No  . Sexual activity: Not on file  Other Topics Concern  . Not on file  Social History Narrative  .  Not on file   Social Determinants of Health   Financial Resource Strain:   . Difficulty of Paying Living Expenses:   Food Insecurity:   . Worried About Charity fundraiser in the Last Year:   . Arboriculturist in the Last Year:   Transportation Needs:   . Film/video editor (Medical):   Marland Kitchen Lack of Transportation (Non-Medical):   Physical Activity:   . Days of Exercise per Week:   . Minutes of Exercise per Session:   Stress:   . Feeling of Stress :   Social Connections:   . Frequency of Communication with Friends and Family:   . Frequency of Social Gatherings with Friends and Family:    . Attends Religious Services:   . Active Member of Clubs or Organizations:   . Attends Archivist Meetings:   Marland Kitchen Marital Status:   Intimate Partner Violence:   . Fear of Current or Ex-Partner:   . Emotionally Abused:   Marland Kitchen Physically Abused:   . Sexually Abused:     Allergies  Allergen Reactions  . Lipitor [Atorvastatin] Other (See Comments)    myalgias  . Metformin Other (See Comments)    unknown  . Morphine And Related Itching  . Pravastatin Sodium Other (See Comments)    myalgias  . Niaspan [Niacin Er] Rash    Current Outpatient Medications  Medication Sig Dispense Refill  . apixaban (ELIQUIS) 2.5 MG TABS tablet Take 1 tablet (2.5 mg total) by mouth 2 (two) times daily. 180 tablet 3  . carvedilol (COREG) 3.125 MG tablet Take 1 tablet (3.125 mg total) by mouth 2 (two) times daily with a meal. 60 tablet 0  . furosemide (LASIX) 20 MG tablet Take 1 tablet (20 mg total) by mouth daily. 90 tablet 3  . isosorbide mononitrate (IMDUR) 30 MG 24 hr tablet Take 1 tablet (30 mg total) by mouth daily. 30 tablet 0  . lisinopril (ZESTRIL) 10 MG tablet Take 1 tablet (10 mg total) by mouth daily. 90 tablet 3  . loratadine (CLARITIN) 10 MG tablet Take 10 mg by mouth at bedtime.    . nitroGLYCERIN (NITROSTAT) 0.4 MG SL tablet Place 1 tablet (0.4 mg total) under the tongue every 5 (five) minutes as needed. For chest pain 25 tablet 3  . Omega-3 Fatty Acids (FISH OIL PO) Take 2 capsules by mouth daily.     . potassium chloride (K-DUR) 10 MEQ tablet Take 20 meq in the morning and 54meq in the evening 270 tablet 3  . rosuvastatin (CRESTOR) 10 MG tablet Take 1 tablet (10 mg total) by mouth daily. 90 tablet 1  . spironolactone (ALDACTONE) 25 MG tablet TAKE 1 TABLET BY MOUTH EVERY DAY 90 tablet 0  . amiodarone (PACERONE) 200 MG tablet TAKE 1 TABLET BY MOUTH EVERY DAY 90 tablet 1  . ferrous sulfate 325 (65 FE) MG tablet Take by mouth.    Marland Kitchen glimepiride (AMARYL) 4 MG tablet Take 2 mg by mouth  daily.     . Glucosamine Sulfate 1500 MG PACK Take by mouth.    . Misc Natural Products (OSTEO BI-FLEX/5-LOXIN ADVANCED) TABS Take 2 tablets by mouth daily.     No current facility-administered medications for this visit.    ROS:   General:  No weight loss, Fever, chills  HEENT: No recent headaches, no nasal bleeding, no visual changes, no sore throat  Neurologic: No dizziness, blackouts, seizures. No recent symptoms of stroke or mini- stroke. No recent  episodes of slurred speech, or temporary blindness.  Cardiac: No recent episodes of chest pain/pressure, no shortness of breath at rest.  No shortness of breath with exertion.  Denies history of atrial fibrillation or irregular heartbeat  Vascular: No history of rest pain in feet.  No history of claudication.  No history of non-healing ulcer, No history of DVT   Pulmonary: No home oxygen, no productive cough, no hemoptysis,  No asthma or wheezing  Musculoskeletal:  [X]  Arthritis, [ ]  Low back pain,  [X]  Joint pain  Hematologic:No history of hypercoagulable state.  No history of easy bleeding.  No history of anemia  Gastrointestinal: No hematochezia or melena,  No gastroesophageal reflux, no trouble swallowing  Urinary: [ ]  chronic Kidney disease, [ ]  on HD - [ ]  MWF or [ ]  TTHS, [ ]  Burning with urination, [ ]  Frequent urination, [ ]  Difficulty urinating;   Skin: No rashes  Psychological: No history of anxiety,  No history of depression   Physical Examination  Vitals:   02/25/20 1359  BP: (!) 116/58  Pulse: 50  Temp: (!) 97.5 F (36.4 C)  TempSrc: Skin  SpO2: 96%  Weight: (!) 245 lb 9.6 oz (111.4 kg)  Height: 6' (1.829 m)    Body mass index is 33.31 kg/m.  General:  Alert and oriented, no acute distress HEENT: Normal Neck: No bruit or JVD Pulmonary: Clear to auscultation bilaterally Cardiac: Regular Rate and Rhythm  Abdomen: Soft, non-tender, non-distended, no mass, no scars Skin: No rash, scattered less than 1  mm diameter skin erosions no large ulcer Extremity Pulses:  2+ femoral, dorsalis pedis bilaterally Musculoskeletal: No deformity diffuse 2+ edema knee into the foot bilaterally  Neurologic: Upper and lower extremity motor 5/5 and symmetric  DATA:  Patient had a venous reflux exam today. This showed no evidence of DVT. He had deep vein reflux bilaterally. He had reflux in the proximal half of the right greater saphenous vein with a diameter of 4 to 7 mm. Left side was 5 to 7 mm. I reviewed and interpreted the study.  I reviewed the patient's recent CT angiogram dated Dec 15, 2019. This showed a 4 cm abdominal aortic aneurysm scattered plaque in the lower extremities with some evidence of tibial disease left leg had fairly similar findings.  ASSESSMENT: Patient with evidence of deep and superficial venous reflux. He has had scattered ulcers on his legs in the past. Currently these are essentially healed. He has palpable dorsalis pedis pulse bilaterally. I do not believe he really has an element of arterial occlusive disease although CT angiogram previously done shows he has scattered areas of atherosclerosis.   PLAN: Patient was measured and fitted and given a prescription today for lower extremity compression stockings. He will see if he can be compliant with these for 3 months and see if he gets improvement in symptoms. If his symptoms are not improved we could consider laser ablation to improve his overall symptoms. The vein on the right side was at the limits of treatable. The left side vein may be treatable with the laser.  Patient will follow up with me in 3 months time to see if he has had any improvement in symptoms.  Of note he has a known abdominal aortic aneurysm approximately 4 cm diameter and needs a follow-up scan of this in 1 year.   Ruta Hinds, MD [facility] Office: [phone]

## 2020-02-26 ENCOUNTER — Other Ambulatory Visit: Payer: Self-pay

## 2020-02-26 DIAGNOSIS — I714 Abdominal aortic aneurysm, without rupture, unspecified: Secondary | ICD-10-CM

## 2020-03-01 ENCOUNTER — Other Ambulatory Visit: Payer: Self-pay

## 2020-03-01 ENCOUNTER — Ambulatory Visit (INDEPENDENT_AMBULATORY_CARE_PROVIDER_SITE_OTHER): Payer: Medicare Other | Admitting: Cardiovascular Disease

## 2020-03-01 ENCOUNTER — Encounter: Payer: Self-pay | Admitting: Cardiovascular Disease

## 2020-03-01 VITALS — BP 128/60 | HR 50 | Ht 73.0 in | Wt 247.2 lb

## 2020-03-01 DIAGNOSIS — I4819 Other persistent atrial fibrillation: Secondary | ICD-10-CM | POA: Diagnosis not present

## 2020-03-01 DIAGNOSIS — E785 Hyperlipidemia, unspecified: Secondary | ICD-10-CM

## 2020-03-01 DIAGNOSIS — I2510 Atherosclerotic heart disease of native coronary artery without angina pectoris: Secondary | ICD-10-CM

## 2020-03-01 DIAGNOSIS — I5022 Chronic systolic (congestive) heart failure: Secondary | ICD-10-CM

## 2020-03-01 NOTE — Patient Instructions (Signed)

## 2020-03-01 NOTE — Progress Notes (Signed)
Cardiology Office Note   Date:  03/01/2020   ID:  Noah Mahoney, DOB 08-16-1934, MRN [id number]  PCP:  [name], MD  Cardiologist:   [name], MD   No chief complaint on file.     History of Present Illness: Noah Mahoney is a 85 y.o. male who presents for a follow-up visit regarding coronary artery disease , persistent atrial fibrillation and chronic systolic heart failure. He is status post coronary artery bypass grafting in 1987 with multiple interventions on the vein graft to the obtuse marginal.  Most recent cardiac catheterization in April of 2014 showed severe native three-vessel coronary artery disease with patent LIMA to LAD and RIMA to RCA. SVG to OM was patent but with 99% in-stent restenosis at the site of previous Cutting Balloon angioplasty. He underwent angioplasty and Promus drug-eluting stent placement.  Anginal symptoms are usually manifested by left shoulder pain.  Other medical issues include medium size AAA followed annually by [facility], left bundle branch block, type 2 diabetes, GERD and arthritis.  He was hospitalized in 2019 for atrial fibrillation complicated by heart failure with a drop in ejection fraction to 25%.  He underwent successful cardioversion after loading with amiodarone. Repeat echocardiogram in February 2020 showed improvement in ejection fraction to 45 to 50%.  He has chronic venous insufficiency with recurrent ulceration.  He was seen by Dr. Oneida Alar recently.  He is wearing support stockings with plans for laser ablation if there is no improvement.  He has been stable from a cardiac standpoint with no recent chest pain or worsening dyspnea.  He continues to grow a vegetable garden. He is chronically bradycardic but has no symptoms.   Past Medical History:  Diagnosis Date   AAA (abdominal aortic aneurysm) without rupture (HCC)    Coronary artery disease    a. s/p CABG 1987;  b. s/p DES x 2 to SVG in 2005; c. s/p DES x 2 to SVG  in 2007;  d. 2 DES placed to SVG and OM1 in 2010 ;  e. s/p PTCA mid body of SVG to OM1/OM2 due to ISR 08/13/12;  f. 10/2012 NSTEMI/Cath/PCI: native 3vd, VG->OM 99isr (3.0x16 Promus DES), LIMA->LAD nl, RIMA->RCA nl, EF 50%.   Diabetes mellitus, type 2 (HCC)    Dyslipidemia (high LDL; low HDL)    Gout    Hyperlipidemia    Hypertension    Myositis    Osteoarthritis    Osteoporosis    Paroxysmal supraventricular tachycardia ([place])     Past Surgical History:  Procedure Laterality Date   CARDIAC SURGERY     CARDIOVERSION N/A 12/19/2017   Procedure: CARDIOVERSION;  Surgeon: Jerline Pain, MD;  Location: [facility];  Service: Cardiovascular;  Laterality: N/A;   CORONARY STENT PLACEMENT     EYE SURGERY Right March 2016   Cataract   LEFT HEART CATHETERIZATION WITH CORONARY ANGIOGRAM N/A 08/13/2012   Procedure: LEFT HEART CATHETERIZATION WITH CORONARY ANGIOGRAM;  Surgeon: Burnell Blanks, MD;  Location[facility] CATH LAB;  Service: Cardiovascular;  Laterality: N/A;   LEFT HEART CATHETERIZATION WITH CORONARY/GRAFT ANGIOGRAM N/A 11/19/2012   Procedure: LEFT HEART CATHETERIZATION WITH [name];  Surgeon: [name], MD;  Location: [facility] CATH LAB;  Service: Cardiovascular;  Laterality: N/A;   PROSTATE SURGERY       Current Outpatient Medications  Medication Sig Dispense Refill   amiodarone (PACERONE) 200 MG tablet TAKE 1 TABLET BY MOUTH EVERY DAY 90 tablet 1   apixaban (ELIQUIS)  2.5 MG TABS tablet Take 1 tablet (2.5 mg total) by mouth 2 (two) times daily. 180 tablet 3   carvedilol (COREG) 3.125 MG tablet Take 1 tablet (3.125 mg total) by mouth 2 (two) times daily with a meal. 60 tablet 0   ferrous sulfate 325 (65 FE) MG tablet Take by mouth.     furosemide (LASIX) 20 MG tablet Take 1 tablet (20 mg total) by mouth daily. 90 tablet 3   glimepiride (AMARYL) 4 MG tablet Take 2 mg by mouth daily.      Glucosamine Sulfate 1500 MG PACK Take by mouth.      isosorbide mononitrate (IMDUR) 30 MG 24 hr tablet Take 1 tablet (30 mg total) by mouth daily. 30 tablet 0   lisinopril (ZESTRIL) 10 MG tablet Take 1 tablet (10 mg total) by mouth daily. 90 tablet 3   loratadine (CLARITIN) 10 MG tablet Take 10 mg by mouth at bedtime.     Misc Natural Products (OSTEO BI-FLEX/5-LOXIN ADVANCED) TABS Take 2 tablets by mouth daily.     nitroGLYCERIN (NITROSTAT) 0.4 MG SL tablet Place 1 tablet (0.4 mg total) under the tongue every 5 (five) minutes as needed. For chest pain 25 tablet 3   Omega-3 Fatty Acids (FISH OIL PO) Take 2 capsules by mouth daily.      potassium chloride (K-DUR) 10 MEQ tablet Take 20 meq in the morning and 81meq in the evening 270 tablet 3   rosuvastatin (CRESTOR) 10 MG tablet Take 1 tablet (10 mg total) by mouth daily. 90 tablet 1   spironolactone (ALDACTONE) 25 MG tablet TAKE 1 TABLET BY MOUTH EVERY DAY 90 tablet 0   No current facility-administered medications for this visit.    Allergies:   Lipitor [atorvastatin], Metformin, Morphine and related, Pravastatin sodium, and Niaspan [niacin er]    Social History:  The patient  reports that he quit smoking about 34 years ago. His smoking use included cigarettes. He has never used smokeless tobacco. He reports that he does not drink alcohol and does not use drugs.   Family History:  The patient's family history includes Aneurysm in his sister; Hypertension in his father; Thyroid disease in his mother.    ROS:  Please see the history of present illness.   Otherwise, review of systems are positive for none.   All other systems are reviewed and negative.    PHYSICAL EXAM: VS:  BP 128/60    Pulse (!) 50    Ht 6\' 1"  (1.854 m)    Wt 247 lb 3.2 oz (112.1 kg)    SpO2 98%    BMI 32.61 kg/m  , BMI Body mass index is 32.61 kg/m. GEN: Well nourished, well developed, in no acute distress  HEENT: normal  Neck: no JVD, carotid bruits, or masses Cardiac: Regular rate and rhythm and mildly  bradycardic; no murmurs, rubs, or gallops, mild bilateral leg edema Respiratory:  clear to auscultation bilaterally, normal work of breathing GI: soft, nontender, nondistended, + BS MS: no deformity or atrophy  Skin: warm and dry, no rash Neuro:  Strength and sensation are intact Psych: euthymic mood, full affect   EKG:  EKG is ordered today. The ekg ordered today demonstrates sinus bradycardia with left bundle branch block.  Heart rate is 50 bpm  Recent Labs: 05/11/2019: BUN 34; Creatinine, Ser 1.69; Potassium 5.0; Sodium 139    Lipid Panel    Component Value Date/Time   CHOL 108 08/13/2012 0735   TRIG 127 08/13/2012 0735  HDL 25 (L) 08/13/2012 0735   CHOLHDL 4.3 08/13/2012 0735   VLDL 25 08/13/2012 0735   LDLCALC 58 08/13/2012 0735      Wt Readings from Last 3 Encounters:  03/01/20 247 lb 3.2 oz (112.1 kg)  02/25/20 (!) 245 lb 9.6 oz (111.4 kg)  08/11/19 231 lb 9.6 oz (105.1 kg)        ASSESSMENT AND PLAN:  1.  Persistent atrial fibrillation: He is maintaining in sinus rhythm with amiodarone.  He has chronic bradycardia but denies any symptoms.  If bradycardia worsens, recommend stopping small dose carvedilol.  He is tolerating anticoagulation with low-dose Eliquis.  The dose was decreased to 2.5 mg since his creatinine is slightly above 1.5 and he is above 50 years old.    2.  Chronic systolic heart failure: Most recent ejection fraction was 45 to 50%.  Currently on small dose carvedilol, lisinopril and spironolactone.   3. Coronary artery disease involving bypass graft without angina: No antiplatelet therapy at the present time given that he is on Eliquis.  4. Hyperlipidemia: Continue treatment with rosuvastatin.  Recent LDL was 55.  5.  Chronic venous insufficiency with recurrent ulceration.  Continue support stockings.   Disposition:   FU with me in 6 months  Signed,  [name], MD  03/01/2020 2:17 PM    [place]

## 2020-03-04 ENCOUNTER — Other Ambulatory Visit: Payer: Self-pay | Admitting: Cardiovascular Disease

## 2020-03-04 NOTE — Telephone Encounter (Signed)
Refill request

## 2020-03-04 NOTE — Telephone Encounter (Signed)
Age 85, weight 112kg, SCr 1.69 on  05/11/19, last OV 3 days ago, afib indication.  Do not know why pharmacy is requesting refill on 5mg  dose as pt has been taking 2.5mg  dose and rx for that strength was sent to pharmacy last fall.

## 2020-03-07 ENCOUNTER — Other Ambulatory Visit: Payer: Self-pay | Admitting: Cardiovascular Disease

## 2020-04-25 ENCOUNTER — Other Ambulatory Visit: Payer: Self-pay | Admitting: Cardiovascular Disease

## 2020-04-25 DIAGNOSIS — I4819 Other persistent atrial fibrillation: Secondary | ICD-10-CM

## 2020-04-25 DIAGNOSIS — Z01812 Encounter for preprocedural laboratory examination: Secondary | ICD-10-CM

## 2020-04-25 DIAGNOSIS — R5383 Other fatigue: Secondary | ICD-10-CM

## 2020-04-25 DIAGNOSIS — D689 Coagulation defect, unspecified: Secondary | ICD-10-CM

## 2020-04-25 NOTE — Telephone Encounter (Signed)
Refill request

## 2020-04-27 ENCOUNTER — Other Ambulatory Visit: Payer: Self-pay | Admitting: Cardiovascular Disease

## 2020-04-27 NOTE — Telephone Encounter (Signed)
Refill request

## 2020-05-16 ENCOUNTER — Telehealth: Payer: Self-pay | Admitting: Cardiovascular Disease

## 2020-05-16 MED ORDER — APIXABAN 5 MG PO TABS: ORAL_TABLET | ORAL | 6 refills | Status: DC

## 2020-05-16 NOTE — Telephone Encounter (Signed)
Spoke to patient he stated he would like Eliquis 5 mg tablet sent to pharmacy.He will take 1/2 tablet twice a day.Stated 5 mg tablet is same price as 2.5 mg tablet.Stated this would save money.Eliquis 5 mg take 1/2 tablet twice a day sent to pharmacy.

## 2020-05-16 NOTE — Telephone Encounter (Signed)
Pt c/o medication issue:  1. Name of Medication: apixaban (ELIQUIS) 2.5 MG TABS tablet  2. How are you currently taking this medication (dosage and times per day)? As written  3. Are you having a reaction (difficulty breathing--STAT)? No  4. What is your medication issue? Pt wants to get presciption for Eliquis 5 MG because he said he would get 160 pill instead of 60 pills for 2.5 MG. Said Dr. Fletcher Anon told him in July or August to start cutting the 5 MG tablet in half and take 2.5 MG

## 2020-06-01 ENCOUNTER — Other Ambulatory Visit: Payer: Self-pay

## 2020-06-01 ENCOUNTER — Encounter: Payer: Self-pay | Admitting: Vascular Surgery

## 2020-06-01 ENCOUNTER — Ambulatory Visit (INDEPENDENT_AMBULATORY_CARE_PROVIDER_SITE_OTHER): Payer: Medicare Other | Admitting: Vascular Surgery

## 2020-06-01 VITALS — BP 150/62 | HR 55 | Temp 97.5°F | Resp 20 | Ht 73.2 in | Wt 248.0 lb

## 2020-06-01 DIAGNOSIS — I83813 Varicose veins of bilateral lower extremities with pain: Secondary | ICD-10-CM | POA: Diagnosis not present

## 2020-06-01 DIAGNOSIS — I2510 Atherosclerotic heart disease of native coronary artery without angina pectoris: Secondary | ICD-10-CM

## 2020-06-01 NOTE — Progress Notes (Signed)
Patient is an 85 year old male who returns for follow-up today.  He was last seen July 2021.  At that time he had ulcerations on both lower extremities.  These healed with compression therapy.  He currently has no open ulcers.  He has other complaints of intermittent numbness and tingling in his lower extremities as well as bilateral knee pain.  He states that they have told him he is not a candidate for any operation to fix his knees.  He is on Eliquis for atrial fibrillation.  He also has known abdominal aortic aneurysm last measured 4 cm in diameter July 2020.  Other chronic medical problems include hypertension hyperlipidemia diabetes all of which have been stable.  Past Medical History:  Diagnosis Date  . AAA (abdominal aortic aneurysm) without rupture ([place])   . Coronary artery disease    a. s/p CABG 1987;  b. s/p DES x 2 to SVG in 2005; c. s/p DES x 2 to SVG in 2007;  d. 2 DES placed to SVG and OM1 in 2010 ;  e. s/p PTCA mid body of SVG to OM1/OM2 due to ISR 08/13/12;  f. 10/2012 NSTEMI/Cath/PCI: native 3vd, VG->OM 99isr (3.0x16 Promus DES), LIMA->LAD nl, RIMA->RCA nl, EF 50%.  . Diabetes mellitus, type 2 ([place])   . Dyslipidemia (high LDL; low HDL)   . Gout   . Hyperlipidemia   . Hypertension   . Myositis   . Osteoarthritis   . Osteoporosis   . Paroxysmal supraventricular tachycardia [facility])     Past Surgical History:  Procedure Laterality Date  . CARDIAC SURGERY    . CARDIOVERSION N/A 12/19/2017   Procedure: CARDIOVERSION;  Surgeon: Jerline Pain, MD;  Location: [facility];  Service: Cardiovascular;  Laterality: N/A;  . CORONARY STENT PLACEMENT    . EYE SURGERY Right March 2016   Cataract  . LEFT HEART CATHETERIZATION WITH CORONARY ANGIOGRAM N/A 08/13/2012   Procedure: LEFT HEART CATHETERIZATION WITH CORONARY ANGIOGRAM;  Surgeon: Burnell Blanks, MD;  [facility] C[facility] LAB;  Service: Cardiovascular;  Laterality: N/A;  . LEFT HEART CATHETERIZATION WITH CORONARY/GRAFT ANGIOGRAM  N/A 11/19/2012   Procedure: LEFT HEART CATHETERIZATION WITH [name];  Surgeon: [name], MD;  Location: [facility] CATH LAB;  Service: Cardiovascular;  Laterality: N/A;  . PROSTATE SURGERY     Physical exam:  Vitals:   06/01/20 1312  BP: (!) 150/62  Pulse: (!) 55  Resp: 20  Temp: (!) 97.5 F (36.4 C)  TempSrc: Temporal  SpO2: 97%  Weight: 248 lb (112.5 kg)  Height: 6' 1.2" (1.859 m)    Extremities: 2+ femoral dorsalis pedis pulses bilaterally  Skin: No ulcer or rash some hemosiderin staining in the gaiter area bilaterally   Plan: Patient needs follow-up ultrasound abdominal aortic aneurysm in July 2021 and be seen in our APP clinic.  As far as his lower extremity ulcers are concerned these are now healed his vein is only slightly dilated I believe the best option for him long-term is good to be compression stockings to prevent further irritation and skin breakdown.  If he has recurrent ulcerations we could revisit whether or not to consider laser ablation in the left leg.  The right leg vein was fairly small and probably not amenable to laser ablation.  Ruta Hinds, MD [facility] Office: [phone]

## 2020-06-06 ENCOUNTER — Other Ambulatory Visit: Payer: Self-pay | Admitting: Cardiovascular Disease

## 2020-06-15 DIAGNOSIS — M7989 Other specified soft tissue disorders: Secondary | ICD-10-CM

## 2020-07-18 ENCOUNTER — Other Ambulatory Visit: Payer: Self-pay | Admitting: Cardiovascular Disease

## 2020-07-18 DIAGNOSIS — D689 Coagulation defect, unspecified: Secondary | ICD-10-CM

## 2020-07-18 DIAGNOSIS — Z01812 Encounter for preprocedural laboratory examination: Secondary | ICD-10-CM

## 2020-07-18 DIAGNOSIS — I4819 Other persistent atrial fibrillation: Secondary | ICD-10-CM

## 2020-07-18 DIAGNOSIS — R5383 Other fatigue: Secondary | ICD-10-CM

## 2020-07-18 NOTE — Telephone Encounter (Signed)
Please review for refill. Thanks!  

## 2020-07-21 ENCOUNTER — Telehealth: Payer: Self-pay | Admitting: Cardiovascular Disease

## 2020-07-21 NOTE — Telephone Encounter (Signed)
Patient states he received a text message stating something about 2 new medications from Dr. Fletcher Anon. He would like someone to call him back and let him know what this is about.

## 2020-07-21 NOTE — Telephone Encounter (Signed)
The patient has been made aware that refills had been sent in but nothing new had been started according to our records. He has verbalized his understanding.

## 2020-08-30 ENCOUNTER — Encounter: Payer: Self-pay | Admitting: Cardiovascular Disease

## 2020-08-30 ENCOUNTER — Other Ambulatory Visit: Payer: Self-pay

## 2020-08-30 ENCOUNTER — Ambulatory Visit (INDEPENDENT_AMBULATORY_CARE_PROVIDER_SITE_OTHER): Payer: Medicare HMO | Admitting: Cardiovascular Disease

## 2020-08-30 VITALS — BP 122/62 | HR 51 | Ht 73.0 in | Wt 240.4 lb

## 2020-08-30 DIAGNOSIS — I4819 Other persistent atrial fibrillation: Secondary | ICD-10-CM

## 2020-08-30 DIAGNOSIS — I5022 Chronic systolic (congestive) heart failure: Secondary | ICD-10-CM | POA: Diagnosis not present

## 2020-08-30 DIAGNOSIS — E785 Hyperlipidemia, unspecified: Secondary | ICD-10-CM | POA: Diagnosis not present

## 2020-08-30 DIAGNOSIS — I2510 Atherosclerotic heart disease of native coronary artery without angina pectoris: Secondary | ICD-10-CM | POA: Diagnosis not present

## 2020-08-30 DIAGNOSIS — I872 Venous insufficiency (chronic) (peripheral): Secondary | ICD-10-CM

## 2020-08-30 NOTE — Progress Notes (Signed)
Cardiology Office Note   Date:  08/30/2020   ID:  Noah Mahoney, DOB [birth year], MRN [id number]  PCP:  [name], MD  Cardiologist:   [name], MD   No chief complaint on file.     History of Present Illness: Noah Mahoney is a 86 y.o. male who presents for a follow-up visit regarding coronary artery disease , persistent atrial fibrillation and chronic systolic heart failure. He is status post coronary artery bypass grafting in 1987 with multiple interventions on the vein graft to the obtuse marginal.  Most recent cardiac catheterization in April of 2014 showed severe native three-vessel coronary artery disease with patent LIMA to LAD and RIMA to RCA. SVG to OM was patent but with 99% in-stent restenosis at the site of previous Cutting Balloon angioplasty. He underwent angioplasty and Promus drug-eluting stent placement.  Anginal symptoms are usually manifested by left shoulder pain.  Other medical issues include medium size AAA followed annually by VVS, left bundle branch block, type 2 diabetes, GERD and arthritis.  He was hospitalized in 2019 for atrial fibrillation complicated by heart failure with a drop in ejection fraction to 25%.  He underwent successful cardioversion after loading with amiodarone. Repeat echocardiogram in February 2020 showed improvement in ejection fraction to 45 to 50%.  He has chronic venous insufficiency with recurrent ulceration.  This is being managed by Dr. Oneida Alar.    He has been doing well with no recent chest pain, shortness of breath or palpitations.  No dizziness, syncope or presyncope.  He is getting ready to start his vegetable garden.  He likes to grow tomatoes.   Past Medical History:  Diagnosis Date  . AAA (abdominal aortic aneurysm) without rupture ([place])   . Coronary artery disease    a. s/p CABG 1987;  b. s/p DES x 2 to SVG in 2005; c. s/p DES x 2 to SVG in 2007;  d. 2 DES placed to SVG and OM1 in 2010 ;  e. s/p PTCA mid  body of SVG to OM1/OM2 due to ISR 08/13/12;  f. 10/2012 NSTEMI/Cath/PCI: native 3vd, VG->OM 99isr (3.0x16 Promus DES), LIMA->LAD nl, RIMA->RCA nl, EF 50%.  . Diabetes mellitus, type 2 ([place])   . Dyslipidemia (high LDL; low HDL)   . Gout   . Hyperlipidemia   . Hypertension   . Myositis   . Osteoarthritis   . Osteoporosis   . Paroxysmal supraventricular tachycardia [facility])     Past Surgical History:  Procedure Laterality Date  . CARDIAC SURGERY    . CARDIOVERSION N/A 12/19/2017   Procedure: CARDIOVERSION;  Surgeon: Jerline Pain, MD;  Location: [facility];  Service: Cardiovascular;  Laterality: N/A;  . CORONARY STENT PLACEMENT    . EYE SURGERY Right March 2016   Cataract  . LEFT HEART CATHETERIZATION WITH CORONARY ANGIOGRAM N/A 08/13/2012   Procedure: LEFT HEART CATHETERIZATION WITH CORONARY ANGIOGRAM;  Surgeon: Burnell Blanks, MD;  Location: [facility] CATH LAB;  Service: Cardiovascular;  Laterality: N/A;  . LEFT HEART CATHETERIZATION WITH CORONARY/GRAFT ANGIOGRAM N/A 11/19/2012   Procedure: LEFT HEART CATHETERIZATION WITH [name];  Surgeon: [name], MD;  Location: [place] CATH LAB;  Service: Cardiovascular;  Laterality: N/A;  . PROSTATE SURGERY       Current Outpatient Medications  Medication Sig Dispense Refill  . amiodarone (PACERONE) 200 MG tablet TAKE 1 TABLET BY MOUTH EVERY DAY 90 tablet 3  . apixaban (ELIQUIS) 5 MG TABS tablet Take 1/2 tablet ( 2.5  mg ) twice a day 60 tablet 6  . carvedilol (COREG) 3.125 MG tablet Take 1 tablet (3.125 mg total) by mouth 2 (two) times daily with a meal. 60 tablet 0  . ELIQUIS 2.5 MG TABS tablet Take 2.5 mg by mouth 2 (two) times daily.    . ferrous sulfate 325 (65 FE) MG tablet Take by mouth.    . furosemide (LASIX) 20 MG tablet TAKE 1 TABLET BY MOUTH EVERY DAY 90 tablet 3  . glimepiride (AMARYL) 4 MG tablet Take 2 mg by mouth daily.     . Glucosamine Sulfate 1500 MG PACK Take by mouth.    . isosorbide mononitrate (IMDUR) 30  MG 24 hr tablet Take 1 tablet (30 mg total) by mouth daily. 30 tablet 0  . lisinopril (ZESTRIL) 10 MG tablet TAKE 1 TABLET BY MOUTH EVERY DAY 90 tablet 1  . loratadine (CLARITIN) 10 MG tablet Take 10 mg by mouth at bedtime.    . Misc Natural Products (OSTEO BI-FLEX/5-LOXIN ADVANCED) TABS Take 2 tablets by mouth daily.    . nitroGLYCERIN (NITROSTAT) 0.4 MG SL tablet Place 1 tablet (0.4 mg total) under the tongue every 5 (five) minutes as needed. For chest pain 25 tablet 3  . Omega-3 Fatty Acids (FISH OIL PO) Take 2 capsules by mouth daily.     . potassium chloride (KLOR-CON) 10 MEQ tablet TAKE 2 TABLET BY MOUTH IN THE MORNING AND 1 TABLET IN THE EVENING 270 tablet 3  . rosuvastatin (CRESTOR) 10 MG tablet TAKE 1 TABLET BY MOUTH EVERY DAY 90 tablet 0  . spironolactone (ALDACTONE) 25 MG tablet TAKE 1 TABLET BY MOUTH EVERY DAY 90 tablet 0   No current facility-administered medications for this visit.    Allergies:   Lipitor [atorvastatin], Metformin, Morphine and related, Pravastatin sodium, and Niaspan [niacin er]    Social History:  The patient  reports that he quit smoking about 34 years ago. His smoking use included cigarettes. He has never used smokeless tobacco. He reports that he does not drink alcohol and does not use drugs.   Family History:  The patient's family history includes Aneurysm in his sister; Hypertension in his father; Thyroid disease in his mother.    ROS:  Please see the history of present illness.   Otherwise, review of systems are positive for none.   All other systems are reviewed and negative.    PHYSICAL EXAM: VS:  BP 122/62   Pulse (!) 51   Ht 6\' 1"  (1.854 m)   Wt 240 lb 6.4 oz (109 kg)   BMI 31.72 kg/m  , BMI Body mass index is 31.72 kg/m. GEN: Well nourished, well developed, in no acute distress  HEENT: normal  Neck: no JVD, carotid bruits, or masses Cardiac: Regular rate and rhythm and mildly bradycardic; no murmurs, rubs, or gallops, mild bilateral leg  edema Respiratory:  clear to auscultation bilaterally, normal work of breathing GI: soft, nontender, nondistended, + BS MS: no deformity or atrophy  Skin: warm and dry, no rash Neuro:  Strength and sensation are intact Psych: euthymic mood, full affect   EKG:  EKG is ordered today. The ekg ordered today demonstrates sinus bradycardia with left bundle branch block.  Heart rate is 51 bpm  Recent Labs: No results found for requested labs within last 8760 hours.    Lipid Panel    Component Value Date/Time   CHOL 108 08/13/2012 0735   TRIG 127 08/13/2012 0735   HDL 25 (L)  08/13/2012 0735   CHOLHDL 4.3 08/13/2012 0735   VLDL 25 08/13/2012 0735   LDLCALC 58 08/13/2012 0735      Wt Readings from Last 3 Encounters:  08/30/20 240 lb 6.4 oz (109 kg)  06/01/20 248 lb (112.5 kg)  03/01/20 247 lb 3.2 oz (112.1 kg)        ASSESSMENT AND PLAN:  1.  Persistent atrial fibrillation: He is maintaining in sinus rhythm with amiodarone.  He has chronic bradycardia but denies any symptoms.  If bradycardia worsens, recommend stopping small dose carvedilol.  He is tolerating anticoagulation with low-dose Eliquis.  Most recent creatinine was 1.48.  His kidney function tends to fluctuate and creatinine is frequently above 1.5.  Thus, I elected to keep the same dose of Eliquis 2.5 mg twice daily.  2.  Chronic systolic heart failure: Most recent ejection fraction was 45 to 50%.  Currently on small dose carvedilol, lisinopril and spironolactone.  He seems to be euvolemic.  3. Coronary artery disease involving bypass graft without angina: No antiplatelet therapy at the present time given that he is on Eliquis.  4. Hyperlipidemia: Continue treatment with rosuvastatin.  Recent LDL was 55.  5.  Chronic venous insufficiency with recurrent ulceration.  Continue support stockings.   Disposition:   FU with me in 6 months  Signed,  [name], MD  08/30/2020 10:12 AM    [place]

## 2020-08-30 NOTE — Patient Instructions (Signed)

## 2020-09-19 IMAGING — CT CT ANGIOGRAPHY ABDOMEN AND PELVIS WITH CONTRAST AND WITHOUT CONT
1 of 4 series · 11 of 32 positions shown, 16 images · IV contrast (APPLIED)
Comparison: Prior MRI of the abdomen 07/26/2018; prior CT scan of
the abdomen and pelvis 12/12/2017

CLINICAL DATA: 84-year-old male with a history of abdominal aortic
aneurysm and right renal tumor

EXAM:
CT ANGIOGRAPHY ABDOMEN AND PELVIS WITH CONTRAST AND WITHOUT CONTRAST
TECHNIQUE: Multidetector CT imaging of the abdomen and pelvis was performed
using the standard protocol during bolus administration of
intravenous contrast. Multiplanar reconstructed images and MIPs were
obtained and reviewed to evaluate the vascular anatomy.
Creatinine was obtained on site at [HOSPITAL] at [HOSPITAL].
Results: Creatinine 1.5 mg/dL.
CONTRAST:  60mL S9VZYF-ZH4 IOPAMIDOL ([id number]) INJECTION 76%

[Series 5: pre stent angio · axial · non-contrast · 0.98mm/px · z∈[-525,-86]mm · 11 of 499 slices shown, 16 images]
[im 30/499  soft-tissue]
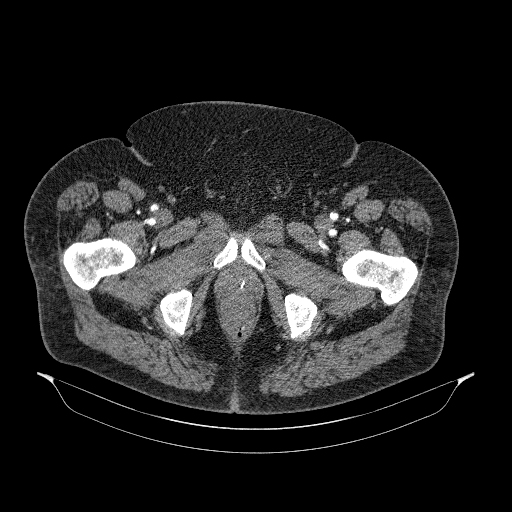
[im 30/499  bone]
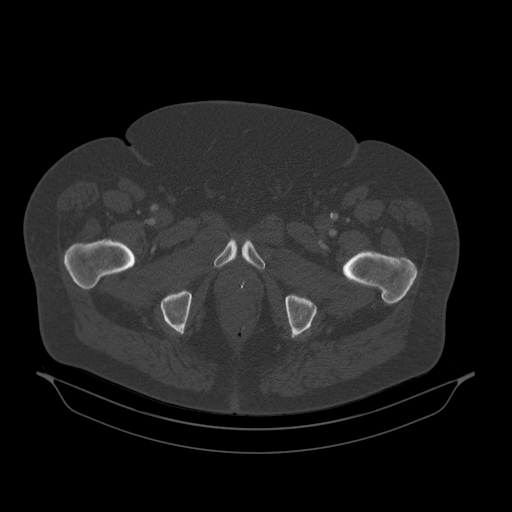
[im 88/499  soft-tissue]
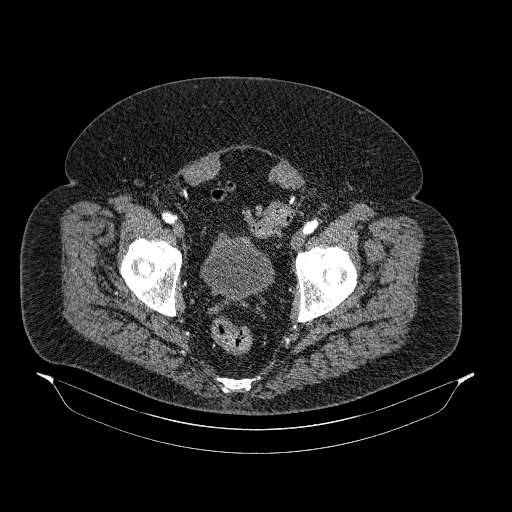
[im 147/499  soft-tissue]
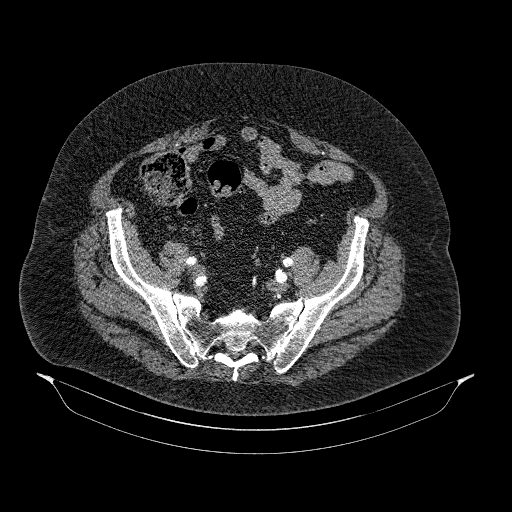
[im 176/499  soft-tissue]
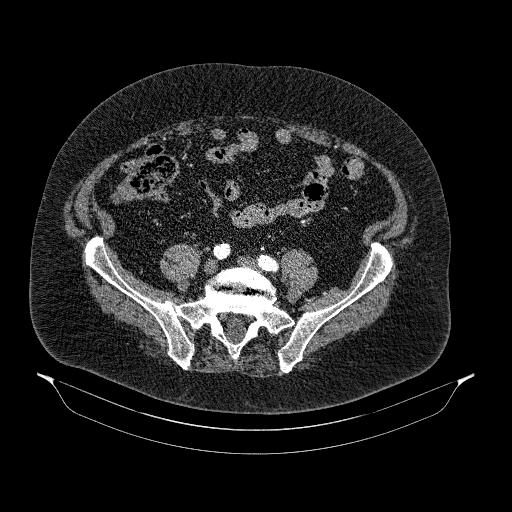
[im 235/499  soft-tissue]
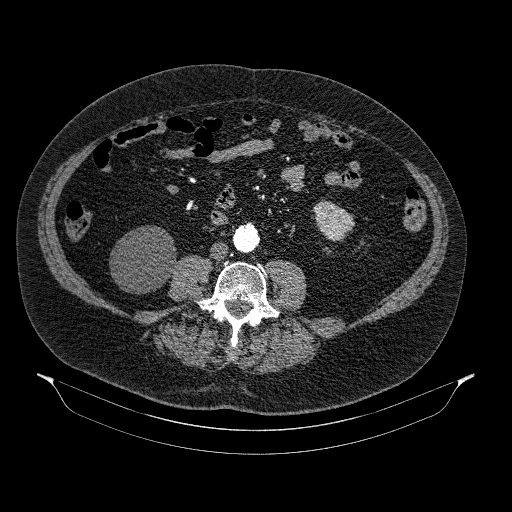
[im 264/499  soft-tissue]
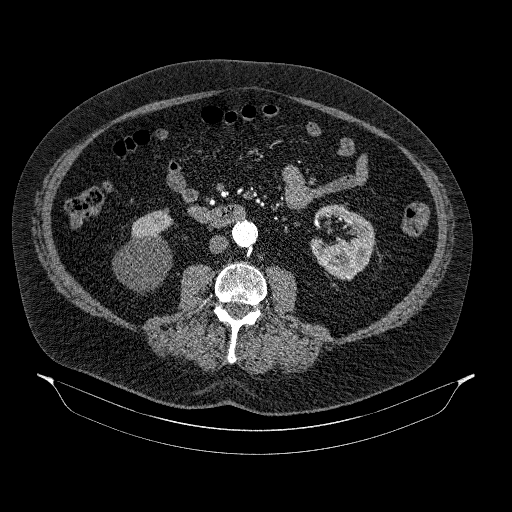
[im 323/499  soft-tissue]
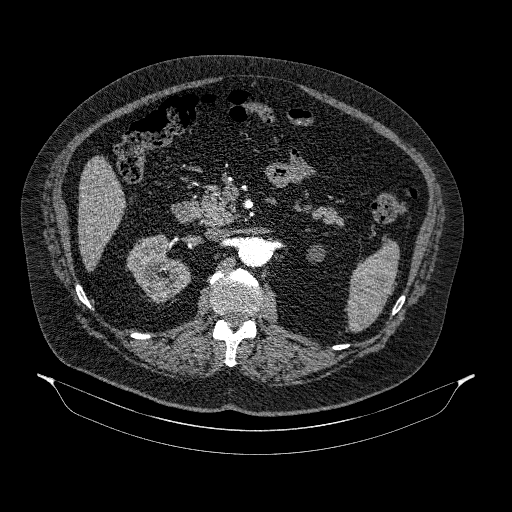
[im 381/499  soft-tissue]
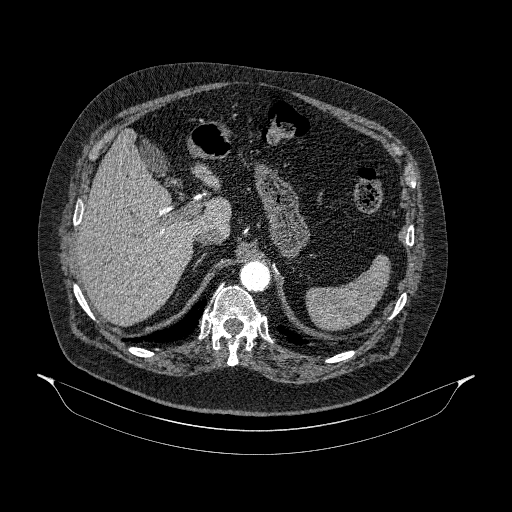
[im 381/499  lung]
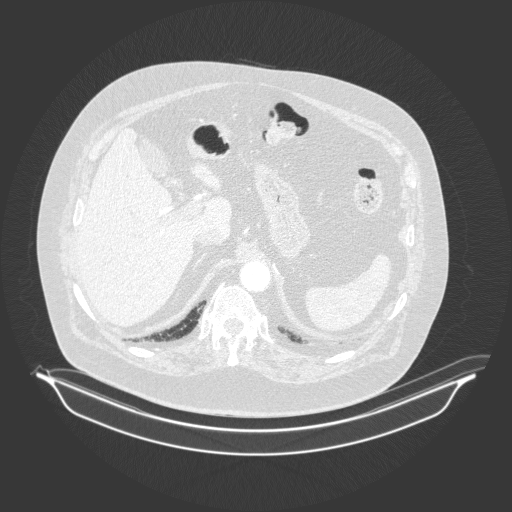
[im 411/499  soft-tissue]
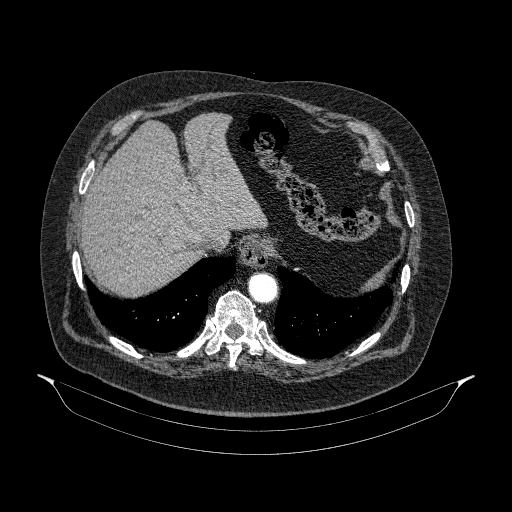
[im 411/499  lung]
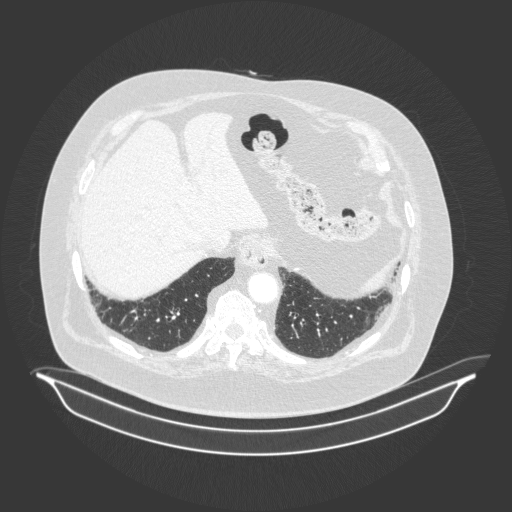
[im 411/499  bone]
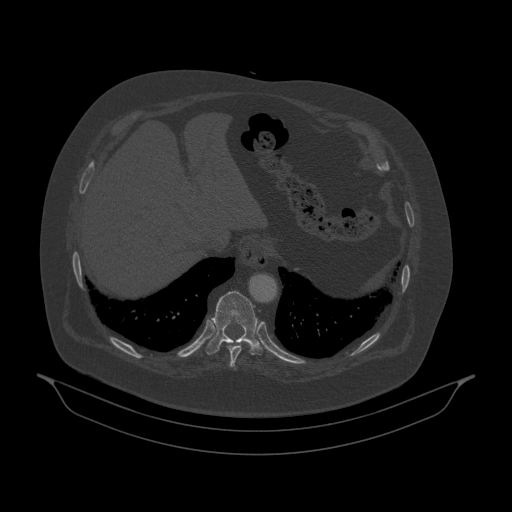
[im 440/499  lung]
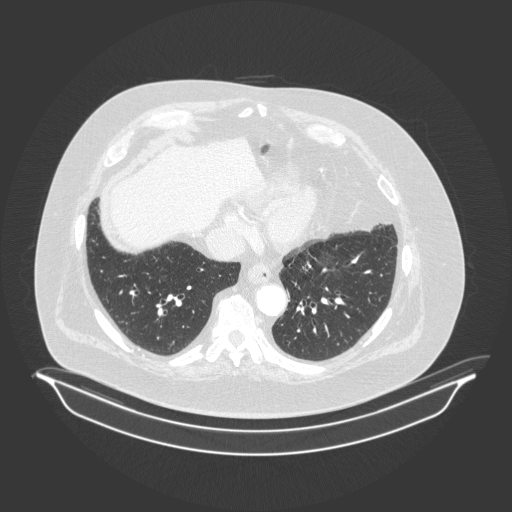
[im 469/499  soft-tissue]
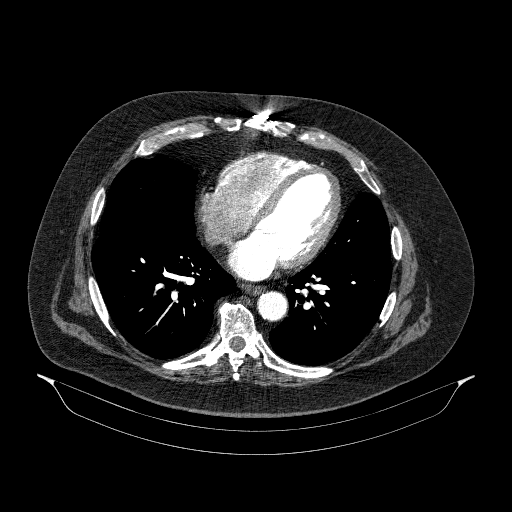
[im 469/499  lung]
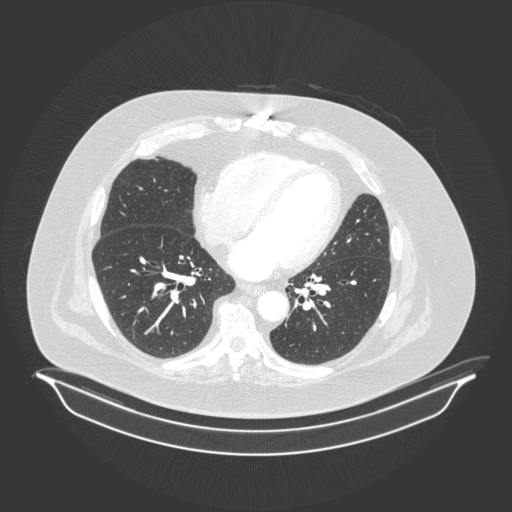

[11 of 32 positions shown; findings below may reference images not displayed]

FINDINGS: VASCULAR

Aorta: Focal fusiform, bordering on saccular aneurysmal dilatation
of the most proximal infrarenal abdominal aorta. The posterior wall
of the aorta is focally aneurysmal resulting in a maximal transverse
diameter of 3.8 cm. This is essentially stable compared to the prior
MRI from 07/26/2018 and slightly smaller in size compared to
12/12/2017. I suspect that this represents a region of penetrating
atherosclerotic ulcer which has stabilized. There is decreased mural
thrombus. Scattered atherosclerotic calcifications present in the
remaining aorta. No dissection.

Celiac: Mild calcified plaque at the origin of the celiac artery
without significant stenosis. No evidence of aneurysm or dissection.

SMA: Mild calcified plaque without significant stenosis. No evidence
of aneurysm or dissection.

Renals: Solitary renal arteries bilaterally. Minimal atherosclerotic
plaque without evidence of stenosis, dissection or fibromuscular
dysplasia.

IMA: Patent without evidence of aneurysm, dissection, vasculitis or
significant stenosis.

Inflow: Calcified plaque throughout the iliac arterial system. Mild
ectasia of the distal left common iliac artery with a maximal
diameter of 1.8 cm. No hemodynamically significant stenosis. No
evidence of dissection or FMD.

Proximal Outflow: Calcified atherosclerotic plaque without aneurysm,
significant stenosis or dissection. Mild focal stenosis of the
proximal superficial femoral arteries bilaterally.

Veins: No focal venous abnormality.

Review of the MIP images confirms the above findings.

NON-VASCULAR

Lower chest: Small hiatal hernia. No acute abnormality. Stable
bilateral lower lobe pulmonary nodules dating back to 0139, benign.

Hepatobiliary: Normal hepatic contour and morphology. No discrete
hepatic lesions. Normal appearance of the gallbladder. No intra or
extrahepatic biliary ductal dilatation.

Pancreas: Unremarkable. No pancreatic ductal dilatation or
surrounding inflammatory changes.

Spleen: Punctate calcifications most consistent with old
granulomatous disease.

Adrenals/Urinary Tract: Normal adrenal glands. Stable simple cysts
exophytic from the left kidney. Enhancing solid mass arising from
the posterior interpolar right kidney measures approximately 3.9 x
3.2 cm consistent with the patient's known renal cell carcinoma.
Additional circumscribed low-attenuation cystic lesions scattered
throughout the right kidney consistent with simple cysts. No
evidence of hydronephrosis. No nephrolithiasis.

Stomach/Bowel: Colonic diverticular disease without CT evidence of
active inflammation. No focal bowel wall thickening or evidence of
obstruction. Normal appendix in the right lower quadrant.

Lymphatic: No suspicious lymphadenopathy.

Reproductive: Prostate is unremarkable.

Other: No abdominal wall hernia or evidence of ascites. Tiny fat
containing umbilical hernia.

Musculoskeletal: No acute fracture or aggressive appearing lytic or
blastic osseous lesion. Lower lumbar degenerative disc disease.
IMPRESSION: VASCULAR

1. Decreasing size of a juxtarenal abdominal aortic aneurysm
compared to 12/12/2017. The maximal diameter measures 3.8 cm on
today's study compared to a maximum of 4.0 cm (by my measurements)
previously. Additionally, there is less wall adherent thrombus. The
configuration of the aneurysm suggests that this began as a focal
penetrating atherosclerotic ulcer along the posterior wall which
subsequently involved into a focal dissection/intramural hematoma
which then became focally aneurysmal. The slight interval
improvement may represent normal evolution of this abnormality.
Recommend followup by ultrasound in 2 years. This recommendation
follows ACR consensus guidelines: White Paper of the ACR Incidental
Findings Committee II on Vascular Findings. [HOSPITAL] 8338;
[DATE].
2.  Aortic Atherosclerosis (Z8UW0-170.0).

NON-VASCULAR

1. No significant interval change in the size or appearance of the
enhancing neoplasm arising from the posterior interpolar right
kidney most consistent with a renal cell carcinoma.
2. Colonic diverticular disease without CT evidence of active
inflammation.
3. Additional ancillary findings as above without significant
interval change.

## 2020-10-17 ENCOUNTER — Other Ambulatory Visit: Payer: Self-pay | Admitting: Cardiovascular Disease

## 2020-10-17 DIAGNOSIS — D689 Coagulation defect, unspecified: Secondary | ICD-10-CM

## 2020-10-17 DIAGNOSIS — I4819 Other persistent atrial fibrillation: Secondary | ICD-10-CM

## 2020-10-17 DIAGNOSIS — R5383 Other fatigue: Secondary | ICD-10-CM

## 2020-10-17 DIAGNOSIS — Z01812 Encounter for preprocedural laboratory examination: Secondary | ICD-10-CM

## 2020-10-18 NOTE — Telephone Encounter (Signed)
Refill request

## 2021-01-05 ENCOUNTER — Other Ambulatory Visit: Payer: Self-pay | Admitting: Cardiovascular Disease

## 2021-01-05 NOTE — Telephone Encounter (Signed)
Prescription refill request for Eliquis received. Indication: Atrial fib Last office visit: 08/30/20 Arida Scr: 1.48 on 08/03/20 Age: 86 Weight: 109kg  Based on above findings Eliquis 2.5mg  twice daily is the appropriate dose (Renal).  Refill approved.

## 2021-02-10 ENCOUNTER — Other Ambulatory Visit: Payer: Self-pay | Admitting: Cardiovascular Disease

## 2021-02-10 DIAGNOSIS — Z01812 Encounter for preprocedural laboratory examination: Secondary | ICD-10-CM

## 2021-02-10 DIAGNOSIS — D689 Coagulation defect, unspecified: Secondary | ICD-10-CM

## 2021-02-10 DIAGNOSIS — R5383 Other fatigue: Secondary | ICD-10-CM

## 2021-02-10 DIAGNOSIS — I4819 Other persistent atrial fibrillation: Secondary | ICD-10-CM

## 2021-02-10 NOTE — Telephone Encounter (Signed)
Refill Request.  

## 2021-02-17 ENCOUNTER — Other Ambulatory Visit: Payer: Self-pay | Admitting: *Deleted

## 2021-02-17 DIAGNOSIS — I714 Abdominal aortic aneurysm, without rupture, unspecified: Secondary | ICD-10-CM

## 2021-02-27 ENCOUNTER — Ambulatory Visit: Payer: Medicare HMO | Admitting: Physician Assistant

## 2021-02-27 ENCOUNTER — Other Ambulatory Visit: Payer: Self-pay

## 2021-02-27 ENCOUNTER — Ambulatory Visit (HOSPITAL_COMMUNITY)
Admission: RE | Admit: 2021-02-27 | Discharge: 2021-02-27 | Disposition: A | Discharge status: Home / Self Care | Payer: Medicare HMO | Source: Ambulatory Visit | Attending: Surgery | Admitting: Surgery

## 2021-02-27 VITALS — BP 146/59 | HR 45 | Temp 97.3°F | Resp 18 | Ht 73.0 in | Wt 229.0 lb

## 2021-02-27 DIAGNOSIS — I714 Abdominal aortic aneurysm, without rupture, unspecified: Secondary | ICD-10-CM

## 2021-02-27 NOTE — Progress Notes (Signed)
Office Note     CC:  follow up Requesting Provider:  [name], MD  HPI: Noah Mahoney is a 86 y.o. (1935/01/12) male who presents routine surveillance of abdominal aortic aneurysm.  The patient was last evaluated in November 2021.  His last aorta ultrasound was on 12/04/2016 with maximal diameter 4.0 cm.  He denies episodes of back or abdominal pain. Denies lower extremity claudication. He has lower extremity edema and wears compression socks. He ambulates with a rolling walker.  He states he had right renal mass followed by Dr. Hazle Nordmann with CT.  He is compliant with statin for hyperlipidemia.  He states his hypertension and blood glucose are well controlled.  He takes Eliquis for history of arrhythmia. He has a history of multiple drug eluting coronary stent placement. He quit ciagettes in 1987  Past Medical History:  Diagnosis Date   AAA (abdominal aortic aneurysm) without rupture (HCC)    Coronary artery disease    a. s/p CABG 1987;  b. s/p DES x 2 to SVG in 2005; c. s/p DES x 2 to SVG in 2007;  d. 2 DES placed to SVG and OM1 in 2010 ;  e. s/p PTCA mid body of SVG to OM1/OM2 due to ISR 08/13/12;  f. 10/2012 NSTEMI/Cath/PCI: native 3vd, VG->OM 99isr (3.0x16 Promus DES), LIMA->LAD nl, RIMA->RCA nl, EF 50%.   Diabetes mellitus, type 2 (HCC)    Dyslipidemia (high LDL; low HDL)    Gout    Hyperlipidemia    Hypertension    Myositis    Osteoarthritis    Osteoporosis    Paroxysmal supraventricular tachycardia ([place])     Past Surgical History:  Procedure Laterality Date   CARDIAC SURGERY     CARDIOVERSION N/A 12/19/2017   Procedure: CARDIOVERSION;  Surgeon: Jerline Pain, MD;  Location: [place];  Service: Cardiovascular;  Laterality: N/A;   CORONARY STENT PLACEMENT     EYE SURGERY Right March 2016   Cataract   LEFT HEART CATHETERIZATION WITH CORONARY ANGIOGRAM N/A 08/13/2012   Procedure: LEFT HEART CATHETERIZATION WITH CORONARY ANGIOGRAM;  Surgeon: Burnell Blanks, MD;  Location: [facility] CATH LAB;  Service: Cardiovascular;  Laterality: N/A;   LEFT HEART CATHETERIZATION WITH CORONARY/GRAFT ANGIOGRAM N/A 11/19/2012   Procedure: LEFT HEART CATHETERIZATION WITH [name];  Surgeon: [name], MD;  Location: [facility] CATH LAB;  Service: Cardiovascular;  Laterality: N/A;   PROSTATE SURGERY      Social History   Socioeconomic History   Marital status: Married    Spouse name: Not on file   Number of children: Not on file   Years of education: Not on file   Highest education level: Not on file  Occupational History   Not on file  Tobacco Use   Smoking status: Former    Types: Cigarettes    Quit date: 10/21/1985    Years since quitting: 35.3   Smokeless tobacco: Never  Substance and Sexual Activity   Alcohol use: No   Drug use: No   Sexual activity: Not on file  Other Topics Concern   Not on file  Social History Narrative   Not on file   Social Determinants of Health   Financial Resource Strain: Not on file  Food Insecurity: Not on file  Transportation Needs: Not on file  Physical Activity: Not on file  Stress: Not on file  Social Connections: Not on file  Intimate Partner Violence: Not on file   Family History  Problem Relation  Age of Onset   Thyroid disease Mother    Hypertension Father    Aneurysm Sister     Current Outpatient Medications  Medication Sig Dispense Refill   amiodarone (PACERONE) 200 MG tablet TAKE 1 TABLET BY MOUTH EVERY DAY 90 tablet 3   apixaban (ELIQUIS) 5 MG TABS tablet Take 1/2 tablet ( 2.5 mg ) twice a day 60 tablet 6   carvedilol (COREG) 3.125 MG tablet Take 1 tablet (3.125 mg total) by mouth 2 (two) times daily with a meal. 60 tablet 0   ELIQUIS 2.5 MG TABS tablet TAKE 1 TABLET BY MOUTH TWICE A DAY 60 tablet 5   ferrous sulfate 325 (65 FE) MG tablet Take by mouth.     furosemide (LASIX) 20 MG tablet TAKE 1 TABLET BY MOUTH EVERY DAY 90 tablet 3   glimepiride (AMARYL) 4 MG tablet Take 2 mg  by mouth daily.      Glucosamine Sulfate 1500 MG PACK Take by mouth.     isosorbide mononitrate (IMDUR) 30 MG 24 hr tablet Take 1 tablet (30 mg total) by mouth daily. 30 tablet 0   lisinopril (ZESTRIL) 10 MG tablet TAKE 1 TABLET BY MOUTH EVERY DAY 90 tablet 3   loratadine (CLARITIN) 10 MG tablet Take 10 mg by mouth at bedtime.     Misc Natural Products (OSTEO BI-FLEX/5-LOXIN ADVANCED) TABS Take 2 tablets by mouth daily.     nitroGLYCERIN (NITROSTAT) 0.4 MG SL tablet Place 1 tablet (0.4 mg total) under the tongue every 5 (five) minutes as needed. For chest pain 25 tablet 3   Omega-3 Fatty Acids (FISH OIL PO) Take 2 capsules by mouth daily.      potassium chloride (KLOR-CON) 10 MEQ tablet TAKE 2 TABLET BY MOUTH IN THE MORNING AND 1 TABLET IN THE EVENING 270 tablet 3   rosuvastatin (CRESTOR) 10 MG tablet TAKE 1 TABLET BY MOUTH EVERY DAY 90 tablet 0   spironolactone (ALDACTONE) 25 MG tablet TAKE 1 TABLET BY MOUTH EVERY DAY 90 tablet 1   No current facility-administered medications for this visit.    Allergies  Allergen Reactions   Lipitor [Atorvastatin] Other (See Comments)    myalgias   Metformin Other (See Comments)    unknown   Morphine And Related Itching   Pravastatin Sodium Other (See Comments)    myalgias   Niaspan [Niacin Er] Rash     REVIEW OF SYSTEMS:   '[X]'$  denotes positive finding, '[ ]'$  denotes negative finding Cardiac  Comments:  Chest pain or chest pressure:    Shortness of breath upon exertion:    Short of breath when lying flat:    Irregular heart rhythm:        Vascular    Pain in calf, thigh, or hip brought on by ambulation:    Pain in feet at night that wakes you up from your sleep:     Blood clot in your veins:    Leg swelling:  x       Pulmonary    Oxygen at home:    Productive cough:     Wheezing:         Neurologic    Sudden weakness in arms or legs:     Sudden numbness in arms or legs:     Sudden onset of difficulty speaking or slurred speech:     Temporary loss of vision in one eye:     Problems with dizziness:         Gastrointestinal  Blood in stool:     Vomited blood:         Genitourinary    Burning when urinating:     Blood in urine:        Psychiatric    Major depression:         Hematologic    Bleeding problems:    Problems with blood clotting too easily:        Skin    Rashes or ulcers:        Constitutional    Fever or chills:      PHYSICAL EXAMINATION: Vitals:   02/27/21 0943  BP: (!) 146/59  Pulse: (!) 45  Resp: 18  Temp: (!) 97.3 F (36.3 C)  SpO2: 97%     General:  WDWN in NAD; vital signs documented above Gait: independent with RW HENT: WNL, normocephalic Pulmonary: normal non-labored breathing , without Rales, rhonchi,  wheezing Cardiac: regular HR,  Abdomen: soft, NT, no masses Skin: without rashes Vascular Exam/Pulses: Extremities: + lower extremity edema without ischemic changes, without Gangrene , without cellulitis; without open wounds;  Musculoskeletal: no muscle wasting or atrophy  Neurologic: A&O X 3;  No focal weakness or paresthesias are detected Psychiatric:  The pt has Normal affect.   Non-Invasive Vascular Imaging:   02/27/2021  Abdominal Aorta Findings:  +-----------+-------+----------+----------+--------+--------+--------+  Location   AP (cm)Trans (cm)PSV (cm/s)WaveformThrombusComments  +-----------+-------+----------+----------+--------+--------+--------+  Proximal   2.94   2.64      66                                  +-----------+-------+----------+----------+--------+--------+--------+  Mid        2.85   3.11      135                                 +-----------+-------+----------+----------+--------+--------+--------+  Distal     3.26   3.05      128                                 +-----------+-------+----------+----------+--------+--------+--------+  RT CIA Prox1.0    1.1       117                                  +-----------+-------+----------+----------+--------+--------+--------+  LT CIA Prox1.1    1.2       144                                 +-----------+-------+----------+----------+--------+--------+--------+  Summary:  Abdominal Aorta: There is evidence of abnormal dilatation of the distal  Abdominal aorta. The largest aortic diameter remains essentially unchanged  compared to prior exam.     *See table(s) above for measurements and observations.      Preliminary     ASSESSMENT/PLAN:: 86 y.o. male here for follow up for AAA. Maximal aorta diameter 3.62 cm today. No symptoms referable to AAA. Continue aspirin and statin, smoking cessation. Advised regarding good control of BP and glucose; exercise program; proceed to ED should he develop severe or persistent back or abdominal pain.  Follow-up in one year with aorta duplex. He states he had right renal mass  followed by Dr. Hazle Nordmann with CT.   Barbie Banner, PA-C [facility] [phone]  Clinic MD:   Trula Slade

## 2021-02-28 ENCOUNTER — Encounter: Payer: Self-pay | Admitting: Cardiovascular Disease

## 2021-02-28 ENCOUNTER — Ambulatory Visit: Payer: Medicare HMO | Admitting: Cardiovascular Disease

## 2021-02-28 VITALS — BP 124/78 | HR 45 | Ht 73.0 in | Wt 247.0 lb

## 2021-02-28 DIAGNOSIS — I4819 Other persistent atrial fibrillation: Secondary | ICD-10-CM | POA: Diagnosis not present

## 2021-02-28 DIAGNOSIS — I5022 Chronic systolic (congestive) heart failure: Secondary | ICD-10-CM

## 2021-02-28 DIAGNOSIS — E785 Hyperlipidemia, unspecified: Secondary | ICD-10-CM

## 2021-02-28 DIAGNOSIS — I872 Venous insufficiency (chronic) (peripheral): Secondary | ICD-10-CM

## 2021-02-28 DIAGNOSIS — I2510 Atherosclerotic heart disease of native coronary artery without angina pectoris: Secondary | ICD-10-CM | POA: Diagnosis not present

## 2021-02-28 NOTE — Patient Instructions (Signed)
Medication Instructions:  STOP the Carvedilol  *If you need a refill on your cardiac medications before your next appointment, please call your pharmacy*   Lab Work: None ordered If you have labs (blood work) drawn today and your tests are completely normal, you will receive your results only by: [place] (if you have MyChart) OR A paper copy in the mail If you have any lab test that is abnormal or we need to change your treatment, we will call you to review the results.   Testing/Procedures: None ordered   Follow-Up: At [facility], you and your health needs are our priority.  As part of our continuing mission to provide you with exceptional heart care, we have created designated Provider Care Teams.  These Care Teams include your primary Cardiologist (physician) and Advanced Practice Providers (APPs -  Physician Assistants and Nurse Practitioners) who all work together to provide you with the care you need, when you need it.  We recommend signing up for the patient portal called "MyChart".  Sign up information is provided on this After Visit Summary.  MyChart is used to connect with patients for Virtual Visits (Telemedicine).  Patients are able to view lab/test results, encounter notes, upcoming appointments, etc.  Non-urgent messages can be sent to your provider as well.   To learn more about what you can do with MyChart, go to [email or web address].    Your next appointment:   6 month(s)  The format for your next appointment:   In Person  Provider:   [name], MD

## 2021-02-28 NOTE — Progress Notes (Signed)
Cardiology Office Note   Date:  02/28/2021   ID:  Noah Mahoney, DOB [birth year], MRN [id number]  PCP:  Noah Mina., MD  Cardiologist:   Noah Sacramento, MD   No chief complaint on file.     History of Present Illness: Noah Mahoney is a 86 y.o. male who presents for a follow-up visit regarding coronary artery disease , persistent atrial fibrillation and chronic systolic heart failure. He is status post coronary artery bypass grafting in 1987 with multiple interventions on the vein graft to the obtuse marginal.  Most recent cardiac catheterization in April of 2014 showed severe native three-vessel coronary artery disease with patent LIMA to LAD and RIMA to RCA. SVG to OM was patent but with 99% in-stent restenosis at the site of previous [place] angioplasty. He underwent angioplasty and Promus drug-eluting stent placement.  Anginal symptoms are usually manifested by left shoulder pain.  Other medical issues include medium size AAA followed annually by [facility], left bundle branch block, type 2 diabetes, GERD and arthritis.  He was hospitalized in 2019 for atrial fibrillation complicated by heart failure with a drop in ejection fraction to 25%.  He underwent successful cardioversion after loading with amiodarone. Repeat echocardiogram in February 2020 showed improvement in ejection fraction to 45 to 50%.  He has chronic venous insufficiency with recurrent ulceration.  This is being managed by Dr. Oneida Mahoney.    He has been doing well with no recent chest pain, shortness of breath or palpitations.  He was noted to be more bradycardic today although he denies dizziness, syncope or presyncope.   Past Medical History:  Diagnosis Date   AAA (abdominal aortic aneurysm) without rupture (HCC)    Coronary artery disease    a. s/p CABG 1987;  b. s/p DES x 2 to SVG in 2005; c. s/p DES x 2 to SVG in 2007;  d. 2 DES placed to SVG and OM1 in 2010 ;  e. s/p PTCA mid body of SVG to OM1/OM2 due  to ISR 08/13/12;  f. 10/2012 NSTEMI/Cath/PCI: native 3vd, VG->OM 99isr (3.0x16 Promus DES), LIMA->LAD nl, RIMA->RCA nl, EF 50%.   Diabetes mellitus, type 2 (HCC)    Dyslipidemia (high LDL; low HDL)    Gout    Hyperlipidemia    Hypertension    Myositis    Osteoarthritis    Osteoporosis    Paroxysmal supraventricular tachycardia ([place])     Past Surgical History:  Procedure Laterality Date   CARDIAC SURGERY     CARDIOVERSION N/A 12/19/2017   Procedure: CARDIOVERSION;  Surgeon: Jerline Pain, MD;  Location: [place];  Service: Cardiovascular;  Laterality: N/A;   CORONARY STENT PLACEMENT     EYE SURGERY Right March 2016   Cataract   LEFT HEART CATHETERIZATION WITH CORONARY ANGIOGRAM N/A 08/13/2012   Procedure: LEFT HEART CATHETERIZATION WITH CORONARY ANGIOGRAM;  Surgeon: Burnell Blanks, MD;  [facility] C[facility] LAB;  Service: Cardiovascular;  Laterality: N/A;   LEFT HEART CATHETERIZATION WITH CORONARY/GRAFT ANGIOGRAM N/A 11/19/2012   Procedure: LEFT HEART CATHETERIZATION WITH [name];  Surgeon: [name], MD;  Location: [place] CATH LAB;  Service: Cardiovascular;  Laterality: N/A;   PROSTATE SURGERY       Current Outpatient Medications  Medication Sig Dispense Refill   amiodarone (PACERONE) 200 MG tablet TAKE 1 TABLET BY MOUTH EVERY DAY 90 tablet 3   apixaban (ELIQUIS) 5 MG TABS tablet Take 1/2 tablet ( 2.5 mg ) twice a day 60  tablet 6   carvedilol (COREG) 3.125 MG tablet Take 1 tablet (3.125 mg total) by mouth 2 (two) times daily with a meal. 60 tablet 0   ferrous sulfate 325 (65 FE) MG tablet Take by mouth.     furosemide (LASIX) 20 MG tablet TAKE 1 TABLET BY MOUTH EVERY DAY 90 tablet 3   glimepiride (AMARYL) 4 MG tablet Take 2 mg by mouth daily.     Glucosamine Sulfate 1500 MG PACK Take by mouth.     isosorbide mononitrate (IMDUR) 30 MG 24 hr tablet Take 1 tablet (30 mg total) by mouth daily. 30 tablet 0   lisinopril (ZESTRIL) 5 MG tablet Take 5 mg by mouth  daily.     loratadine (CLARITIN) 10 MG tablet Take by mouth.     Misc Natural Products (OSTEO BI-FLEX/5-LOXIN ADVANCED) TABS Take 2 tablets by mouth daily.     nitroGLYCERIN (NITROSTAT) 0.4 MG SL tablet Place 1 tablet (0.4 mg total) under the tongue every 5 (five) minutes as needed. For chest pain 25 tablet 3   Omega-3 Fatty Acids (FISH OIL PO) Take 2 capsules by mouth daily.      potassium chloride (KLOR-CON) 10 MEQ tablet TAKE 2 TABLET BY MOUTH IN THE MORNING AND 1 TABLET IN THE EVENING 270 tablet 3   potassium chloride SA (KLOR-CON) 20 MEQ tablet Take 1 tablet by mouth daily.     rosuvastatin (CRESTOR) 10 MG tablet TAKE 1 TABLET BY MOUTH EVERY DAY 90 tablet 0   spironolactone (ALDACTONE) 25 MG tablet TAKE 1 TABLET BY MOUTH EVERY DAY 90 tablet 1   No current facility-administered medications for this visit.    Allergies:   Lipitor [atorvastatin], Metformin, Morphine and related, Pravastatin sodium, and Niaspan [niacin er]    Social History:  The patient  reports that he quit smoking about 35 years ago. His smoking use included cigarettes. He has never used smokeless tobacco. He reports that he does not drink alcohol and does not use drugs.   Family History:  The patient's family history includes Aneurysm in his sister; Hypertension in his father; Thyroid disease in his mother.    ROS:  Please see the history of present illness.   Otherwise, review of systems are positive for none.   All other systems are reviewed and negative.    PHYSICAL EXAM: VS:  BP 124/78   Pulse (!) 45   Ht '6\' 1"'$  (1.854 m)   Wt 247 lb (112 kg)   BMI 32.59 kg/m  , BMI Body mass index is 32.59 kg/m. GEN: Well nourished, well developed, in no acute distress  HEENT: normal  Neck: no JVD, carotid bruits, or masses Cardiac: Regular rate and rhythm and mildly bradycardic; no murmurs, rubs, or gallops, mild bilateral leg edema Respiratory:  clear to auscultation bilaterally, normal work of breathing GI: soft,  nontender, nondistended, + BS MS: no deformity or atrophy  Skin: warm and dry, no rash Neuro:  Strength and sensation are intact Psych: euthymic mood, full affect   EKG:  EKG is ordered today. The ekg ordered today demonstrates sinus bradycardia with a heart rate of 45 bpm.  Left bundle branch block.  Recent Labs: No results found for requested labs within last 8760 hours.    Lipid Panel    Component Value Date/Time   CHOL 108 08/13/2012 0735   TRIG 127 08/13/2012 0735   HDL 25 (L) 08/13/2012 0735   CHOLHDL 4.3 08/13/2012 0735   VLDL 25 08/13/2012 0735  [place] 58 08/13/2012 0735      Wt Readings from Last 3 Encounters:  02/28/21 247 lb (112 kg)  02/27/21 229 lb (103.9 kg)  08/30/20 240 lb 6.4 oz (109 kg)        ASSESSMENT AND PLAN:  1.  Persistent atrial fibrillation: He is maintaining in sinus rhythm with amiodarone.  He seems to be more bradycardic than before.  Thus, I elected to discontinue small dose carvedilol.  Continue amiodarone to try to maintain sinus rhythm.   I reviewed his recent labs which showed stable creatinine at 1.6 and stable anemia with a hemoglobin of 12.7.  Recommend continuing Eliquis 2.5 mg twice daily.    2.  Chronic systolic heart failure: Most recent ejection fraction was 45 to 50%.  Currently on  lisinopril and spironolactone.  He seems to be euvolemic.  Carvedilol was discontinued today due to bradycardia.  3. Coronary artery disease involving bypass graft without angina: No antiplatelet therapy at the present time given that he is on Eliquis.  4. Hyperlipidemia: I reviewed his lipid profile which was done last month and showed an LDL of 62.  Based on this, we will continue rosuvastatin 10 mg daily.  5.  Chronic venous insufficiency with recurrent ulceration.  Continue support stockings.   Disposition:   FU with me in 6 months  Signed,  Noah Sacramento, MD  02/28/2021 11:34 AM    [place]

## 2021-07-12 ENCOUNTER — Other Ambulatory Visit: Payer: Self-pay | Admitting: Cardiovascular Disease

## 2021-07-12 NOTE — Telephone Encounter (Signed)
Please review

## 2021-07-12 NOTE — Telephone Encounter (Signed)
Eliquis 5 mg refill request received. Patient is 86 years old, weight- 112 kg, Crea-1.6 on 02/16/21, Diagnosis-afib, and last seen by Dr. Fletcher Anon on 02/28/21. Dose is appropriate based on dosing criteria. Will send in refill to requested pharmacy.

## 2021-07-15 ENCOUNTER — Other Ambulatory Visit: Payer: Self-pay | Admitting: Cardiovascular Disease

## 2021-08-16 ENCOUNTER — Other Ambulatory Visit: Payer: Self-pay | Admitting: Cardiovascular Disease

## 2021-08-16 DIAGNOSIS — D689 Coagulation defect, unspecified: Secondary | ICD-10-CM

## 2021-08-16 DIAGNOSIS — I4819 Other persistent atrial fibrillation: Secondary | ICD-10-CM

## 2021-08-16 DIAGNOSIS — R5383 Other fatigue: Secondary | ICD-10-CM

## 2021-08-16 DIAGNOSIS — Z01812 Encounter for preprocedural laboratory examination: Secondary | ICD-10-CM

## 2021-08-17 NOTE — Telephone Encounter (Signed)
Refill request

## 2021-10-17 ENCOUNTER — Other Ambulatory Visit: Payer: Self-pay

## 2021-10-17 ENCOUNTER — Ambulatory Visit: Payer: Medicare Other | Admitting: Cardiovascular Disease

## 2021-10-17 ENCOUNTER — Encounter: Payer: Self-pay | Admitting: Cardiovascular Disease

## 2021-10-17 VITALS — BP 128/72 | HR 60 | Ht 73.0 in | Wt 228.2 lb

## 2021-10-17 DIAGNOSIS — I5022 Chronic systolic (congestive) heart failure: Secondary | ICD-10-CM

## 2021-10-17 DIAGNOSIS — I2510 Atherosclerotic heart disease of native coronary artery without angina pectoris: Secondary | ICD-10-CM | POA: Diagnosis not present

## 2021-10-17 DIAGNOSIS — I872 Venous insufficiency (chronic) (peripheral): Secondary | ICD-10-CM

## 2021-10-17 DIAGNOSIS — I4819 Other persistent atrial fibrillation: Secondary | ICD-10-CM

## 2021-10-17 DIAGNOSIS — E785 Hyperlipidemia, unspecified: Secondary | ICD-10-CM

## 2021-10-17 NOTE — Progress Notes (Signed)
?  ?Cardiology Office Note ? ? ?Date:  10/17/2021  ? ?ID:  Noah Mahoney, DOB August 16, 1934, MRN [id number] ? ?PCP:  [name], MD  ?Cardiologist:   [name], MD  ? ?Chief Complaint  ?Patient presents with  ? Follow-up  ? ? ? ?  ?History of Present Illness: ?Noah Mahoney is a 87 y.o. male who presents for a follow-up visit regarding coronary artery disease , persistent atrial fibrillation and chronic systolic heart failure. ?He is status post coronary artery bypass grafting in 1987 with multiple interventions on the vein graft to the obtuse marginal.  ?Most recent cardiac catheterization in April of 2014 showed severe native three-vessel coronary artery disease with patent LIMA to LAD and RIMA to RCA. SVG to OM was patent but with 99% in-stent restenosis at the site of previous Cutting Balloon angioplasty. He underwent angioplasty and Promus drug-eluting stent placement.  ?Anginal symptoms are usually manifested by left shoulder pain.  ?Other medical issues include medium size AAA followed annually by [facility], left bundle branch block, type 2 diabetes, GERD and arthritis.  ?He was hospitalized in 2019 for atrial fibrillation complicated by heart failure with a drop in ejection fraction to 25%.  He underwent successful cardioversion after loading with amiodarone. ?Repeat echocardiogram in February 2020 showed improvement in ejection fraction to 45 to 50%. ? ?Due to persistent bradycardia, carvedilol was discontinued during last visit.  His heart rate was in the 40s and now it is in the low 60s.  He has been doing well with no chest pain or worsening dyspnea.  No dizziness or palpitations. ? ?Past Medical History:  ?Diagnosis Date  ? AAA (abdominal aortic aneurysm) without rupture   ? Coronary artery disease   ? a. s/p CABG 1987;  b. s/p DES x 2 to SVG in 2005; c. s/p DES x 2 to SVG in 2007;  d. 2 DES placed to SVG and OM1 in 2010 ;  e. s/p PTCA mid body of SVG to OM1/OM2 due to ISR 08/13/12;  f. 10/2012  NSTEMI/Cath/PCI: native 3vd, VG->OM 99isr (3.0x16 Promus DES), LIMA->LAD nl, RIMA->RCA nl, EF 50%.  ? Diabetes mellitus, type 2 ([place])   ? Dyslipidemia (high LDL; low HDL)   ? Gout   ? Hyperlipidemia   ? Hypertension   ? Myositis   ? Osteoarthritis   ? Osteoporosis   ? Paroxysmal supraventricular tachycardia ([place])   ? ? ?Past Surgical History:  ?Procedure Laterality Date  ? CARDIAC SURGERY    ? CARDIOVERSION N/A 12/19/2017  ? Procedure: CARDIOVERSION;  Surgeon: Jerline Pain, MD;  Location: [facility] ENDOSCOPY;  Service: Cardiovascular;  Laterality: N/A;  ? CORONARY STENT PLACEMENT    ? EYE SURGERY Right March 2016  ? Cataract  ? LEFT HEART CATHETERIZATION WITH CORONARY ANGIOGRAM N/A 08/13/2012  ? Procedure: LEFT HEART CATHETERIZATION WITH CORONARY ANGIOGRAM;  Surgeon: Burnell Blanks, MD;  Location: [facility] CATH LAB;  Service: Cardiovascular;  Laterality: N/A;  ? LEFT HEART CATHETERIZATION WITH CORONARY/GRAFT ANGIOGRAM N/A 11/19/2012  ? Procedure: LEFT HEART CATHETERIZATION WITH [name];  Surgeon: [name], MD;  Location: [facility] CATH LAB;  Service: Cardiovascular;  Laterality: N/A;  ? PROSTATE SURGERY    ? ? ? ?Current Outpatient Medications  ?Medication Sig Dispense Refill  ? amiodarone (PACERONE) 200 MG tablet TAKE 1 TABLET BY MOUTH EVERY DAY 90 tablet 1  ? apixaban (ELIQUIS) 5 MG TABS tablet Take 1/2 tablet ( 2.5 mg ) twice a day 60 tablet 6  ?  ELIQUIS 2.5 MG TABS tablet TAKE 1 TABLET BY MOUTH TWICE A DAY 60 tablet 5  ? ferrous sulfate 325 (65 FE) MG tablet Take by mouth.    ? furosemide (LASIX) 20 MG tablet TAKE 1 TABLET BY MOUTH EVERY DAY 90 tablet 3  ? Glucosamine Sulfate 1500 MG PACK Take by mouth.    ? isosorbide mononitrate (IMDUR) 30 MG 24 hr tablet Take 1 tablet (30 mg total) by mouth daily. 30 tablet 0  ? lisinopril (ZESTRIL) 5 MG tablet Take 5 mg by mouth daily.    ? loratadine (CLARITIN) 10 MG tablet Take by mouth.    ? Misc Natural Products (OSTEO BI-FLEX/5-LOXIN ADVANCED) TABS Take 2  tablets by mouth daily.    ? nitroGLYCERIN (NITROSTAT) 0.4 MG SL tablet Place 1 tablet (0.4 mg total) under the tongue every 5 (five) minutes as needed. For chest pain 25 tablet 3  ? Omega-3 Fatty Acids (FISH OIL PO) Take 2 capsules by mouth daily.     ? potassium chloride (KLOR-CON) 10 MEQ tablet TAKE 2 TABLET BY MOUTH IN THE MORNING AND 1 TABLET IN THE EVENING 270 tablet 3  ? potassium chloride SA (KLOR-CON) 20 MEQ tablet Take 1 tablet by mouth daily.    ? rosuvastatin (CRESTOR) 10 MG tablet TAKE 1 TABLET BY MOUTH EVERY DAY 90 tablet 0  ? sitaGLIPtin (JANUVIA) 25 MG tablet Take 1 tablet by mouth daily.    ? spironolactone (ALDACTONE) 25 MG tablet TAKE 1 TABLET BY MOUTH EVERY DAY 90 tablet 1  ? ?No current facility-administered medications for this visit.  ? ? ?Allergies:   Lipitor [atorvastatin], Metformin, Morphine and related, Pravastatin sodium, and Niaspan [niacin er]  ? ? ?Social History:  The patient  reports that he quit smoking about 36 years ago. His smoking use included cigarettes. He has never used smokeless tobacco. He reports that he does not drink alcohol and does not use drugs.  ? ?Family History:  The patient's family history includes Aneurysm in his sister; Hypertension in his father; Thyroid disease in his mother.  ? ? ?ROS:  Please see the history of present illness.   Otherwise, review of systems are positive for none.   All other systems are reviewed and negative.  ? ? ?PHYSICAL EXAM: ?VS:  BP 128/72   Pulse 60   Ht '6\' 1"'$  (1.854 m)   Wt 228 lb 3.2 oz (103.5 kg)   SpO2 96%   BMI 30.11 kg/m?  , BMI Body mass index is 30.11 kg/m?. ?GEN: Well nourished, well developed, in no acute distress  ?HEENT: normal  ?Neck: no JVD, carotid bruits, or masses ?Cardiac: Regular rate and rhythm; no murmurs, rubs, or gallops, mild bilateral leg edema ?Respiratory:  clear to auscultation bilaterally, normal work of breathing ?GI: soft, nontender, nondistended, + BS ?MS: no deformity or atrophy  ?Skin: warm  and dry, no rash ?Neuro:  Strength and sensation are intact ?Psych: euthymic mood, full affect ? ? ?EKG:  EKG is ordered today. ?The ekg ordered today demonstrates normal sinus rhythm with left bundle branch block.  Heart rate is 60 bpm. ? ? ?Recent Labs: ?No results found for requested labs within last 8760 hours.  ? ? ?Lipid Panel ?   ?Component Value Date/Time  ? CHOL 108 08/13/2012 0735  ? TRIG 127 08/13/2012 0735  ? HDL 25 (L) 08/13/2012 0735  ? CHOLHDL 4.3 08/13/2012 0735  ? VLDL 25 08/13/2012 0735  ? [place] 58 08/13/2012 0735  ? ?  ? ?  Wt Readings from Last 3 Encounters:  ?10/17/21 228 lb 3.2 oz (103.5 kg)  ?02/28/21 247 lb (112 kg)  ?02/27/21 229 lb (103.9 kg)  ?  ? ? ? ? ?ASSESSMENT AND PLAN: ? ?1.  Persistent atrial fibrillation: He is maintaining in sinus rhythm with amiodarone.  Bradycardia improved after stopping small dose carvedilol.  I reviewed most recent labs done in January which showed stable creatinine at 1.52 with normal liver and thyroid profile.  Continue anticoagulation with low-dose Eliquis 2.5 mg twice daily. ? ?2.  Chronic systolic heart failure: Most recent ejection fraction was 45 to 50%.  Currently on  lisinopril and spironolactone.  He seems to be euvolemic.   ? ?3. Coronary artery disease involving bypass graft without angina: No antiplatelet therapy at the present time given that he is on Eliquis. ? ?4. Hyperlipidemia: I reviewed most recent lipid profile done in January which showed an LDL of 70.  Continue rosuvastatin 10 mg daily. ? ?5.  Chronic venous insufficiency with recurrent ulceration.  Continue support stockings. ? ? ?Disposition:   FU with me in 6 months ? ?Signed, ? ?[name], MD  ?10/17/2021 9:29 AM    ?[facility] ?

## 2021-10-17 NOTE — Patient Instructions (Signed)

## 2022-01-06 ENCOUNTER — Other Ambulatory Visit: Payer: Self-pay | Admitting: Cardiovascular Disease

## 2022-01-08 NOTE — Telephone Encounter (Signed)
Refill request

## 2022-01-08 NOTE — Telephone Encounter (Signed)
Prescription refill request for Eliquis received. Indication: Atrial Fib Last office visit: 10/17/21  Rod Can MD Scr: 1.52 on 08/29/21 Age: 88 Weight: 103.5kg  Based on above findings Eliquis 2.'5mg'$  twice daily is the appropriate dose.  Refill approved.

## 2022-01-18 ENCOUNTER — Other Ambulatory Visit: Payer: Self-pay | Admitting: Cardiovascular Disease

## 2022-02-12 ENCOUNTER — Other Ambulatory Visit: Payer: Self-pay | Admitting: Cardiovascular Disease

## 2022-03-06 ENCOUNTER — Telehealth: Payer: Self-pay | Admitting: Cardiovascular Disease

## 2022-03-06 NOTE — Telephone Encounter (Signed)
Patient states he needs to know his compression level needed for socks. Please advise.

## 2022-03-06 NOTE — Telephone Encounter (Signed)
Returned call to patient, patient reports Dr. Oneida Alar office ordering compression stockings and he is unsure of the compression level needed.    Advised typically recommend 15-20 but would call [facility] to confirm (nothing noted in OV).   # provided for patient to call-he is due for follow up with them as well.

## 2022-05-08 ENCOUNTER — Ambulatory Visit: Payer: Medicare Other | Admitting: Cardiovascular Disease

## 2022-05-09 ENCOUNTER — Other Ambulatory Visit: Payer: Self-pay | Admitting: Cardiovascular Disease

## 2022-05-09 DIAGNOSIS — Z01812 Encounter for preprocedural laboratory examination: Secondary | ICD-10-CM

## 2022-05-09 DIAGNOSIS — R5383 Other fatigue: Secondary | ICD-10-CM

## 2022-05-09 DIAGNOSIS — I4819 Other persistent atrial fibrillation: Secondary | ICD-10-CM

## 2022-05-09 DIAGNOSIS — D689 Coagulation defect, unspecified: Secondary | ICD-10-CM

## 2022-05-09 NOTE — Telephone Encounter (Signed)
Refill request

## 2022-05-10 ENCOUNTER — Telehealth: Payer: Self-pay | Admitting: Cardiovascular Disease

## 2022-05-10 ENCOUNTER — Other Ambulatory Visit: Payer: Self-pay

## 2022-05-10 NOTE — Telephone Encounter (Signed)
Pt c/o medication issue:  1. Name of Medication: spironolactone (ALDACTONE) 25 MG tablet  2. How are you currently taking this medication (dosage and times per day)?   3. Are you having a reaction (difficulty breathing--STAT)?   4. What is your medication issue? Pt wants to know should he still be taking this medication, pt had some other questions about kidneys

## 2022-05-10 NOTE — Telephone Encounter (Signed)
Spoke to patient he stated he wanted Dr.Arida to know he had right kidney removed 03/07/22 due to cancer.Stated he was told to stop taking Lisinopril,Spironolactone,and Klor Con.Stated he is doing well.He has a follow up appointment with Dr.Arida 11/7 at 10:40 am.Advised I will make [place] aware.

## 2022-05-14 NOTE — Telephone Encounter (Signed)
Okay, that is fine.  He should try to check his blood pressure daily over the next week to ensure stability.

## 2022-05-14 NOTE — Telephone Encounter (Signed)
Spoke to patient Dr.Arida's advice given.Stated B/P has been normal.He will check B/P daily and bring readings to 11/7 appointment.

## 2022-06-05 ENCOUNTER — Encounter: Payer: Self-pay | Admitting: Cardiovascular Disease

## 2022-06-05 ENCOUNTER — Ambulatory Visit: Payer: Medicare Other | Attending: Cardiovascular Disease | Admitting: Cardiovascular Disease

## 2022-06-05 VITALS — BP 120/72 | HR 62 | Ht 72.0 in | Wt 205.6 lb

## 2022-06-05 DIAGNOSIS — I2510 Atherosclerotic heart disease of native coronary artery without angina pectoris: Secondary | ICD-10-CM

## 2022-06-05 DIAGNOSIS — E785 Hyperlipidemia, unspecified: Secondary | ICD-10-CM | POA: Diagnosis not present

## 2022-06-05 DIAGNOSIS — I5022 Chronic systolic (congestive) heart failure: Secondary | ICD-10-CM | POA: Diagnosis not present

## 2022-06-05 DIAGNOSIS — I4819 Other persistent atrial fibrillation: Secondary | ICD-10-CM | POA: Diagnosis not present

## 2022-06-05 DIAGNOSIS — I872 Venous insufficiency (chronic) (peripheral): Secondary | ICD-10-CM

## 2022-06-05 MED ORDER — HYDRALAZINE HCL 25 MG PO TABS: 25.0000 mg | ORAL_TABLET | Freq: Two times a day (BID) | ORAL | 1 refills | Status: DC

## 2022-06-05 MED ORDER — NITROGLYCERIN 0.4 MG SL SUBL: 0.4000 mg | SUBLINGUAL_TABLET | SUBLINGUAL | 3 refills | Status: AC | PRN

## 2022-06-05 NOTE — Patient Instructions (Signed)
Medication Instructions:  START Hydralazine 25 mg twice daily  *If you need a refill on your cardiac medications before your next appointment, please call your pharmacy*   Lab Work: None ordered If you have labs (blood work) drawn today and your tests are completely normal, you will receive your results only by: [place] (if you have MyChart) OR A paper copy in the mail If you have any lab test that is abnormal or we need to change your treatment, we will call you to review the results.   Testing/Procedures: None ordered   Follow-Up: At [facility], you and your health needs are our priority.  As part of our continuing mission to provide you with exceptional heart care, we have created designated Provider Care Teams.  These Care Teams include your primary Cardiologist (physician) and Advanced Practice Providers (APPs -  Physician Assistants and Nurse Practitioners) who all work together to provide you with the care you need, when you need it.  We recommend signing up for the patient portal called "MyChart".  Sign up information is provided on this After Visit Summary.  MyChart is used to connect with patients for Virtual Visits (Telemedicine).  Patients are able to view lab/test results, encounter notes, upcoming appointments, etc.  Non-urgent messages can be sent to your provider as well.   To learn more about what you can do with MyChart, go to [email or web address].    Your next appointment:   6 month(s)  The format for your next appointment:   In Person  Provider:   [name], MD      Important Information About Sugar

## 2022-06-05 NOTE — Progress Notes (Signed)
Cardiology Office Note   Date:  06/05/2022   ID:  Noah Mahoney, DOB March 20, 1935, MRN [id number]  PCP:  [name], MD  Cardiologist:   [name], MD   No chief complaint on file.      History of Present Illness: Noah Mahoney is a 87 y.o. male who presents for a follow-up visit regarding coronary artery disease , persistent atrial fibrillation and chronic systolic heart failure. He is status post coronary artery bypass grafting in 1987 with multiple interventions on the vein graft to the obtuse marginal.  Most recent cardiac catheterization in April of 2014 showed severe native three-vessel coronary artery disease with patent LIMA to LAD and RIMA to RCA. SVG to OM was patent but with 99% in-stent restenosis at the site of previous Cutting Balloon angioplasty. He underwent angioplasty and Promus drug-eluting stent placement.  Anginal symptoms are usually manifested by left shoulder pain.  Other medical issues include medium size AAA followed annually by [facility], left bundle branch block, type 2 diabetes, GERD and arthritis.  He was hospitalized in 2019 for atrial fibrillation complicated by heart failure with a drop in ejection fraction to 25%.  He underwent successful cardioversion after loading with amiodarone. Repeat echocardiogram in February 2020 showed improvement in ejection fraction to 45 to 50%.  Due to persistent bradycardia, carvedilol was discontinued.  He underwent robotic assisted right radical nephrectomy in August of this year due to renal cell carcinoma.  His lisinopril and spironolactone were discontinued after the surgery.  His blood pressure increased after that but subsequently improved.  He denies chest pain or worsening dyspnea.  His labs did show elevated liver enzymes of unclear etiology.  Past Medical History:  Diagnosis Date   AAA (abdominal aortic aneurysm) without rupture ([facility])    Coronary artery disease    a. s/p CABG 1987;  b. s/p DES x 2 to  SVG in 2005; c. s/p DES x 2 to SVG in 2007;  d. 2 DES placed to SVG and OM1 in 2010 ;  e. s/p PTCA mid body of SVG to OM1/OM2 due to ISR 08/13/12;  f. 10/2012 NSTEMI/Cath/PCI: native 3vd, VG->OM 99isr (3.0x16 Promus DES), LIMA->LAD nl, RIMA->RCA nl, EF 50%.   Diabetes mellitus, type 2 ([facility])    Dyslipidemia (high LDL; low HDL)    Gout    Hyperlipidemia    Hypertension    Myositis    Osteoarthritis    Osteoporosis    Paroxysmal supraventricular tachycardia     Past Surgical History:  Procedure Laterality Date   CARDIAC SURGERY     CARDIOVERSION N/A 12/19/2017   Procedure: CARDIOVERSION;  Surgeon: Jerline Pain, MD;  Location: [facility];  Service: Cardiovascular;  Laterality: N/A;   CORONARY STENT PLACEMENT     EYE SURGERY Right March 2016   Cataract   LEFT HEART CATHETERIZATION WITH CORONARY ANGIOGRAM N/A 08/13/2012   Procedure: LEFT HEART CATHETERIZATION WITH CORONARY ANGIOGRAM;  Surgeon: Burnell Blanks, MD;  Location: [facility] CATH LAB;  Service: Cardiovascular;  Laterality: N/A;   LEFT HEART CATHETERIZATION WITH CORONARY/GRAFT ANGIOGRAM N/A 11/19/2012   Procedure: LEFT HEART CATHETERIZATION WITH [name];  Surgeon: [name], MD;  Location: [facility] CATH LAB;  Service: Cardiovascular;  Laterality: N/A;   PROSTATE SURGERY       Current Outpatient Medications  Medication Sig Dispense Refill   amiodarone (PACERONE) 200 MG tablet TAKE 1 TABLET BY MOUTH EVERY DAY 90 tablet 0   ELIQUIS 2.5 MG TABS  tablet TAKE 1 TABLET BY MOUTH TWICE A DAY 60 tablet 5   ferrous sulfate 325 (65 FE) MG tablet Take by mouth.     furosemide (LASIX) 20 MG tablet Take 20 mg by mouth daily as needed.     Glucosamine Sulfate 1500 MG PACK Take by mouth.     isosorbide mononitrate (IMDUR) 30 MG 24 hr tablet Take 1 tablet (30 mg total) by mouth daily. 30 tablet 0   loratadine (CLARITIN) 10 MG tablet Take by mouth.     Misc Natural Products (OSTEO BI-FLEX/5-LOXIN ADVANCED) TABS Take 2 tablets by  mouth daily.     Omega-3 Fatty Acids (FISH OIL PO) Take 2 capsules by mouth daily.      rosuvastatin (CRESTOR) 10 MG tablet TAKE 1 TABLET BY MOUTH EVERY DAY 90 tablet 0   furosemide (LASIX) 20 MG tablet TAKE 1 TABLET BY MOUTH EVERY DAY (Patient not taking: Reported on 06/05/2022) 90 tablet 3   nitroGLYCERIN (NITROSTAT) 0.4 MG SL tablet Place 1 tablet (0.4 mg total) under the tongue every 5 (five) minutes as needed. For chest pain (Patient not taking: Reported on 06/05/2022) 25 tablet 3   sitaGLIPtin (JANUVIA) 25 MG tablet Take 1 tablet by mouth daily. (Patient not taking: Reported on 06/05/2022)     No current facility-administered medications for this visit.    Allergies:   Lipitor [atorvastatin], Metformin, Morphine and related, Pravastatin sodium, and Niaspan [niacin er]    Social History:  The patient  reports that he quit smoking about 36 years ago. His smoking use included cigarettes. He has never used smokeless tobacco. He reports that he does not drink alcohol and does not use drugs.   Family History:  The patient's family history includes Aneurysm in his sister; Hypertension in his father; Thyroid disease in his mother.    ROS:  Please see the history of present illness.   Otherwise, review of systems are positive for none.   All other systems are reviewed and negative.    PHYSICAL EXAM: VS:  BP 120/72   Pulse 62   Ht 6' (1.829 m)   Wt 205 lb 9.6 oz (93.3 kg)   SpO2 95%   BMI 27.88 kg/m  , BMI Body mass index is 27.88 kg/m. GEN: Well nourished, well developed, in no acute distress  HEENT: normal  Neck: no JVD, carotid bruits, or masses Cardiac: Regular rate and rhythm; no murmurs, rubs, or gallops, mild bilateral leg edema Respiratory:  clear to auscultation bilaterally, normal work of breathing GI: soft, nontender, nondistended, + BS MS: no deformity or atrophy  Skin: warm and dry, no rash Neuro:  Strength and sensation are intact Psych: euthymic mood, full  affect   EKG:  EKG is ordered today. The ekg ordered today demonstrates sinus rhythm with left bundle branch block.  Heart rate is 62 bpm.   Recent Labs: No results found for requested labs within last 365 days.    Lipid Panel    Component Value Date/Time   CHOL 108 08/13/2012 0735   TRIG 127 08/13/2012 0735   HDL 25 (L) 08/13/2012 0735   CHOLHDL 4.3 08/13/2012 0735   VLDL 25 08/13/2012 0735   LDLCALC 58 08/13/2012 0735      Wt Readings from Last 3 Encounters:  06/05/22 205 lb 9.6 oz (93.3 kg)  10/17/21 228 lb 3.2 oz (103.5 kg)  02/28/21 247 lb (112 kg)        ASSESSMENT AND PLAN:  1.  Persistent atrial  fibrillation: He is maintaining in sinus rhythm with amiodarone.  Bradycardia improved after stopping small dose carvedilol.  After nephrectomy, his creatinine is now above 2.  Continue anticoagulation with low-dose Eliquis 2.5 mg twice daily.  2.  Chronic systolic heart failure: Most recent ejection fraction was 45 to 50%.  I agree with stopping lisinopril and spironolactone post nephrectomy and in the setting of advanced chronic kidney disease.  Continue Imdur 30 mg daily.  I elected to add hydralazine 25 mg twice daily.  No beta-blocker due to previous severe bradycardia.  3. Coronary artery disease involving bypass graft without angina: No antiplatelet therapy at the present time given that he is on Eliquis.  4. Hyperlipidemia: I reviewed most recent lipid profile done in January which showed an LDL of 70.  Continue rosuvastatin 10 mg daily.  5.  Chronic venous insufficiency with recurrent ulceration.  Continue support stockings.  6.  Elevated liver enzymes: This was noted post nephrectomy and I wonder if its related to the surgery 1 way or the other.  I do not think this is related to amiodarone or rosuvastatin given that his LFTs were stable for a while before the surgery.  Continue observation for now and consider repeat liver enzymes in the next 3 to 6 months to  ensure stability.   Disposition:   FU with me in 6 months  Signed,  [name], MD  06/05/2022 11:03 AM    [place]

## 2022-06-14 ENCOUNTER — Other Ambulatory Visit: Payer: Self-pay | Admitting: *Deleted

## 2022-06-14 DIAGNOSIS — I714 Abdominal aortic aneurysm, without rupture, unspecified: Secondary | ICD-10-CM

## 2022-07-03 ENCOUNTER — Encounter: Payer: Self-pay | Admitting: Physician Assistant

## 2022-07-03 ENCOUNTER — Ambulatory Visit (HOSPITAL_COMMUNITY)
Admission: RE | Admit: 2022-07-03 | Discharge: 2022-07-03 | Disposition: A | Discharge status: Home / Self Care | Payer: Medicare Other | Source: Ambulatory Visit | Attending: Vascular Surgery | Admitting: Vascular Surgery

## 2022-07-03 ENCOUNTER — Ambulatory Visit: Payer: Medicare Other | Admitting: Physician Assistant

## 2022-07-03 VITALS — BP 176/81 | HR 51 | Temp 97.8°F | Resp 20 | Ht 72.0 in | Wt 190.0 lb

## 2022-07-03 DIAGNOSIS — I714 Abdominal aortic aneurysm, without rupture, unspecified: Secondary | ICD-10-CM

## 2022-07-03 DIAGNOSIS — M7989 Other specified soft tissue disorders: Secondary | ICD-10-CM

## 2022-07-03 NOTE — Progress Notes (Signed)
[facility] HISTORY AND PHYSICAL   History of Present Illness:  Patient is a 87 y.o. year old male who presents for evaluation of abdominal aortic aneurysm.  Maximal aorta diameter 4.2 cm on his last visit via Duplex 02/27/21. No symptoms referable to AAA. He denies claudication or rest pain.  He has a history of venous insufficiency with ulcers.  He wears compression daily.    He has OA of B knees and uses crutches for ambulation.   He is medically managed on Eliquis for Afib and daily Statin.     Past Medical History:  Diagnosis Date   AAA (abdominal aortic aneurysm) without rupture (HCC)    Coronary artery disease    a. s/p CABG 1987;  b. s/p DES x 2 to SVG in 2005; c. s/p DES x 2 to SVG in 2007;  d. 2 DES placed to SVG and OM1 in 2010 ;  e. s/p PTCA mid body of SVG to OM1/OM2 due to ISR 08/13/12;  f. 10/2012 NSTEMI/Cath/PCI: native 3vd, VG->OM 99isr (3.0x16 Promus DES), LIMA->LAD nl, RIMA->RCA nl, EF 50%.   Diabetes mellitus, type 2 (HCC)    Dyslipidemia (high LDL; low HDL)    Gout    Hyperlipidemia    Hypertension    Myositis    Osteoarthritis    Osteoporosis    Paroxysmal supraventricular tachycardia     Past Surgical History:  Procedure Laterality Date   CARDIAC SURGERY     CARDIOVERSION N/A 12/19/2017   Procedure: CARDIOVERSION;  Surgeon: Jerline Pain, MD;  Location: [facility];  Service: Cardiovascular;  Laterality: N/A;   CORONARY STENT PLACEMENT     EYE SURGERY Right March 2016   Cataract   LEFT HEART CATHETERIZATION WITH CORONARY ANGIOGRAM N/A 08/13/2012   Procedure: LEFT HEART CATHETERIZATION WITH CORONARY ANGIOGRAM;  Surgeon: Burnell Blanks, MD;  Location: [facility] CATH LAB;  Service: Cardiovascular;  Laterality: N/A;   LEFT HEART CATHETERIZATION WITH CORONARY/GRAFT ANGIOGRAM N/A 11/19/2012   Procedure: LEFT HEART CATHETERIZATION WITH [name];  Surgeon: [name], MD;  Location: [facility] CATH LAB;  Service:  Cardiovascular;  Laterality: N/A;   PROSTATE SURGERY       Social History Social History   Tobacco Use   Smoking status: Former    Types: Cigarettes    Quit date: 10/21/1985    Years since quitting: 36.7    Passive exposure: Never   Smokeless tobacco: Never  Substance Use Topics   Alcohol use: No   Drug use: No    Family History Family History  Problem Relation Age of Onset   Thyroid disease Mother    Hypertension Father    Aneurysm Sister     Allergies  Allergies  Allergen Reactions   Lipitor [Atorvastatin] Other (See Comments)    myalgias   Metformin Other (See Comments)    unknown   Morphine And Related Itching   Pravastatin Sodium Other (See Comments)    myalgias   Niaspan [Niacin Er] Rash     Current Outpatient Medications  Medication Sig Dispense Refill   amiodarone (PACERONE) 200 MG tablet TAKE 1 TABLET BY MOUTH EVERY DAY 90 tablet 0   ELIQUIS 2.5 MG TABS tablet TAKE 1 TABLET BY MOUTH TWICE A DAY 60 tablet 5   ferrous sulfate 325 (65 FE) MG tablet Take by mouth.     furosemide (LASIX) 20 MG tablet Take 20 mg by mouth daily as needed.     Glucosamine Sulfate 1500  MG PACK Take by mouth.     hydrALAZINE (APRESOLINE) 25 MG tablet Take 1 tablet (25 mg total) by mouth 2 (two) times daily. 180 tablet 1   isosorbide mononitrate (IMDUR) 30 MG 24 hr tablet Take 1 tablet (30 mg total) by mouth daily. 30 tablet 0   loratadine (CLARITIN) 10 MG tablet Take by mouth.     Misc Natural Products (OSTEO BI-FLEX/5-LOXIN ADVANCED) TABS Take 2 tablets by mouth daily.     nitroGLYCERIN (NITROSTAT) 0.4 MG SL tablet Place 1 tablet (0.4 mg total) under the tongue every 5 (five) minutes as needed. For chest pain 25 tablet 3   Omega-3 Fatty Acids (FISH OIL PO) Take 2 capsules by mouth daily.      rosuvastatin (CRESTOR) 10 MG tablet TAKE 1 TABLET BY MOUTH EVERY DAY 90 tablet 0   sitaGLIPtin (JANUVIA) 25 MG tablet Take 1 tablet by mouth daily.     No current facility-administered  medications for this visit.    ROS:   General:  No weight loss, Fever, chills  HEENT: No recent headaches, no nasal bleeding, no visual changes, no sore throat  Neurologic: No dizziness, blackouts, seizures. No recent symptoms of stroke or mini- stroke. No recent episodes of slurred speech, or temporary blindness.  Cardiac: No recent episodes of chest pain/pressure, no shortness of breath at rest.  No shortness of breath with exertion.  Denies history of atrial fibrillation or irregular heartbeat  Vascular: No history of rest pain in feet.  No history of claudication.  positive history of non-healing ulcer, No history of DVT   Pulmonary: No home oxygen, no productive cough, no hemoptysis,  No asthma or wheezing  Musculoskeletal:  [x ] Arthritis, '[ ]'$  Low back pain,  '[ ]'$  Joint pain  Hematologic:No history of hypercoagulable state.  No history of easy bleeding.  No history of anemia  Gastrointestinal: No hematochezia or melena,  No gastroesophageal reflux, no trouble swallowing  Urinary: '[ ]'$  chronic Kidney disease, '[ ]'$  on HD - '[ ]'$  MWF or '[ ]'$  TTHS, '[ ]'$  Burning with urination, '[ ]'$  Frequent urination, '[ ]'$  Difficulty urinating;   Skin: No rashes  Psychological: No history of anxiety,  No history of depression   Physical Examination  Vitals:   07/03/22 1050  BP: (!) 176/81  Pulse: (!) 51  Resp: 20  Temp: 97.8 F (36.6 C)  TempSrc: Temporal  SpO2: 97%  Weight: 190 lb (86.2 kg)  Height: 6' (1.829 m)    Body mass index is 25.77 kg/m.  General:  Alert and oriented, no acute distress HEENT: Normal Neck: No bruit or JVD Pulmonary: Clear to auscultation bilaterally Cardiac: Regular Rate and Rhythm without murmur Gastrointestinal: Soft, non-tender, non-distended, no mass, no scars Skin: No rash No ischemic skin changes Musculoskeletal: No deformity or positive edema B LE  Neurologic: Upper and lower extremity motor 5/5 and symmetric  DATA:  Abdominal Aorta Findings:   +-----------+-------+----------+----------+--------+--------+--------+  Location  AP (cm)Trans (cm)PSV (cm/s)WaveformThrombusComments  +-----------+-------+----------+----------+--------+--------+--------+  Proximal  2.50   2.32      71                                  +-----------+-------+----------+----------+--------+--------+--------+  Mid       4.67   4.32      100                                 +-----------+-------+----------+----------+--------+--------+--------+  Distal    2.24   2.10      38                                  +-----------+-------+----------+----------+--------+--------+--------+  RT CIA Prox1.6    1.7       69        biphasic                  +-----------+-------+----------+----------+--------+--------+--------+  LT CIA Prox1.4    1.8       109       biphasic                  +-----------+-------+----------+----------+--------+--------+--------+      Summary:  Abdominal Aorta: There is evidence of abnormal dilatation of the mid  Abdominal aorta. Previous AAA measurement appears to have been under  estimated. Re-measurement of previous exam is 4.2 cm longtitudinal.      ASSESSMENT/PLAN: Asymptomatic AAA  The duplex demonstrates minimal increase in diameter from 4.2 to 4.6 cm  He remains asymptomatic for abdominal and lumbar pain out of the ordinary.   I will have him back in 6 months for repeat duplex.  If the diameter is greater than 5.5 he will need a CTA and intervention.      Roxy Horseman PA-C [facility] Office: [phone]  MD in clinic [place]

## 2022-07-09 ENCOUNTER — Other Ambulatory Visit: Payer: Self-pay

## 2022-07-09 DIAGNOSIS — I714 Abdominal aortic aneurysm, without rupture, unspecified: Secondary | ICD-10-CM

## 2022-07-22 ENCOUNTER — Other Ambulatory Visit: Payer: Self-pay | Admitting: Cardiovascular Disease

## 2022-07-24 NOTE — Telephone Encounter (Signed)
Prescription refill request for Eliquis received. Indication:afib Last office visit:11/23 Scr:1.9 Age: 87 Weight:86.2  kg  Prescription refilled

## 2022-07-24 NOTE — Telephone Encounter (Signed)
Refill request

## 2022-07-26 ENCOUNTER — Other Ambulatory Visit: Payer: Self-pay | Admitting: Cardiovascular Disease

## 2022-11-29 ENCOUNTER — Other Ambulatory Visit: Payer: Self-pay | Admitting: Cardiovascular Disease

## 2022-11-29 NOTE — Telephone Encounter (Signed)
Refill request

## 2022-12-18 ENCOUNTER — Ambulatory Visit: Payer: Medicare Other | Attending: Cardiovascular Disease | Admitting: Cardiovascular Disease

## 2022-12-18 VITALS — BP 122/64 | HR 57 | Ht 71.0 in | Wt 211.0 lb

## 2022-12-18 DIAGNOSIS — I2510 Atherosclerotic heart disease of native coronary artery without angina pectoris: Secondary | ICD-10-CM

## 2022-12-18 DIAGNOSIS — E785 Hyperlipidemia, unspecified: Secondary | ICD-10-CM

## 2022-12-18 DIAGNOSIS — I5022 Chronic systolic (congestive) heart failure: Secondary | ICD-10-CM

## 2022-12-18 DIAGNOSIS — I4819 Other persistent atrial fibrillation: Secondary | ICD-10-CM

## 2022-12-18 DIAGNOSIS — I872 Venous insufficiency (chronic) (peripheral): Secondary | ICD-10-CM | POA: Diagnosis not present

## 2022-12-18 NOTE — Progress Notes (Signed)
Cardiology Office Note   Date:  12/18/2022   ID:  Noah Mahoney, DOB Dec 18, 1934, MRN [id number]  PCP:  Gordan Payment., MD  Cardiologist:   Lorine Bears, MD   No chief complaint on file.      History of Present Illness: Noah Mahoney is a 88 y.o. male who presents for a follow-up visit regarding coronary artery disease , persistent atrial fibrillation and chronic systolic heart failure. He is status post coronary artery bypass grafting in 1987 with multiple interventions on the vein graft to the obtuse marginal.  Most recent cardiac catheterization in April of 2014 showed severe native three-vessel coronary artery disease with patent LIMA to LAD and RIMA to RCA. SVG to OM was patent but with 99% in-stent restenosis at the site of previous [place] Balloon angioplasty. He underwent angioplasty and Promus drug-eluting stent placement.  Anginal symptoms are usually manifested by left shoulder pain.  Other medical issues include medium size AAA followed annually by [facility], left bundle branch block, type 2 diabetes, GERD and arthritis.  He was hospitalized in 2019 for atrial fibrillation complicated by heart failure with a drop in ejection fraction to 25%.  He underwent successful cardioversion after loading with amiodarone. Repeat echocardiogram in February 2020 showed improvement in ejection fraction to 45 to 50%.  Due to persistent bradycardia, carvedilol was discontinued.  He underwent robotic assisted right radical nephrectomy in August of 2023 due to renal cell carcinoma.  His lisinopril and spironolactone were discontinued after the surgery.  His blood pressure was elevated during last visit and thus I added hydralazine.  He has been doing well with no recent chest pain or worsening dyspnea.  He has chronic right lower extremity edema which has been stable.  Renal function improved with most recent creatinine of 1.52.  Past Medical History:  Diagnosis Date   AAA (abdominal  aortic aneurysm) without rupture (HCC)    Coronary artery disease    a. s/p CABG 1987;  b. s/p DES x 2 to SVG in 2005; c. s/p DES x 2 to SVG in 2007;  d. 2 DES placed to SVG and OM1 in 2010 ;  e. s/p PTCA mid body of SVG to OM1/OM2 due to ISR 08/13/12;  f. 10/2012 NSTEMI/Cath/PCI: native 3vd, VG->OM 99isr (3.0x16 Promus DES), LIMA->LAD nl, RIMA->RCA nl, EF 50%.   Diabetes mellitus, type 2 (HCC)    Dyslipidemia (high LDL; low HDL)    Gout    Hyperlipidemia    Hypertension    Myositis    Osteoarthritis    Osteoporosis    Paroxysmal supraventricular tachycardia     Past Surgical History:  Procedure Laterality Date   CARDIAC SURGERY     CARDIOVERSION N/A 12/19/2017   Procedure: CARDIOVERSION;  Surgeon: Jake Bathe, MD;  Location: [facility];  Service: Cardiovascular;  Laterality: N/A;   CORONARY STENT PLACEMENT     EYE SURGERY Right March 2016   Cataract   LEFT HEART CATHETERIZATION WITH CORONARY ANGIOGRAM N/A 08/13/2012   Procedure: LEFT HEART CATHETERIZATION WITH CORONARY ANGIOGRAM;  Surgeon: Kathleene Hazel, MD;  Location: [facility] CATH LAB;  Service: Cardiovascular;  Laterality: N/A;   LEFT HEART CATHETERIZATION WITH CORONARY/GRAFT ANGIOGRAM N/A 11/19/2012   Procedure: LEFT HEART CATHETERIZATION WITH Isabel Caprice;  Surgeon: Iran Ouch, MD;  Location: [facility] CATH LAB;  Service: Cardiovascular;  Laterality: N/A;   PROSTATE SURGERY       Current Outpatient Medications  Medication Sig Dispense Refill   amiodarone (  PACERONE) 200 MG tablet TAKE 1 TABLET BY MOUTH EVERY DAY 90 tablet 2   ELIQUIS 2.5 MG TABS tablet TAKE 1 TABLET BY MOUTH TWICE A DAY 60 tablet 5   ferrous sulfate 325 (65 FE) MG tablet Take by mouth.     furosemide (LASIX) 20 MG tablet Take 20 mg by mouth daily as needed.     Glucosamine Sulfate 1500 MG PACK Take by mouth.     hydrALAZINE (APRESOLINE) 25 MG tablet TAKE 1 TABLET BY MOUTH TWICE A DAY 180 tablet 0   isosorbide mononitrate (IMDUR) 30 MG 24 hr  tablet Take 1 tablet (30 mg total) by mouth daily. 30 tablet 0   Misc Natural Products (OSTEO BI-FLEX/5-LOXIN ADVANCED) TABS Take 2 tablets by mouth daily.     nitroGLYCERIN (NITROSTAT) 0.4 MG SL tablet Place 1 tablet (0.4 mg total) under the tongue every 5 (five) minutes as needed. For chest pain 25 tablet 3   Omega-3 Fatty Acids (FISH OIL PO) Take 2 capsules by mouth daily.      rosuvastatin (CRESTOR) 10 MG tablet TAKE 1 TABLET BY MOUTH EVERY DAY 90 tablet 0   loratadine (CLARITIN) 10 MG tablet Take by mouth. (Patient not taking: Reported on 12/18/2022)     sitaGLIPtin (JANUVIA) 25 MG tablet Take 1 tablet by mouth daily. (Patient not taking: Reported on 12/18/2022)     No current facility-administered medications for this visit.    Allergies:   Lipitor [atorvastatin], Metformin, Morphine and codeine, Pravastatin sodium, and Niaspan [niacin er]    Social History:  The patient  reports that he quit smoking about 37 years ago. His smoking use included cigarettes. He has never been exposed to tobacco smoke. He has never used smokeless tobacco. He reports that he does not drink alcohol and does not use drugs.   Family History:  The patient's family history includes Aneurysm in his sister; Hypertension in his father; Thyroid disease in his mother.    ROS:  Please see the history of present illness.   Otherwise, review of systems are positive for none.   All other systems are reviewed and negative.    PHYSICAL EXAM: VS:  BP 122/64   Pulse (!) 57   Ht 5\' 11"  (1.803 m)   Wt 211 lb (95.7 kg)   SpO2 98%   BMI 29.43 kg/m  , BMI Body mass index is 29.43 kg/m. GEN: Well nourished, well developed, in no acute distress  HEENT: normal  Neck: no JVD, carotid bruits, or masses Cardiac: Regular rate and rhythm; no murmurs, rubs, or gallops, mild bilateral leg edema Respiratory:  clear to auscultation bilaterally, normal work of breathing GI: soft, nontender, nondistended, + BS MS: no deformity or  atrophy  Skin: warm and dry, no rash Neuro:  Strength and sensation are intact Psych: euthymic mood, full affect   EKG:  EKG is ordered today. The ekg ordered today demonstrates sinus bradycardia with left bundle branch block.   Recent Labs: No results found for requested labs within last 365 days.    Lipid Panel    Component Value Date/Time   CHOL 108 08/13/2012 0735   TRIG 127 08/13/2012 0735   HDL 25 (L) 08/13/2012 0735   CHOLHDL 4.3 08/13/2012 0735   VLDL 25 08/13/2012 0735   LDLCALC 58 08/13/2012 0735      Wt Readings from Last 3 Encounters:  12/18/22 211 lb (95.7 kg)  07/03/22 190 lb (86.2 kg)  06/05/22 205 lb 9.6 oz (93.3 kg)  ASSESSMENT AND PLAN:  1.  Persistent atrial fibrillation: He is maintaining in sinus rhythm with amiodarone.  Bradycardia improved after stopping small dose carvedilol.  His renal function improved but his creatinine still slightly above 1.5.  Thus, we will continue with current dose of Eliquis 2.5 mg twice daily.  2.  Chronic systolic heart failure: Most recent ejection fraction was 45 to 50%.  His lisinopril and spironolactone were discontinued after nephrectomy.  Continue Imdur and hydralazine.  No beta-blocker due to previous severe bradycardia.  3. Coronary artery disease involving bypass graft without angina: No antiplatelet therapy at the present time given that he is on Eliquis.  4. Hyperlipidemia: I reviewed most recent lipid profile done in January which showed an LDL of 70.  Continue rosuvastatin 10 mg daily.   5.  Chronic venous insufficiency with recurrent ulceration.  Continue support stockings.  6.  Elevated liver enzymes: Improved but did not normalize.  No need to discontinue amiodarone.   Disposition:   FU with me in 6 months  Signed,  Lorine Bears, MD  12/18/2022 10:41 AM    [facility]

## 2022-12-18 NOTE — Patient Instructions (Signed)
Medication Instructions:  No changes *If you need a refill on your cardiac medications before your next appointment, please call your pharmacy*   Lab Work: None ordered If you have labs (blood work) drawn today and your tests are completely normal, you will receive your results only by: MyChart Message (if you have MyChart) OR A paper copy in the mail If you have any lab test that is abnormal or we need to change your treatment, we will call you to review the results.   Testing/Procedures: None ordered   Follow-Up: At [facility], you and your health needs are our priority.  As part of our continuing mission to provide you with exceptional heart care, we have created designated Provider Care Teams.  These Care Teams include your primary Cardiologist (physician) and Advanced Practice Providers (APPs -  Physician Assistants and Nurse Practitioners) who all work together to provide you with the care you need, when you need it.  We recommend signing up for the patient portal called "MyChart".  Sign up information is provided on this After Visit Summary.  MyChart is used to connect with patients for Virtual Visits (Telemedicine).  Patients are able to view lab/test results, encounter notes, upcoming appointments, etc.  Non-urgent messages can be sent to your provider as well.   To learn more about what you can do with MyChart, go to [email or web address].    Your next appointment:   6 month(s)  Provider:   Muhammad Arida, MD   

## 2023-01-04 ENCOUNTER — Ambulatory Visit: Payer: Medicare Other | Admitting: Physician Assistant

## 2023-01-04 ENCOUNTER — Ambulatory Visit (HOSPITAL_COMMUNITY)
Admission: RE | Admit: 2023-01-04 | Discharge: 2023-01-04 | Disposition: A | Discharge status: Home / Self Care | Payer: Medicare Other | Source: Ambulatory Visit | Attending: Vascular Surgery | Admitting: Vascular Surgery

## 2023-01-04 VITALS — BP 110/51 | HR 52 | Temp 98.3°F | Resp 18 | Ht 70.0 in | Wt 190.0 lb

## 2023-01-04 DIAGNOSIS — I714 Abdominal aortic aneurysm, without rupture, unspecified: Secondary | ICD-10-CM

## 2023-01-04 DIAGNOSIS — M7989 Other specified soft tissue disorders: Secondary | ICD-10-CM

## 2023-01-04 NOTE — Progress Notes (Signed)
HISTORY AND PHYSICAL     CC:  follow up. Requesting Provider:  Gordan Payment., MD  HPI: This is a 87 y.o. male who is here today for follow up for AAA.  He was originally seen in 2014 for AAA and has been followed for surveillance since.    Pt was last seen 07/03/2022 and at that time, pt was doing well without claludication or rest pain.  He did not have any new abdominal or back pain.   The pt returns today for follow up.  He denies any new abdominal or back pain.  He is walking with crutches due to orthopedic issues with his knees and hip.  He states he has bone on bone.  He is getting around and working in his garden.  He does have some leg swelling and wears compression daily.  He did want to get a couple of new pairs of compression socks today.  He has some padding between the right 4th and 5th toes b/c they rub but there is no sore present.  He does not have any rest pain.    Pt did have a right radial nephrectomy for RCC on 03/03/2022.  He state he now has a hernia but it is not painful and surgery was not recommended.   He states he quit smoking after $100,000 dollar reason to quit (open heart surgery 1980's).  Says he had smoked since he was 8.    The pt is on a statin for cholesterol management.    The pt is not on an aspirin.    Other AC:  Eliquis The pt is on diuretic The pt is  on medication diabetes. Tobacco hx:  former  Pt does not have family hx of AAA. He does not have biological children.    Past Medical History:  Diagnosis Date   AAA (abdominal aortic aneurysm) without rupture (HCC)    Coronary artery disease    a. s/p CABG 1987;  b. s/p DES x 2 to SVG in 2005; c. s/p DES x 2 to SVG in 2007;  d. 2 DES placed to SVG and OM1 in 2010 ;  e. s/p PTCA mid body of SVG to OM1/OM2 due to ISR 08/13/12;  f. 10/2012 NSTEMI/Cath/PCI: native 3vd, VG->OM 99isr (3.0x16 Promus DES), LIMA->LAD nl, RIMA->RCA nl, EF 50%.   Diabetes mellitus, type 2 (HCC)    Dyslipidemia (high LDL; low  HDL)    Gout    Hyperlipidemia    Hypertension    Myositis    Osteoarthritis    Osteoporosis    Paroxysmal supraventricular tachycardia     Past Surgical History:  Procedure Laterality Date   CARDIAC SURGERY     CARDIOVERSION N/A 12/19/2017   Procedure: CARDIOVERSION;  Surgeon: Jake Bathe, MD;  Location: [facility] ENDOSCOPY;  Service: Cardiovascular;  Laterality: N/A;   CORONARY STENT PLACEMENT     EYE SURGERY Right March 2016   Cataract   LEFT HEART CATHETERIZATION WITH CORONARY ANGIOGRAM N/A 08/13/2012   Procedure: LEFT HEART CATHETERIZATION WITH CORONARY ANGIOGRAM;  Surgeon: Kathleene Hazel, MD;  Location: [facility] CATH LAB;  Service: Cardiovascular;  Laterality: N/A;   LEFT HEART CATHETERIZATION WITH CORONARY/GRAFT ANGIOGRAM N/A 11/19/2012   Procedure: LEFT HEART CATHETERIZATION WITH Isabel Caprice;  Surgeon: Iran Ouch, MD;  Location: [facility] CATH LAB;  Service: Cardiovascular;  Laterality: N/A;   PROSTATE SURGERY      Allergies  Allergen Reactions   Lipitor [Atorvastatin] Other (See Comments)  myalgias   Metformin Other (See Comments)    unknown   Morphine And Codeine Itching   Pravastatin Sodium Other (See Comments)    myalgias   Niaspan [Niacin Er] Rash    Current Outpatient Medications  Medication Sig Dispense Refill   amiodarone (PACERONE) 200 MG tablet TAKE 1 TABLET BY MOUTH EVERY DAY 90 tablet 2   ELIQUIS 2.5 MG TABS tablet TAKE 1 TABLET BY MOUTH TWICE A DAY 60 tablet 5   ferrous sulfate 325 (65 FE) MG tablet Take by mouth.     furosemide (LASIX) 20 MG tablet Take 20 mg by mouth daily as needed.     Glucosamine Sulfate 1500 MG PACK Take by mouth.     hydrALAZINE (APRESOLINE) 25 MG tablet TAKE 1 TABLET BY MOUTH TWICE A DAY 180 tablet 0   isosorbide mononitrate (IMDUR) 30 MG 24 hr tablet Take 1 tablet (30 mg total) by mouth daily. 30 tablet 0   loratadine (CLARITIN) 10 MG tablet Take by mouth. (Patient not taking: Reported on 12/18/2022)     Misc  Natural Products (OSTEO BI-FLEX/5-LOXIN ADVANCED) TABS Take 2 tablets by mouth daily.     nitroGLYCERIN (NITROSTAT) 0.4 MG SL tablet Place 1 tablet (0.4 mg total) under the tongue every 5 (five) minutes as needed. For chest pain 25 tablet 3   Omega-3 Fatty Acids (FISH OIL PO) Take 2 capsules by mouth daily.      rosuvastatin (CRESTOR) 10 MG tablet TAKE 1 TABLET BY MOUTH EVERY DAY 90 tablet 0   sitaGLIPtin (JANUVIA) 25 MG tablet Take 1 tablet by mouth daily. (Patient not taking: Reported on 12/18/2022)     No current facility-administered medications for this visit.    Family History  Problem Relation Age of Onset   Thyroid disease Mother    Hypertension Father    Aneurysm Sister     Social History   Socioeconomic History   Marital status: Married    Spouse name: Not on file   Number of children: Not on file   Years of education: Not on file   Highest education level: Not on file  Occupational History   Not on file  Tobacco Use   Smoking status: Former    Types: Cigarettes    Quit date: 10/21/1985    Years since quitting: 37.2    Passive exposure: Never   Smokeless tobacco: Never  Substance and Sexual Activity   Alcohol use: No   Drug use: No   Sexual activity: Not on file  Other Topics Concern   Not on file  Social History Narrative   Not on file   Social Determinants of Health   Financial Resource Strain: Not on file  Food Insecurity: Not on file  Transportation Needs: Not on file  Physical Activity: Not on file  Stress: Not on file  Social Connections: Not on file  Intimate Partner Violence: Not on file     REVIEW OF SYSTEMS:   [X]  denotes positive finding, [ ]  denotes negative finding Cardiac  Comments:  Chest pain or chest pressure:    Shortness of breath upon exertion:    Short of breath when lying flat:    Irregular heart rhythm:        Vascular    Pain in calf, thigh, or hip brought on by ambulation:    Pain in feet at night that wakes you up from  your sleep:     Blood clot in your veins:    Leg  swelling:         Pulmonary    Oxygen at home:    Productive cough:     Wheezing:         Neurologic    Sudden weakness in arms or legs:     Sudden numbness in arms or legs:     Sudden onset of difficulty speaking or slurred speech:    Temporary loss of vision in one eye:     Problems with dizziness:         Gastrointestinal    Blood in stool:     Vomited blood:         Genitourinary    Burning when urinating:     Blood in urine:        Psychiatric    Major depression:         Hematologic    Bleeding problems:    Problems with blood clotting too easily:        Skin    Rashes or ulcers:        Constitutional    Fever or chills:      PHYSICAL EXAMINATION:  Today's Vitals   01/04/23 1014  BP: (!) 110/51  Pulse: (!) 52  Resp: 18  Temp: 98.3 F (36.8 C)  TempSrc: Temporal  SpO2: 97%  Weight: 190 lb (86.2 kg)  Height: 5\' 10"  (1.778 m)  PainSc: 0-No pain   Body mass index is 27.26 kg/m.   General:  WDWN in NAD; vital signs documented above Gait: Not observed HENT: WNL, normocephalic Pulmonary: normal non-labored breathing  Cardiac: irregular HR, without carotid bruits Abdomen: soft, NT; aortic pulse is not palpable Skin: without rashes Vascular Exam/Pulses:  Right Left  Radial 2+ (normal) 2+ (normal)  Popliteal Unable to palpate Unable to palpate  DP 2+ (normal) 2+ (normal)   Extremities: without ischemic changes, without Gangrene , without cellulitis; without open wounds; BLE swelling  Musculoskeletal: no muscle wasting or atrophy  Neurologic: A&O X 3;  No focal weakness or paresthesias are detected Psychiatric:  The pt has Normal affect.   Non-Invasive Vascular Imaging:   AAA Arterial duplex on 01/04/2023: Abdominal Aorta Findings:  +-----------+-------+----------+----------+--------+--------+--------+  Location  AP (cm)Trans (cm)PSV (cm/s)WaveformThrombusComments   +-----------+-------+----------+----------+--------+--------+--------+  Proximal  2.57   2.70      116                                 +-----------+-------+----------+----------+--------+--------+--------+  Mid       4.62   4.33      114                                 +-----------+-------+----------+----------+--------+--------+--------+  Distal    2.22   2.24      63                                  +-----------+-------+----------+----------+--------+--------+--------+  RT CIA Prox1.6    1.5       76        biphasic                  +-----------+-------+----------+----------+--------+--------+--------+  LT CIA Prox1.4    1.3       112       biphasic                  +-----------+-------+----------+----------+--------+--------+--------+  Summary:  Abdominal Aorta: There is evidence of abnormal dilatation of the mid Abdominal aorta. The largest aortic measurement is 4.6 cm. The largest aortic diameter remains essentially unchanged compared to prior exam. Previous diameter measurement was 4.7 cm obtained on 07/03/22.   Previous AAA arterial duplex on 07/03/2022: Abdominal Aorta Findings:  +-----------+-------+----------+----------+--------+--------+--------+  Location  AP (cm)Trans (cm)PSV (cm/s)WaveformThrombusComments  +-----------+-------+----------+----------+--------+--------+--------+  Proximal  2.50   2.32      71                                  +-----------+-------+----------+----------+--------+--------+--------+  Mid       4.67   4.32      100                                 +-----------+-------+----------+----------+--------+--------+--------+  Distal    2.24   2.10      38                                  +-----------+-------+----------+----------+--------+--------+--------+  RT CIA Prox1.6    1.7       69        biphasic                   +-----------+-------+----------+----------+--------+--------+--------+  LT CIA Prox1.4    1.8       109       biphasic                  +-----------+-------+----------+----------+--------+--------+--------+    ASSESSMENT/PLAN:: 88 y.o. male here for follow up for AAA  AAA -pt AAA measures 4.6cm today, which is essentially unchanged from 6 months ago.  He has not had any new abdominal or back pain.   -recommend continuing statin.  He is not on asa as he is on Eliquis for afib.  -discussed that if he develops new sudden, severe abdominal or back pain, he should call 911.   -pt will f/u in 6 months with AAA duplex.  BLE swelling -he will continue to wear his compression stockings.  He did get two pair of knee high compression today.    Doreatha Massed, [facility] [phone]  Clinic MD:   Karin Lieu

## 2023-01-07 ENCOUNTER — Other Ambulatory Visit: Payer: Self-pay | Admitting: *Deleted

## 2023-01-07 DIAGNOSIS — I714 Abdominal aortic aneurysm, without rupture, unspecified: Secondary | ICD-10-CM

## 2023-01-29 ENCOUNTER — Other Ambulatory Visit: Payer: Self-pay | Admitting: Cardiovascular Disease

## 2023-01-30 NOTE — Telephone Encounter (Signed)
Prescription refill request for Eliquis received. Indication:afib Last office visit:5/24 Scr:1.51  5/24 Age: 88 Weight:86.2  kg  Prescription refilled

## 2023-02-23 ENCOUNTER — Other Ambulatory Visit: Payer: Self-pay | Admitting: Cardiovascular Disease

## 2023-04-02 ENCOUNTER — Other Ambulatory Visit: Payer: Self-pay | Admitting: Cardiovascular Disease

## 2023-06-03 ENCOUNTER — Telehealth: Payer: Self-pay | Admitting: Cardiovascular Disease

## 2023-06-03 ENCOUNTER — Ambulatory Visit: Payer: Medicare Other | Admitting: Cardiology

## 2023-06-03 NOTE — Telephone Encounter (Signed)
Spoke to patient has edema in both legs from his knees down. He also report they a draining. He is unsure if he has had any weight gain in the last 2-3 days. He does report current weight is 235 lb. Last weight in chart was June 7th 190 lb. He deny any symptoms at this time.He does report redness to his left leg but can not see if the right leg is red. Advised him per Erlinda Hong that he should go to the ER. Patient verbalized understanding and agree.

## 2023-06-03 NOTE — Telephone Encounter (Signed)
Pt c/o swelling/edema: STAT if pt has developed SOB within 24 hours  If swelling, where is the swelling located? Knees down  How much weight have you gained and in what time span? Unsure  Have you gained 2 pounds in a day or 5 pounds in a week? Does not know  Do you have a log of your daily weights (if so, list)? No, when he last weighed he was 190 some lbs unsure of when that was  Are you currently taking a fluid pill? Yes  Are you currently SOB? No   Have you traveled recently in a car or plane for an extended period of time? No

## 2023-06-04 DIAGNOSIS — I480 Paroxysmal atrial fibrillation: Secondary | ICD-10-CM | POA: Diagnosis not present

## 2023-06-04 DIAGNOSIS — R600 Localized edema: Secondary | ICD-10-CM | POA: Diagnosis not present

## 2023-06-04 DIAGNOSIS — Z77090 Contact with and (suspected) exposure to asbestos: Secondary | ICD-10-CM

## 2023-06-04 DIAGNOSIS — I495 Sick sinus syndrome: Secondary | ICD-10-CM | POA: Diagnosis not present

## 2023-06-04 DIAGNOSIS — I5032 Chronic diastolic (congestive) heart failure: Secondary | ICD-10-CM | POA: Diagnosis not present

## 2023-06-04 DIAGNOSIS — I2510 Atherosclerotic heart disease of native coronary artery without angina pectoris: Secondary | ICD-10-CM

## 2023-06-04 DIAGNOSIS — I447 Left bundle-branch block, unspecified: Secondary | ICD-10-CM

## 2023-06-04 DIAGNOSIS — Z79899 Other long term (current) drug therapy: Secondary | ICD-10-CM

## 2023-06-04 DIAGNOSIS — Z7901 Long term (current) use of anticoagulants: Secondary | ICD-10-CM

## 2023-06-04 NOTE — Progress Notes (Signed)
Cardiology Office Note:    Date:  06/17/2023   ID:  Noah Mahoney, DOB 1934/12/24, MRN [id number]  PCP:  Gordan Payment., MD  Cardiologist:  Lorine Bears, MD     Referring MD: Gordan Payment., MD   Chief Complaint: follow-up of CHF   History of Present Illness:    Noah Mahoney is a 88 y.o. male with a history of CAD s/p remote CABG in 1987 with multiple subsequent PCIs to the SVG to OM (most recently an angioplasty and DES to in-stent restenosis in 10/2012), chronic HFrEF with EF of 45-50% on Echo in 08/2018, paroxysmal atrial fibrillation on Eliquis, LBBB, AAA followed by Vascular Surgery, hypertension, hyperlipidemia, CKD stage IIIb and GERD who is followed by Dr. Kirke Corin and presents today for follow-up of CHF.   Patient has a long history of CAD with remote CABG in 1987. Since that time he has undergone multiple PCI to the SVG to OM. Last cardiac catheterization in 10/2012 showed patent LIMA to LAD and RIMA to RCA but 99% in-stent restenosis of SVG to OM at site of recent angioplasty in 07/2012 as well as 80% stenosis of 1st Diag. He underwent successful PCI with angioplasty and DES to SVG to OM in-stent restenosis. He was hospitalized in 2019 with atrial fibrillation complicated by CHF. EF had dropped to 25% at that time. He underwent successful DCCV after being loaded with Amiodarone. Most recent Echo in 08/2018 showed LVEF of 45-50% with septal and apical hypokinesis. He also has known AAA which is followed by Vascular Surgery.  Patient called our office on 06/03/2023 and reported significant lower extremity edema and weight gain. He was advised to go to the ED. Patient was admitted at [facility] and was discharged on 06/08/2023. Unfortunately, I have no records from this hospitalization. He presents today for follow-up with his nephew who is also his caregiver. Patient states he was told edema was from liver disease, specially hepatitis and cirrhosis, and not CHF. He states he was  seen by Cardiology during admission and was told it was not coming from CHF and that his "heart condition had improved since initial diagnosis". However, his nephew is not sure if he really saw Cardiology during admission. He states multiple medications were stopped at discharge and could not tell me what dose of Lasix he was discharged on. He continues to have significant lower extremity edema with no real improvement since hospitalization. He has some lower extremity wounds that are weeping because they are so edematous. His nephew states the edema started about 2 months ago. He has been taking care of his wounds at home and is adamant that he does not want a referral to wound care. He has been wearing compression stockings but I expressed concern with this given his wounds. He weighs 240 lb on our office scales today but suspect this is not accurate. He states he weighed 215 lbs on new home scale this morning. This is slightly up from his last office visit with Dr. Kirke Corin in 11/2022 where he weighed 211 lbs. Other than the edema, he really denies any cardiac symptoms. He denies any chest pain, shortness of breath, orthopnea, PND, palpitations, lightheadedness, dizziness, or syncope. He does report some burning in his thighs when he walks as well as significant knee pain but states this is not new and he has had this for years. It sounds like he had ultrasounds of his legs during recent admission.  He also reports his  stools a black which again is not a new thing. This may be due to his iron supplement.   He lives alone and is able to perform all activities of daily living by himself including dressing, bathing, and cooking for himself. However, it has been difficult for him to get up and walk lately due to the edema.   EKGs/Labs/Other Studies Reviewed:    The following studies were reviewed:  Echocardiogram 09/26/2018: Impression: 1. The left ventricle has mildly reduced systolic function, with an   ejection fraction of 45-50%. The cavity size was mildly dilated. There is  mildly increased left ventricular wall thickness. Left ventricular  diastolic parameters were normal.   2. Image quality poor even with definity Septal and apical hypokinesis      suggest MRI if clinically indicated to assess EF more accurately.   3. Left atrial size was moderately dilated.   4. The mitral valve is normal in structure. Mild thickening of the mitral  valve leaflet. Mild calcification of the mitral valve leaflet.   5. The tricuspid valve is normal in structure.   6. The aortic valve is tricuspid Mild thickening of the aortic valve Mild  calcification of the aortic valve. no stenosis of the aortic valve.   7. The pulmonic valve was grossly normal. Pulmonic valve regurgitation is  mild by color flow Doppler.   8. The aortic root is normal in size and structure.  _______________  AAA Duplex 01/04/2023: Summary: Abdominal Aorta: There is evidence of abnormal dilatation of the mid  Abdominal aorta. The largest aortic measurement is 4.6 cm. The largest  aortic diameter remains essentially unchanged compared to prior exam.  Previous diameter measurement was 4.7 cm obtained on 07/03/22.   EKG:  EKG not ordered today.   Recent Labs: No results found for requested labs within last 365 days.  Recent Lipid Panel    Component Value Date/Time   CHOL 108 08/13/2012 0735   TRIG 127 08/13/2012 0735   HDL 25 (L) 08/13/2012 0735   CHOLHDL 4.3 08/13/2012 0735   VLDL 25 08/13/2012 0735   LDLCALC 58 08/13/2012 0735    Physical Exam:    Vital Signs: BP (!) 114/58 (BP Location: Left Arm, Patient Position: Sitting, Cuff Size: Normal)   Pulse (!) 55   Ht 5\' 10"  (1.778 m)   Wt 240 lb 6.4 oz (109 kg)   SpO2 99%   BMI 34.49 kg/m     Wt Readings from Last 3 Encounters:  06/17/23 240 lb 6.4 oz (109 kg)  01/04/23 190 lb (86.2 kg)  12/18/22 211 lb (95.7 kg)     General: 88 y.o. Caucasian male in no acute  distress. HEENT: Normocephalic and atraumatic. Sclera clear.  Neck: Supple. Marland Kitchen No JVD. Heart: RRR. Distinct S1 and S2. No murmurs, gallops, or rubs.  Lungs: No increased work of breathing. Decreased breath sounds in bases but no significant wheezes, rhonchi, or rales appreciated.  Abdomen: Soft, non-distended, and non-tender to palpation.  Extremities: 3+ pitting edema of bilateral lower extremities with hyperpigmentation/ erythema and weeping wounds.  Skin: Warm and dry. Neuro: No focal deficits. Psych: Normal affect. Responds appropriately.   Assessment:    1. Chronic HFrEF (heart failure with reduced ejection fraction) (HCC)   2. Coronary artery disease involving native coronary artery of native heart without angina pectoris   3. S/P CABG (coronary artery bypass graft)   4. Paroxysmal atrial fibrillation (HCC)   5. Abdominal aortic aneurysm (AAA) without rupture, unspecified part (  HCC)   6. Primary hypertension   7. Hyperlipidemia, unspecified hyperlipidemia type   8. Stage 3b chronic kidney disease (HCC)     Plan:    Lower Extremity Edema Patient has significant bilateral lower extremity edema which apparently started about 2 months. He was recently admitted at [facility] for this and tells me he was told this was due to hepatic cirrhosis rather than CHF. It sounds like he had lower extremity dopplers and Echo done at that time but I unfortunately due to no have any records from this admission.  - He still has significantly lower extremity edema with weeping wounds.  - Based on external pharmacy records that I found after today's visit, it looks like he was discharged on Bumex 1mg  twice daily. However, I am still waiting on records from [facility]. We requested that these be faxed to [place] ASAP. I want to be careful with his diuretics given his CKD stage IIIb. I do not know what his renal function was running in the hospital. Will check BNP and CMET today.  - Patient is  currently using compression stockings but I expressed concern with this given his lower extremity wounds. I recommended referral to wound care but he declined. He has follow-up with his PCP later this week and strongly recommended discussing this again with his PCP.   It is difficult to make adjustments to medications right now without knowing exactly what took place during recent hospitalization, what most recent labs are, and what medications he was discharged on. Recommended continuing current medications for now until I can get more information from [facility]  If edema truly due to hepatic cirrhosis, may need to defer management of diuresis to GI and Nephrology given he also has CKD stage IIIb.   Chronic HFrEF EF as low as 25% in 2019 in setting of atrial fibrillation. EF improved after restoration of sinus rhythm. Echo in 08/2018 showed LVEF of 45-50% with septal and apical hypokinesis.  - He has significant lower extremity as above. Weight is significantly up on office scales today at 240 lbs but patient states he weighed 215 lbs on a new home scale this morning (up from 211 lbs in 12/2022). He denies any other symptoms of CHF.  - It looks like he was discharged on Bumex 1mg  twice daily but waiting on records from [facility] to confirm.  - Continue Hydralazine 25mg  twice daily. - Discussed importance of daily weights and advised patient to let us know if he gains 3lbs in 1 day or 5lbs in 1 week.   CAD s/p CABG History of remote CABG in 1987 with multiple subsequent PCIs to the SVG to OM (most recently a angioplasty and DES to in-stent restenosis in 10/2012). - No chest pain.  - He was previously on Imdur 30mg  daily but it sounds like this was stopped during recent admission.  - No aspirin due to need for DOAC.  - Continue statin.   Paroxysmal Atrial Fibrillation Diagnosed in 2019. S/p successful DCCV at that time.  - Maintaining sinus rhythm on exam.  - Previously on Amiodarone but  this was reportedly stopped during recent hospitalization given cirrhosis. He was reportedly restarted on Coreg instead (it looks like 3.125mg  twice daily based on records from external pharmacy although patient was saying a different dose). Wwill request records from [place] be faxed to [place] ASAP. - Continue Eliquis 2.5mg  twice daily. Reduced dose due to age and renal function. - Will check CBC today.  AAA  AAA duplex in 12/2022 showed largest aortic measurement of 4.6 cm which was essentially unchanged from prior imaging in 06/2022.  - Followed by Vascular Surgery.   Hypertension BP well controlled.  - Continue medications for CHF as above.  Hyperlipidemia Lipid panel in 11/2022: Total Cholesterol 112, Triglycerides 134, HDL 35, LDL 55. LDL goal <55 given CAD.  -  Previously on Crestor 10mg  daily. It sounds like this may of been stopped during recent admission given cirrhosis. Will request records from [place].   CKD Stage IIIb Baseline creatinine around 1.8 to 1.9.  - Unclear what renal function was during recent admission. - Will repeat CMET today. - Followed by Nephrology at [facility].   Disposition: Follow up in 3-4 weeks.    Signed, Corrin Parker, PA-C  06/17/2023 10:12 PM    [facility]

## 2023-06-04 NOTE — Telephone Encounter (Signed)
Attempted to reach the patient. Was unable to leave a message due to the voicemail being full.  °

## 2023-06-05 DIAGNOSIS — I495 Sick sinus syndrome: Secondary | ICD-10-CM | POA: Diagnosis not present

## 2023-06-05 DIAGNOSIS — R600 Localized edema: Secondary | ICD-10-CM | POA: Diagnosis not present

## 2023-06-05 DIAGNOSIS — I5032 Chronic diastolic (congestive) heart failure: Secondary | ICD-10-CM | POA: Diagnosis not present

## 2023-06-05 DIAGNOSIS — I480 Paroxysmal atrial fibrillation: Secondary | ICD-10-CM | POA: Diagnosis not present

## 2023-06-06 DIAGNOSIS — I480 Paroxysmal atrial fibrillation: Secondary | ICD-10-CM | POA: Diagnosis not present

## 2023-06-06 DIAGNOSIS — I495 Sick sinus syndrome: Secondary | ICD-10-CM | POA: Diagnosis not present

## 2023-06-06 DIAGNOSIS — I5032 Chronic diastolic (congestive) heart failure: Secondary | ICD-10-CM | POA: Diagnosis not present

## 2023-06-06 DIAGNOSIS — R600 Localized edema: Secondary | ICD-10-CM | POA: Diagnosis not present

## 2023-06-06 NOTE — Telephone Encounter (Signed)
Called to speak to the patient tog et an update. The nephew stated that the patient was currently in [facility] in [place]. He did not have an update to give other than the patient was still having swelling problems.

## 2023-06-17 ENCOUNTER — Ambulatory Visit: Payer: Medicare Other | Attending: Student | Admitting: Student

## 2023-06-17 ENCOUNTER — Encounter: Payer: Self-pay | Admitting: Student

## 2023-06-17 VITALS — BP 114/58 | HR 55 | Ht 70.0 in | Wt 240.4 lb

## 2023-06-17 DIAGNOSIS — I714 Abdominal aortic aneurysm, without rupture, unspecified: Secondary | ICD-10-CM

## 2023-06-17 DIAGNOSIS — I5022 Chronic systolic (congestive) heart failure: Secondary | ICD-10-CM

## 2023-06-17 DIAGNOSIS — I2510 Atherosclerotic heart disease of native coronary artery without angina pectoris: Secondary | ICD-10-CM

## 2023-06-17 DIAGNOSIS — Z951 Presence of aortocoronary bypass graft: Secondary | ICD-10-CM | POA: Diagnosis not present

## 2023-06-17 DIAGNOSIS — N1832 Chronic kidney disease, stage 3b: Secondary | ICD-10-CM

## 2023-06-17 DIAGNOSIS — I10 Essential (primary) hypertension: Secondary | ICD-10-CM

## 2023-06-17 DIAGNOSIS — E785 Hyperlipidemia, unspecified: Secondary | ICD-10-CM

## 2023-06-17 DIAGNOSIS — I480 Paroxysmal atrial fibrillation: Secondary | ICD-10-CM

## 2023-06-17 NOTE — Patient Instructions (Signed)
Medication Instructions:  No changes *If you need a refill on your cardiac medications before your next appointment, please call your pharmacy*   Lab Work: Today we will draw CBC, BNP, and CMet If you have labs (blood work) drawn today and your tests are completely normal, you will receive your results only by: MyChart Message (if you have MyChart) OR A paper copy in the mail If you have any lab test that is abnormal or we need to change your treatment, we will call you to review the results.   Testing/Procedures: No testing   Follow-Up: At [facility], you and your health needs are our priority.  As part of our continuing mission to provide you with exceptional heart care, we have created designated Provider Care Teams.  These Care Teams include your primary Cardiologist (physician) and Advanced Practice Providers (APPs -  Physician Assistants and Nurse Practitioners) who all work together to provide you with the care you need, when you need it.  We recommend signing up for the patient portal called "MyChart".  Sign up information is provided on this After Visit Summary.  MyChart is used to connect with patients for Virtual Visits (Telemedicine).  Patients are able to view lab/test results, encounter notes, upcoming appointments, etc.  Non-urgent messages can be sent to your provider as well.   To learn more about what you can do with MyChart, go to [email or web address].    Your next appointment:   1 month(s)  Provider:   Lorine Bears, MD  or any APP

## 2023-06-18 LAB — CBC
Hematocrit: 29.7 % — ABNORMAL LOW (ref 37.5–51.0)
Hemoglobin: 9.6 g/dL — ABNORMAL LOW (ref 13.0–17.7)
MCH: 34.4 pg — ABNORMAL HIGH (ref 26.6–33.0)
MCHC: 32.3 g/dL (ref 31.5–35.7)
MCV: 107 fL — ABNORMAL HIGH (ref 79–97)
Platelets: 140 10*3/uL — ABNORMAL LOW (ref 150–450)
RBC: 2.79 x10E6/uL — ABNORMAL LOW (ref 4.14–5.80)
RDW: 12.1 % (ref 11.6–15.4)
WBC: 5.9 10*3/uL (ref 3.4–10.8)

## 2023-06-18 LAB — COMPREHENSIVE METABOLIC PANEL
ALT: 53 [IU]/L — ABNORMAL HIGH (ref 0–44)
AST: 111 [IU]/L — ABNORMAL HIGH (ref 0–40)
Albumin: 2.9 g/dL — ABNORMAL LOW (ref 3.7–4.7)
Alkaline Phosphatase: 212 [IU]/L — ABNORMAL HIGH (ref 44–121)
BUN/Creatinine Ratio: 15 (ref 10–24)
BUN: 28 mg/dL — ABNORMAL HIGH (ref 8–27)
Bilirubin Total: 0.9 mg/dL (ref 0.0–1.2)
CO2: 23 mmol/L (ref 20–29)
Calcium: 8.4 mg/dL — ABNORMAL LOW (ref 8.6–10.2)
Chloride: 106 mmol/L (ref 96–106)
Creatinine, Ser: 1.84 mg/dL — ABNORMAL HIGH (ref 0.76–1.27)
Globulin, Total: 2.4 g/dL (ref 1.5–4.5)
Glucose: 118 mg/dL — ABNORMAL HIGH (ref 70–99)
Potassium: 3.8 mmol/L (ref 3.5–5.2)
Sodium: 143 mmol/L (ref 134–144)
Total Protein: 5.3 g/dL — ABNORMAL LOW (ref 6.0–8.5)
eGFR: 35 mL/min/{1.73_m2} — ABNORMAL LOW (ref >59)

## 2023-06-18 LAB — BRAIN NATRIURETIC PEPTIDE: BNP: 299.3 pg/mL — ABNORMAL HIGH (ref 0.0–100.0)

## 2023-06-20 ENCOUNTER — Telehealth: Payer: Self-pay | Admitting: Cardiovascular Disease

## 2023-06-20 NOTE — Telephone Encounter (Signed)
Marcelino Duster from [facility] in the HIM Dept is calling stating she is returning a call she received today about a records request for this pt.   She reports the record request received from [phone] was faxed back on 11/18. [facility] records are listed under media of the patient's chart that were added today with the 11/18 date.   She reports if further records are needed past the 54 pages sent that another request can be sent to [phone], as an alternative of callback.   Please advise.

## 2023-06-20 NOTE — Telephone Encounter (Signed)
Called and spoke to [place] from [facility] states:   -Received a voicemail from office today   -received a ROI requesting records, she is not sure who requested records   -Records were sent to office and available in patient chart  Informed Marcelino Duster message sent

## 2023-06-20 NOTE — Telephone Encounter (Signed)
Yes ma'am, I did request those. Thank you so much!

## 2023-06-24 ENCOUNTER — Other Ambulatory Visit: Payer: Self-pay

## 2023-06-24 DIAGNOSIS — N1832 Chronic kidney disease, stage 3b: Secondary | ICD-10-CM

## 2023-06-24 DIAGNOSIS — I872 Venous insufficiency (chronic) (peripheral): Secondary | ICD-10-CM

## 2023-06-24 DIAGNOSIS — I5022 Chronic systolic (congestive) heart failure: Secondary | ICD-10-CM

## 2023-06-24 MED ORDER — BUMETANIDE 1 MG PO TABS: ORAL_TABLET | ORAL | 1 refills | Status: DC

## 2023-07-02 ENCOUNTER — Encounter: Payer: Self-pay | Admitting: Internal Medicine

## 2023-07-02 ENCOUNTER — Other Ambulatory Visit: Payer: Self-pay

## 2023-07-02 DIAGNOSIS — I872 Venous insufficiency (chronic) (peripheral): Secondary | ICD-10-CM

## 2023-07-02 DIAGNOSIS — N1832 Chronic kidney disease, stage 3b: Secondary | ICD-10-CM

## 2023-07-02 DIAGNOSIS — I5022 Chronic systolic (congestive) heart failure: Secondary | ICD-10-CM

## 2023-07-03 LAB — BASIC METABOLIC PANEL
BUN/Creatinine Ratio: 14 (ref 10–24)
BUN: 27 mg/dL (ref 8–27)
CO2: 27 mmol/L (ref 20–29)
Calcium: 8.5 mg/dL — ABNORMAL LOW (ref 8.6–10.2)
Chloride: 111 mmol/L — ABNORMAL HIGH (ref 96–106)
Creatinine, Ser: 1.92 mg/dL — ABNORMAL HIGH (ref 0.76–1.27)
Glucose: 138 mg/dL — ABNORMAL HIGH (ref 70–99)
Potassium: 3.7 mmol/L (ref 3.5–5.2)
Sodium: 149 mmol/L — ABNORMAL HIGH (ref 134–144)
eGFR: 34 mL/min/{1.73_m2} — ABNORMAL LOW (ref >59)

## 2023-07-05 ENCOUNTER — Ambulatory Visit: Payer: Medicare Other

## 2023-07-05 ENCOUNTER — Other Ambulatory Visit (HOSPITAL_COMMUNITY): Payer: Medicare Other

## 2023-07-07 NOTE — Progress Notes (Deleted)
Cardiology Office Note:    Date:  07/07/2023   ID:  Noah Mahoney, DOB Jan 16, 1935, MRN [id number]  PCP:  Gordan Payment., MD  Cardiologist:  Lorine Bears, MD { Click to update primary MD,subspecialty MD or APP then REFRESH:1}    Referring MD: Gordan Payment., MD   Chief Complaint: follow-up of CHF and cirrhosis  History of Present Illness:    Noah Mahoney is a 88 y.o. male with a history of CAD s/p remote CABG in 1987 with multiple subsequent PCIs to the SVG to OM (most recently an angioplasty and DES to in-stent restenosis in 10/2012), chronic HFrEF with EF as low as 25% in the past but improved to 55-60% on most recent Echo in 05/2023, paroxysmal atrial fibrillation on Eliquis, LBBB, AAA followed by Vascular Surgery, cirrhosis, hypertension, hyperlipidemia, CKD stage IIIb, and GERD who is followed by Dr. Kirke Corin and presents today for follow-up of CHF and cirrhosis.   Patient has a long history of CAD with remote CABG in 1987. Since that time he has undergone multiple PCI to the SVG to OM. Last cardiac catheterization in 10/2012 showed patent LIMA to LAD and RIMA to RCA but 99% in-stent restenosis of SVG to OM at site of recent angioplasty in 07/2012 as well as 80% stenosis of 1st Diag. He underwent successful PCI with angioplasty and DES to SVG to OM in-stent restenosis. He was hospitalized in 2019 with atrial fibrillation complicated by CHF. EF had dropped to 25% at that time. He underwent successful DCCV after being loaded with Amiodarone. Most recent Echo in 08/2018 showed LVEF of 45-50% with septal and apical hypokinesis. He also has known AAA which is followed by Vascular Surgery.  Patient called our office on 06/03/2023 and reported significant lower extremity edema and weight gain. He was advised to go to the ED. Patient was admitted at [facility] and was discharged on 06/08/2023. He was seen by me on 06/17/2023 but unfortunately I did not have records from recent  hospitalization at that time. He stated that he was told his edema was due to cirrhosis and not CHF. He continued to have significant lower extremity edema at that visit with weeping of his legs but was otherwise denied any cardiac symptoms. He did not know what medications he was discharged on at that visit. I did not feel comfortable making any changes until knowing what medications he was on and was able to review hospitalization records.   Since this visit, I did receive records from [place]. He was admitted from 06/03/2023 to 06/08/2023.  Pro BNP was normal at 407. Albumin was 2.9. LFTs were mildly elevated. Echo showed LVEF of 55-60% with grade 1 diastolic dysfunction and mildly reduced RV function. RUQ ultrasound showed cirrhotic morphology of the liver with ascities around the liver. Dr. Dulce Mahoney saw him while at [facility] and did not feel like edema was due to CHF. He felt like it was primarily due to his liver disease with some degree of chronic venous insufficiency as well. He was diuresed with IV Lasix and then discharged from the hospital on Bumex 1mg  twice daily (he was previously on Lasix 20mg  PRN). After receiving [facility] records, I reviewed with Dr. Kirke Corin who recommended increasing Bumex to 2mg  in the morning and 1mg  in the evening.   He has been seen by his PCP multiple times who also felt like this was due to cirrhosis rather than CHF. He has been referred to GI.  He was  seen in the ED on 07/08/2023 after a mechanical fall where his legs gave out on him. Labs were stable and imaging did not show any evidence of fractures so he was discharged home.   Patient presents today for follow-up. ***  Chronic HFrEF EF as low as 25% in 2019 in setting of atrial fibrillation. EF improved after restoration of sinus rhythm. EF has subsequently normalized. He was admitted in 05/2023 at [facility] for significant lower extremity edema and weight gain but this was felt to be due to cirrhosis rather than CHF.  Echo at that time showed VEF of 55-60% with grade 1 diastolic dysfunction and mildly reduced RV function. RUQ ultrasound showed cirrhotic morphology of the liver with ascities around the liver. - He continues to have significant lower extremity edema but no other signs of CHF.  - Continue Bumex 2mg  in the morning and 1mg  in the evening.  - Continue Coreg 3.125mg  twice daily.  - His lower extremity edema has been felt to be due to cirrhosis rather than CHF and his PCP is following him closely for this. He has also been referred to GI. Ideally he would be on Spironolactone with his cirrhosis but he has CKD stage IIIb (GFR 33) so will hold off on this and defer this to GI or Nephrology. ***  CAD s/p CABG History of remote CABG in 1987 with multiple subsequent PCIs to the SVG to OM (most recently a angioplasty and DES to in-stent restenosis in 10/2012). - No chest pain.  - No aspirin due to need for DOAC.  - Continue statin.    Paroxysmal Atrial Fibrillation Diagnosed in 2019. S/p successful DCCV at that time.  - Maintaining sinus rhythm on exam.  - Continue Coreg 3.125mg  twice daily.  - Previously on Amiodarone but this was reportedly stopped during recent hospitalization given cirrhosis.  - Continue Eliquis 2.5mg  twice daily. Reduced dose due to age and renal function.   AAA AAA duplex in 12/2022 showed largest aortic measurement of 4.6 cm which was essentially unchanged from prior imaging in 06/2022.  - Followed by Vascular Surgery.    Hypertension BP well controlled.  - Continue medications for CHF as above.   Hyperlipidemia Lipid panel in 11/2022: Total Cholesterol 112, Triglycerides 134, HDL 35, LDL 55. LDL goal <55 given CAD.  -  Previously on Crestor 10mg  daily but this was stopped during recent admission given CHF.  - Given his advanced age, do not think it is necessary to to be overly aggressive in treating this.    CKD Stage IIIb Baseline creatinine around 1.8 to 1.9. Creatinine  stable at 1.94 on recent labs on 07/08/2023. - Followed by Nephrology at [facility].    EKGs/Labs/Other Studies Reviewed:    The following studies were reviewed:  Echocardiogram 09/26/2018: Impression: 1. The left ventricle has mildly reduced systolic function, with an  ejection fraction of 45-50%. The cavity size was mildly dilated. There is  mildly increased left ventricular wall thickness. Left ventricular  diastolic parameters were normal.   2. Image quality poor even with definity Septal and apical hypokinesis      suggest MRI if clinically indicated to assess EF more accurately.   3. Left atrial size was moderately dilated.   4. The mitral valve is normal in structure. Mild thickening of the mitral  valve leaflet. Mild calcification of the mitral valve leaflet.   5. The tricuspid valve is normal in structure.   6. The aortic valve is  tricuspid Mild thickening of the aortic valve Mild  calcification of the aortic valve. no stenosis of the aortic valve.   7. The pulmonic valve was grossly normal. Pulmonic valve regurgitation is  mild by color flow Doppler.   8. The aortic root is normal in size and structure.  _______________   AAA Duplex 01/04/2023: Summary: Abdominal Aorta: There is evidence of abnormal dilatation of the mid  Abdominal aorta. The largest aortic measurement is 4.6 cm. The largest  aortic diameter remains essentially unchanged compared to prior exam.  Previous diameter measurement was 4.7 cm obtained on 07/03/22.  _______________  Echocardiogram 06/04/2023 [facility]): Summary: - LV: Normal size. LVEF 55-60% with mild LVH and grade 1 diastolic dysfunction.  - RV: Slightly enlarged with mildly reduced function.  - LA: Normal size. - RA: Normal size.  - Aortic valve: Mild sclerosis but no stenosis/ regurgitation.  - Mitral valve: Structurally normal. No stenosis/ regurgitation.  - Pulmonic valve: Mild regurgitation but no stenosis.  - Tricuspid  valve: Structurally normal. Mild regurgitation but no stenosis.   EKG:  EKG not ordered today.  Recent Labs: 06/17/2023: ALT 53; BNP 299.3; Hemoglobin 9.6; Platelets 140 07/02/2023: BUN 27; Creatinine, Ser 1.92; Potassium 3.7; Sodium 149  Recent Lipid Panel    Component Value Date/Time   CHOL 108 08/13/2012 0735   TRIG 127 08/13/2012 0735   HDL 25 (L) 08/13/2012 0735   CHOLHDL 4.3 08/13/2012 0735   VLDL 25 08/13/2012 0735   LDLCALC 58 08/13/2012 0735    Physical Exam:    Vital Signs: There were no vitals taken for this visit.    Wt Readings from Last 3 Encounters:  06/17/23 240 lb 6.4 oz (109 kg)  01/04/23 190 lb (86.2 kg)  12/18/22 211 lb (95.7 kg)     General: 88 y.o. male in no acute distress. HEENT: Normocephalic and atraumatic. Sclera clear.  Neck: Supple. No carotid bruits. No JVD. Heart: *** RRR. Distinct S1 and S2. No murmurs, gallops, or rubs.  Lungs: No increased work of breathing. Clear to ausculation bilaterally. No wheezes, rhonchi, or rales.  Abdomen: Soft, non-distended, and non-tender to palpation.  Extremities: No lower extremity edema.  Radial and distal pedal pulses 2+ and equal bilaterally. Skin: Warm and dry. Neuro: No focal deficits. Psych: Normal affect. Responds appropriately.   Assessment:    No diagnosis found.  Plan:     Disposition: Follow up in ***   Signed, Corrin Parker, PA-C  07/07/2023 3:35 PM    [facility]

## 2023-07-08 ENCOUNTER — Encounter (HOSPITAL_COMMUNITY): Payer: Self-pay | Admitting: Emergency Medicine

## 2023-07-08 ENCOUNTER — Emergency Department (HOSPITAL_COMMUNITY): Payer: Medicare Other

## 2023-07-08 ENCOUNTER — Other Ambulatory Visit: Payer: Self-pay

## 2023-07-08 ENCOUNTER — Emergency Department (HOSPITAL_COMMUNITY)
Admission: EM | Admit: 2023-07-08 | Discharge: 2023-07-08 | Disposition: A | Discharge status: Home / Self Care | Payer: Medicare Other | Attending: Emergency Medicine | Admitting: Emergency Medicine

## 2023-07-08 DIAGNOSIS — W01198A Fall on same level from slipping, tripping and stumbling with subsequent striking against other object, initial encounter: Secondary | ICD-10-CM | POA: Diagnosis not present

## 2023-07-08 DIAGNOSIS — Z85528 Personal history of other malignant neoplasm of kidney: Secondary | ICD-10-CM | POA: Diagnosis not present

## 2023-07-08 DIAGNOSIS — Z951 Presence of aortocoronary bypass graft: Secondary | ICD-10-CM | POA: Insufficient documentation

## 2023-07-08 DIAGNOSIS — R519 Headache, unspecified: Secondary | ICD-10-CM | POA: Diagnosis present

## 2023-07-08 DIAGNOSIS — Z7901 Long term (current) use of anticoagulants: Secondary | ICD-10-CM | POA: Diagnosis not present

## 2023-07-08 DIAGNOSIS — Z79899 Other long term (current) drug therapy: Secondary | ICD-10-CM | POA: Insufficient documentation

## 2023-07-08 DIAGNOSIS — M545 Low back pain, unspecified: Secondary | ICD-10-CM | POA: Diagnosis not present

## 2023-07-08 DIAGNOSIS — R600 Localized edema: Secondary | ICD-10-CM | POA: Diagnosis not present

## 2023-07-08 DIAGNOSIS — S0003XA Contusion of scalp, initial encounter: Secondary | ICD-10-CM | POA: Diagnosis not present

## 2023-07-08 DIAGNOSIS — W19XXXA Unspecified fall, initial encounter: Secondary | ICD-10-CM

## 2023-07-08 DIAGNOSIS — I129 Hypertensive chronic kidney disease with stage 1 through stage 4 chronic kidney disease, or unspecified chronic kidney disease: Secondary | ICD-10-CM | POA: Insufficient documentation

## 2023-07-08 DIAGNOSIS — Y9301 Activity, walking, marching and hiking: Secondary | ICD-10-CM | POA: Diagnosis not present

## 2023-07-08 DIAGNOSIS — N189 Chronic kidney disease, unspecified: Secondary | ICD-10-CM | POA: Diagnosis not present

## 2023-07-08 LAB — CBC WITH DIFFERENTIAL/PLATELET
Abs Immature Granulocytes: 0.02 10*3/uL (ref 0.00–0.07)
Basophils Absolute: 0 10*3/uL (ref 0.0–0.1)
Basophils Relative: 0 %
Eosinophils Absolute: 0.1 10*3/uL (ref 0.0–0.5)
Eosinophils Relative: 2 %
HCT: 30.2 % — ABNORMAL LOW (ref 39.0–52.0)
Hemoglobin: 9.6 g/dL — ABNORMAL LOW (ref 13.0–17.0)
Immature Granulocytes: 0 %
Lymphocytes Relative: 19 %
Lymphs Abs: 1 10*3/uL (ref 0.7–4.0)
MCH: 33.3 pg (ref 26.0–34.0)
MCHC: 31.8 g/dL (ref 30.0–36.0)
MCV: 104.9 fL — ABNORMAL HIGH (ref 80.0–100.0)
Monocytes Absolute: 0.8 10*3/uL (ref 0.1–1.0)
Monocytes Relative: 15 %
Neutro Abs: 3.5 10*3/uL (ref 1.7–7.7)
Neutrophils Relative %: 64 %
Platelets: 133 10*3/uL — ABNORMAL LOW (ref 150–400)
RBC: 2.88 MIL/uL — ABNORMAL LOW (ref 4.22–5.81)
RDW: 14.4 % (ref 11.5–15.5)
WBC: 5.4 10*3/uL (ref 4.0–10.5)
nRBC: 0 % (ref 0.0–0.2)

## 2023-07-08 LAB — BASIC METABOLIC PANEL
Anion gap: 9 (ref 5–15)
BUN: 30 mg/dL — ABNORMAL HIGH (ref 8–23)
CO2: 22 mmol/L (ref 22–32)
Calcium: 8.2 mg/dL — ABNORMAL LOW (ref 8.9–10.3)
Chloride: 107 mmol/L (ref 98–111)
Creatinine, Ser: 1.94 mg/dL — ABNORMAL HIGH (ref 0.61–1.24)
GFR, Estimated: 33 mL/min — ABNORMAL LOW (ref >60)
Glucose, Bld: 149 mg/dL — ABNORMAL HIGH (ref 70–99)
Potassium: 3.6 mmol/L (ref 3.5–5.1)
Sodium: 138 mmol/L (ref 135–145)

## 2023-07-08 LAB — CBG MONITORING, ED: Glucose-Capillary: 137 mg/dL — ABNORMAL HIGH (ref 70–99)

## 2023-07-08 MED ORDER — CARVEDILOL 3.125 MG PO TABS: 3.1250 mg | ORAL_TABLET | Freq: Two times a day (BID) | ORAL | 0 refills | Status: AC

## 2023-07-08 NOTE — ED Triage Notes (Signed)
Pt reports while walking in his carport this morning his knees "gave out." Pt also reports hitting his head.

## 2023-07-08 NOTE — ED Notes (Signed)
Pt activated as a level 2 trauma because he takes elequis.

## 2023-07-08 NOTE — ED Notes (Signed)
Trauma Response Nurse Documentation   Noah Mahoney is a 88 y.o. male arriving to [facility] ED via EMS  On Eliquis (apixaban) daily. Trauma was activated as a Level 2 by ED RN based on the following trauma criteria Elderly patients > 65 with head trauma on anti-coagulation (excluding ASA).  Patient cleared for CT by Dr. Jodi Mourning. Pt transported to CT with trauma response nurse present to monitor. RN remained with the patient throughout their absence from the department for clinical observation.   GCS 15.  History   Past Medical History:  Diagnosis Date   AAA (abdominal aortic aneurysm) without rupture (HCC)    Coronary artery disease    a. s/p CABG 1987;  b. s/p DES x 2 to SVG in 2005; c. s/p DES x 2 to SVG in 2007;  d. 2 DES placed to SVG and OM1 in 2010 ;  e. s/p PTCA mid body of SVG to OM1/OM2 due to ISR 08/13/12;  f. 10/2012 NSTEMI/Cath/PCI: native 3vd, VG->OM 99isr (3.0x16 Promus DES), LIMA->LAD nl, RIMA->RCA nl, EF 50%.   Diabetes mellitus, type 2 (HCC)    Dyslipidemia (high LDL; low HDL)    Gout    Hyperlipidemia    Hypertension    Myositis    Osteoarthritis    Osteoporosis    Paroxysmal supraventricular tachycardia (HCC)      Past Surgical History:  Procedure Laterality Date   CARDIAC SURGERY     CARDIOVERSION N/A 12/19/2017   Procedure: CARDIOVERSION;  Surgeon: Jake Bathe, MD;  Location: [facility] ENDOSCOPY;  Service: Cardiovascular;  Laterality: N/A;   CORONARY STENT PLACEMENT     EYE SURGERY Right March 2016   Cataract   LEFT HEART CATHETERIZATION WITH CORONARY ANGIOGRAM N/A 08/13/2012   Procedure: LEFT HEART CATHETERIZATION WITH CORONARY ANGIOGRAM;  Surgeon: Kathleene Hazel, MD;  Location: [facility] CATH LAB;  Service: Cardiovascular;  Laterality: N/A;   LEFT HEART CATHETERIZATION WITH CORONARY/GRAFT ANGIOGRAM N/A 11/19/2012   Procedure: LEFT HEART CATHETERIZATION WITH Isabel Caprice;  Surgeon: Iran Ouch, MD;  Location: [facility] CATH LAB;  Service:  Cardiovascular;  Laterality: N/A;   PROSTATE SURGERY        Initial Focused Assessment (If applicable, or please see trauma documentation): Airway: intact, patent Breathing: Breath sounds clear, equal bilaterally Circulation: No external signs of hemorrhage, approx 2 cm hematoma to L lateral aspect of head. Pulses intact centrally and peripherally  PERRLA C/o sacral tenderness.   CT's Completed:   CT Head and CT C-Spine   Interventions:  C-collar applied Pelvic XR XR of Sacrum/coccyx  CT head and c-spine 18G PIV to L AC Pt logrolled, spine assessed   Plan for disposition:  Discharge home  Consults completed:  none at 1340.  Event Summary: Pt was in his carport walking when his knees gave out and he fell to the ground.  Pt states he did hit his head but had no LOC.  Pt is on Eliquis.  Pt c/o tailbone pain only.  HOH. Left his hearing aids at home.  Pt states that he lives at home alone and generally uses a walker and/or cane.  Pt states that he is supposed to be moving in with his nephew next week but the pt has concerns that he may be "too much for him to care for"   Pt also states that he has been having some diarrhea recently.  Pt also has mid cervical joint pain that is not from this fall.  Pt states that  he had open heart surgery in 1987 and quit smoking at this time.  Pt does not drink or do drugs.  Pt does state that he has cirrhosis of the liver and may need it to be drained soon.   Bedside handoff with ED RN Marchelle Folks.    Janora Norlander  Trauma Response RN  Please call TRN at [phone] for further assistance.

## 2023-07-08 NOTE — Discharge Instructions (Addendum)
You were seen here today for a fall.  Your workup showed no broken bone in your head or neck or bleeding in your brain, or broken tailbone.  Please use all medications as prescribed.  Please follow-up with your PCP.  Please return to the emergency department for chest pain, shortness of breath, severe vomiting or inability to keep down food, loss of consciousness, or any worsening symptom or concern.

## 2023-07-08 NOTE — ED Provider Notes (Signed)
[address] EMERGENCY DEPARTMENT AT [facility] Provider Note   CSN: [id number] Arrival date & time: 07/08/23  1110     History  Chief Complaint  Patient presents with   Noah Mahoney is a 88 y.o. male with a PMH of renal cell carcinoma, A-fib on Eliquis, CKD, AAA, prior CABG, HTN who presented to the ED for a fall.  Patient reports his knees gave out from under him and he fell, hitting his head and landing on his tailbone.  Denies loss of consciousness, endorses taking Eliquis.  He is currently complaining of pain in his tailbone, but also reports that he has had this for a long time.  Denies pain in his neck, back, extremities.   Fall       Home Medications Prior to Admission medications   Medication Sig Start Date End Date Taking? Authorizing Provider  bumetanide (BUMEX) 1 MG tablet Take 2 tablets (2 mg total) by mouth in the morning AND 1 tablet (1 mg total) every evening. 06/24/23   Marjie Skiff E, PA-C  carvedilol (COREG) 3.125 MG tablet Take 3.125 mg by mouth 2 (two) times daily with a meal.    [provider]  ELIQUIS 2.5 MG TABS tablet TAKE 1 TABLET BY MOUTH TWICE A DAY 01/30/23   Iran Ouch, MD  ferrous sulfate 325 (65 FE) MG tablet Take by mouth. Patient not taking: Reported on 06/17/2023    [provider]  Glucosamine Sulfate 1500 MG PACK Take by mouth. Patient not taking: Reported on 06/17/2023    [provider]  hydrALAZINE (APRESOLINE) 25 MG tablet TAKE 1 TABLET BY MOUTH TWICE A DAY 02/26/23   Iran Ouch, MD  loratadine (CLARITIN) 10 MG tablet Take by mouth. Patient not taking: Reported on 06/17/2023    [provider]  Misc Natural Products (OSTEO BI-FLEX/5-LOXIN ADVANCED) TABS Take 2 tablets by mouth daily. Patient not taking: Reported on 06/17/2023    [provider]  nitroGLYCERIN (NITROSTAT) 0.4 MG SL tablet Place 1 tablet (0.4 mg total) under the tongue every 5 (five) minutes  as needed. For chest pain Patient not taking: Reported on 06/17/2023 06/05/22   Iran Ouch, MD  Omega-3 Fatty Acids (FISH OIL PO) Take 2 capsules by mouth daily.  Patient not taking: Reported on 06/17/2023    [provider]  rosuvastatin (CRESTOR) 10 MG tablet TAKE 1 TABLET BY MOUTH EVERY DAY Patient not taking: Reported on 06/17/2023 07/19/20   Iran Ouch, MD  sitaGLIPtin (JANUVIA) 25 MG tablet Take 1 tablet by mouth daily. Patient not taking: Reported on 06/17/2023 09/28/21   [provider]      Allergies    Lipitor [atorvastatin], Metformin, Morphine and codeine, Pravastatin sodium, and Niaspan [niacin er (antihyperlipidemic)]    Review of Systems   Review of Systems  Physical Exam Updated Vital Signs BP 101/64   Pulse 64   Temp 97.7 F (36.5 C) (Oral)   Resp 15   Ht 5\' 10"  (1.778 m)   Wt 109 kg   SpO2 99%   BMI 34.49 kg/m  Physical Exam HENT:     Head: Normocephalic.     Comments: Small left parietal hematoma without overlying laceration or abrasion    Nose: Nose normal.     Mouth/Throat:     Mouth: Mucous membranes are moist.     Pharynx: Oropharynx is clear.  Eyes:     Pupils: Pupils are equal, round,  and reactive to light.  Neck:     Comments: C-collar in place.  No midline C, T, L-spine tenderness palpation.  Tenderness to palpation at the coccyx Cardiovascular:     Rate and Rhythm: Normal rate and regular rhythm.     Heart sounds: Normal heart sounds. No murmur heard.    No friction rub. No gallop.  Pulmonary:     Effort: Pulmonary effort is normal.     Breath sounds: Normal breath sounds. No stridor. No wheezing, rhonchi or rales.  Abdominal:     Palpations: Abdomen is soft.     Tenderness: There is no abdominal tenderness. There is no guarding or rebound.  Musculoskeletal:     Right lower leg: Edema present.     Left lower leg: Edema present.     Comments: Compression stockings in place with lower extremity edema  bilaterally.  Sensation and motor function intact in all 4 extremities.  Bilateral upper extremities with scattered bruising that appears to be chronic.  Bilateral radial pulses 2+  Skin:    General: Skin is warm and dry.  Neurological:     General: No focal deficit present.     Mental Status: He is alert.     ED Results / Procedures / Treatments   Labs (all labs ordered are listed, but only abnormal results are displayed) Labs Reviewed  CBC WITH DIFFERENTIAL/PLATELET - Abnormal; Notable for the following components:      Result Value   RBC 2.88 (*)    Hemoglobin 9.6 (*)    HCT 30.2 (*)    MCV 104.9 (*)    Platelets 133 (*)    All other components within normal limits  BASIC METABOLIC PANEL - Abnormal; Notable for the following components:   Glucose, Bld 149 (*)    BUN 30 (*)    Creatinine, Ser 1.94 (*)    Calcium 8.2 (*)    GFR, Estimated 33 (*)    All other components within normal limits  CBG MONITORING, ED - Abnormal; Notable for the following components:   Glucose-Capillary 137 (*)    All other components within normal limits    EKG None  Radiology DG Sacrum/Coccyx  Result Date: 07/08/2023 CLINICAL DATA:  Tail bone pain after fall. EXAM: SACRUM AND COCCYX - 2+ VIEW COMPARISON:  None Available. FINDINGS: There is no evidence of fracture or other focal bone lesions. IMPRESSION: Negative. Electronically Signed   By: Lupita Raider M.D.   On: 07/08/2023 14:27   DG Pelvis Portable  Result Date: 07/08/2023 CLINICAL DATA:  Tail bone pain after fall. EXAM: PORTABLE PELVIS 1-2 VIEWS COMPARISON:  None Available. FINDINGS: There is no evidence of pelvic fracture or diastasis. No pelvic bone lesions are seen. IMPRESSION: Negative. Electronically Signed   By: Lupita Raider M.D.   On: 07/08/2023 14:26   CT Head Wo Contrast  Result Date: 07/08/2023 CLINICAL DATA:  Head trauma, minor (Age >= 65y); Neck trauma (Age >= 65y). Fall with head strike. EXAM: CT HEAD WITHOUT CONTRAST  CT CERVICAL SPINE WITHOUT CONTRAST TECHNIQUE: Multidetector CT imaging of the head and cervical spine was performed following the standard protocol without intravenous contrast. Multiplanar CT image reconstructions of the cervical spine were also generated. RADIATION DOSE REDUCTION: This exam was performed according to the departmental dose-optimization program which includes automated exposure control, adjustment of the mA and/or kV according to patient size and/or use of iterative reconstruction technique. COMPARISON:  None Available. FINDINGS: CT HEAD FINDINGS Brain: No  acute hemorrhage. Cortical gray-white differentiation is preserved. Patchy hypoattenuation of the cerebral white matter, most consistent with mild chronic small-vessel disease. Prominence of the ventricles and sulci within normal limits for age. No extra-axial collection. Basilar cisterns are patent. Vascular: No hyperdense vessel or unexpected calcification. Skull: No calvarial fracture or suspicious bone lesion. Skull base is unremarkable. Sinuses/Orbits: No acute finding. Other: Left parietal scalp contusion. CT CERVICAL SPINE FINDINGS Alignment: Normal. Skull base and vertebrae: No acute fracture. Moderate degenerative changes of the craniocervical junction. No suspicious bone lesions. Soft tissues and spinal canal: No prevertebral fluid or swelling. No visible canal hematoma. Disc levels: Multilevel cervical spondylosis without high-grade spinal canal stenosis. Upper chest: Severe emphysema in the lung apices. Other: None. IMPRESSION: 1. No acute intracranial abnormality. Left parietal scalp contusion. 2. No acute cervical spine fracture or traumatic listhesis. Emphysema (ICD10-J43.9). Electronically Signed   By: Orvan Falconer M.D.   On: 07/08/2023 13:17   CT Cervical Spine Wo Contrast  Result Date: 07/08/2023 CLINICAL DATA:  Head trauma, minor (Age >= 65y); Neck trauma (Age >= 65y). Fall with head strike. EXAM: CT HEAD WITHOUT CONTRAST  CT CERVICAL SPINE WITHOUT CONTRAST TECHNIQUE: Multidetector CT imaging of the head and cervical spine was performed following the standard protocol without intravenous contrast. Multiplanar CT image reconstructions of the cervical spine were also generated. RADIATION DOSE REDUCTION: This exam was performed according to the departmental dose-optimization program which includes automated exposure control, adjustment of the mA and/or kV according to patient size and/or use of iterative reconstruction technique. COMPARISON:  None Available. FINDINGS: CT HEAD FINDINGS Brain: No acute hemorrhage. Cortical gray-white differentiation is preserved. Patchy hypoattenuation of the cerebral white matter, most consistent with mild chronic small-vessel disease. Prominence of the ventricles and sulci within normal limits for age. No extra-axial collection. Basilar cisterns are patent. Vascular: No hyperdense vessel or unexpected calcification. Skull: No calvarial fracture or suspicious bone lesion. Skull base is unremarkable. Sinuses/Orbits: No acute finding. Other: Left parietal scalp contusion. CT CERVICAL SPINE FINDINGS Alignment: Normal. Skull base and vertebrae: No acute fracture. Moderate degenerative changes of the craniocervical junction. No suspicious bone lesions. Soft tissues and spinal canal: No prevertebral fluid or swelling. No visible canal hematoma. Disc levels: Multilevel cervical spondylosis without high-grade spinal canal stenosis. Upper chest: Severe emphysema in the lung apices. Other: None. IMPRESSION: 1. No acute intracranial abnormality. Left parietal scalp contusion. 2. No acute cervical spine fracture or traumatic listhesis. Emphysema (ICD10-J43.9). Electronically Signed   By: Orvan Falconer M.D.   On: 07/08/2023 13:17    Procedures Procedures    Medications Ordered in ED Medications - No data to display  ED Course/ Medical Decision Making/ A&P                                 Medical Decision  Making Amount and/or Complexity of Data Reviewed Labs: ordered. Radiology: ordered.   Vital signs stable, physical exam with coccyx tenderness but no other obvious trauma.  BMP with creatinine 1.94, around his baseline.  CBC with hemoglobin 9.6, consistent with prior 3 weeks ago.CT head and C-spine showed no acute traumatic injuries.  X-ray of the pelvis and coccyx without coccyx or pelvic fractures.  I assisted the patient's primary RN and ambulating the patient, he is appropriate to ambulate with a walker.  He also reports that his nephew has a walker and a wheelchair at home for him that he can use if  needed.  I talked to the patient's nephew via phone call, he reports patient has had difficulty walking due to the leg swelling and has been wearing compression stockings.  He reports he has been compliant with his Bumex.  I encouraged the patient and his nephew to follow-up with the patient's PCP regarding his leg swelling.  I do not suspect CHF exacerbation at this time as patient does not have symptoms including shortness of breath, chest pain and has remained stable on room air.  Results of laboratory and imaging evaluations were discussed with the patient and nephew.  Strict return precautions were discussed, and the patient voiced understanding.  Patient was discharged in stable condition.        Final Clinical Impression(s) / ED Diagnoses Final diagnoses:  Fall, initial encounter    Rx / DC Orders ED Discharge Orders     None         Janyth Pupa, MD 07/08/23 1641    Blane Ohara, MD 07/09/23 [phone]

## 2023-07-18 ENCOUNTER — Ambulatory Visit: Payer: Medicare Other | Admitting: Student

## 2023-07-26 ENCOUNTER — Other Ambulatory Visit: Payer: Self-pay | Admitting: Student

## 2023-07-28 ENCOUNTER — Other Ambulatory Visit: Payer: Self-pay | Admitting: Cardiovascular Disease

## 2023-07-29 NOTE — Telephone Encounter (Signed)
Refill Request.  

## 2023-08-02 ENCOUNTER — Ambulatory Visit (HOSPITAL_COMMUNITY): Payer: Medicare Other

## 2023-08-02 ENCOUNTER — Ambulatory Visit: Payer: Medicare Other

## 2023-08-04 ENCOUNTER — Other Ambulatory Visit: Payer: Self-pay | Admitting: Cardiovascular Disease

## 2023-08-05 NOTE — Telephone Encounter (Signed)
 Prescription refill request for Eliquis received. Indication:afib Last office visit:11/24 Scr:1.94  12/24 Age: 89 Weight:109  kg  Prescription refilled
